# Patient Record
Sex: Female | Born: 1945 | Race: White | Hispanic: No | Marital: Married | Smoking: Current every day smoker
Health system: Southern US, Community
[De-identification: ages and names within clinical notes are randomized; demographics above are authoritative.]

## PROBLEM LIST (undated history)

## (undated) DIAGNOSIS — D1803 Hemangioma of intra-abdominal structures: Secondary | ICD-10-CM

## (undated) DIAGNOSIS — M810 Age-related osteoporosis without current pathological fracture: Secondary | ICD-10-CM

## (undated) DIAGNOSIS — K760 Fatty (change of) liver, not elsewhere classified: Secondary | ICD-10-CM

## (undated) DIAGNOSIS — G500 Trigeminal neuralgia: Secondary | ICD-10-CM

## (undated) DIAGNOSIS — M8580 Other specified disorders of bone density and structure, unspecified site: Secondary | ICD-10-CM

## (undated) DIAGNOSIS — E785 Hyperlipidemia, unspecified: Secondary | ICD-10-CM

## (undated) DIAGNOSIS — K219 Gastro-esophageal reflux disease without esophagitis: Secondary | ICD-10-CM

## (undated) DIAGNOSIS — F419 Anxiety disorder, unspecified: Secondary | ICD-10-CM

## (undated) DIAGNOSIS — E079 Disorder of thyroid, unspecified: Secondary | ICD-10-CM

## (undated) HISTORY — DX: Age-related osteoporosis without current pathological fracture: M81.0

## (undated) HISTORY — PX: TONSILLECTOMY: SUR1361

## (undated) HISTORY — PX: RETINAL DETACHMENT SURGERY: SHX105

## (undated) HISTORY — PX: CATARACT EXTRACTION: SUR2

## (undated) HISTORY — DX: Gastro-esophageal reflux disease without esophagitis: K21.9

## (undated) HISTORY — PX: CHOLECYSTECTOMY: SHX55

## (undated) HISTORY — PX: ABDOMINAL HYSTERECTOMY: SHX81

## (undated) HISTORY — DX: Hyperlipidemia, unspecified: E78.5

## (undated) HISTORY — DX: Hemangioma of intra-abdominal structures: D18.03

## (undated) HISTORY — DX: Fatty (change of) liver, not elsewhere classified: K76.0

## (undated) HISTORY — DX: Other specified disorders of bone density and structure, unspecified site: M85.80

## (undated) HISTORY — PX: EAR PINNA RECONSTRUCTION W/ RIB GRAFT: SHX1484

## (undated) HISTORY — PX: BREAST REDUCTION SURGERY: SHX8

## (undated) HISTORY — PX: OTHER SURGICAL HISTORY: SHX169

---

## 1995-03-08 DIAGNOSIS — G500 Trigeminal neuralgia: Secondary | ICD-10-CM

## 1995-03-08 HISTORY — DX: Trigeminal neuralgia: G50.0

## 2003-09-16 ENCOUNTER — Encounter (INDEPENDENT_AMBULATORY_CARE_PROVIDER_SITE_OTHER): Payer: Self-pay | Admitting: Family Medicine

## 2005-06-30 ENCOUNTER — Ambulatory Visit: Payer: Self-pay | Admitting: Family Medicine

## 2005-07-14 ENCOUNTER — Ambulatory Visit: Payer: Self-pay | Admitting: Family Medicine

## 2005-08-19 ENCOUNTER — Ambulatory Visit: Payer: Self-pay | Admitting: Family Medicine

## 2005-09-02 ENCOUNTER — Encounter (INDEPENDENT_AMBULATORY_CARE_PROVIDER_SITE_OTHER): Payer: Self-pay | Admitting: Family Medicine

## 2005-09-21 ENCOUNTER — Encounter (INDEPENDENT_AMBULATORY_CARE_PROVIDER_SITE_OTHER): Payer: Self-pay | Admitting: Family Medicine

## 2005-09-22 ENCOUNTER — Ambulatory Visit: Payer: Self-pay | Admitting: Family Medicine

## 2005-10-20 ENCOUNTER — Ambulatory Visit: Payer: Self-pay | Admitting: Family Medicine

## 2006-03-07 HISTORY — PX: COLONOSCOPY: SHX174

## 2006-03-07 HISTORY — PX: ESOPHAGOGASTRODUODENOSCOPY: SHX1529

## 2007-01-25 ENCOUNTER — Telehealth (INDEPENDENT_AMBULATORY_CARE_PROVIDER_SITE_OTHER): Payer: Self-pay | Admitting: *Deleted

## 2007-01-25 ENCOUNTER — Ambulatory Visit: Payer: Self-pay | Admitting: Family Medicine

## 2007-01-25 ENCOUNTER — Encounter (INDEPENDENT_AMBULATORY_CARE_PROVIDER_SITE_OTHER): Payer: Self-pay | Admitting: Internal Medicine

## 2007-01-25 DIAGNOSIS — R5381 Other malaise: Secondary | ICD-10-CM | POA: Insufficient documentation

## 2007-01-25 DIAGNOSIS — R5383 Other fatigue: Secondary | ICD-10-CM

## 2007-01-25 DIAGNOSIS — G500 Trigeminal neuralgia: Secondary | ICD-10-CM | POA: Insufficient documentation

## 2007-01-25 DIAGNOSIS — H269 Unspecified cataract: Secondary | ICD-10-CM

## 2007-01-25 DIAGNOSIS — K219 Gastro-esophageal reflux disease without esophagitis: Secondary | ICD-10-CM

## 2007-01-25 DIAGNOSIS — E785 Hyperlipidemia, unspecified: Secondary | ICD-10-CM | POA: Insufficient documentation

## 2007-01-25 DIAGNOSIS — J309 Allergic rhinitis, unspecified: Secondary | ICD-10-CM | POA: Insufficient documentation

## 2007-01-25 DIAGNOSIS — E039 Hypothyroidism, unspecified: Secondary | ICD-10-CM | POA: Insufficient documentation

## 2007-01-25 DIAGNOSIS — R32 Unspecified urinary incontinence: Secondary | ICD-10-CM | POA: Insufficient documentation

## 2007-01-25 DIAGNOSIS — G43909 Migraine, unspecified, not intractable, without status migrainosus: Secondary | ICD-10-CM | POA: Insufficient documentation

## 2007-01-25 HISTORY — DX: Gastro-esophageal reflux disease without esophagitis: K21.9

## 2007-01-25 LAB — CONVERTED CEMR LAB: LDL Goal: 130 mg/dL

## 2007-01-29 ENCOUNTER — Encounter (INDEPENDENT_AMBULATORY_CARE_PROVIDER_SITE_OTHER): Payer: Self-pay | Admitting: Family Medicine

## 2007-01-30 LAB — CONVERTED CEMR LAB
ALT: 20 units/L (ref 0–35)
Albumin: 4.1 g/dL (ref 3.5–5.2)
Alkaline Phosphatase: 94 units/L (ref 39–117)
BUN: 13 mg/dL (ref 6–23)
CO2: 21 meq/L (ref 19–32)
Calcium: 9 mg/dL (ref 8.4–10.5)
Chloride: 108 meq/L (ref 96–112)
Cholesterol: 245 mg/dL — ABNORMAL HIGH (ref 0–200)
Creatinine, Ser: 0.84 mg/dL (ref 0.40–1.20)
Glucose, Bld: 92 mg/dL (ref 70–99)
HDL: 49 mg/dL (ref >39)
TSH: 1.818 microintl units/mL (ref 0.350–5.50)
Total CHOL/HDL Ratio: 5
Total Protein: 6.5 g/dL (ref 6.0–8.3)
VLDL: 24 mg/dL (ref 0–40)

## 2007-01-31 ENCOUNTER — Telehealth (INDEPENDENT_AMBULATORY_CARE_PROVIDER_SITE_OTHER): Payer: Self-pay | Admitting: *Deleted

## 2007-01-31 ENCOUNTER — Encounter (INDEPENDENT_AMBULATORY_CARE_PROVIDER_SITE_OTHER): Payer: Self-pay | Admitting: Family Medicine

## 2007-02-06 ENCOUNTER — Ambulatory Visit: Payer: Self-pay | Admitting: Gastroenterology

## 2007-02-12 ENCOUNTER — Ambulatory Visit (HOSPITAL_COMMUNITY): Admission: RE | Admit: 2007-02-12 | Discharge: 2007-02-12 | Payer: Self-pay | Admitting: Family Medicine

## 2007-02-13 ENCOUNTER — Ambulatory Visit: Payer: Self-pay | Admitting: Gastroenterology

## 2007-02-13 ENCOUNTER — Ambulatory Visit (HOSPITAL_COMMUNITY): Admission: RE | Admit: 2007-02-13 | Discharge: 2007-02-13 | Payer: Self-pay | Admitting: Gastroenterology

## 2007-02-13 ENCOUNTER — Encounter: Payer: Self-pay | Admitting: Gastroenterology

## 2007-02-13 ENCOUNTER — Encounter (INDEPENDENT_AMBULATORY_CARE_PROVIDER_SITE_OTHER): Payer: Self-pay | Admitting: Family Medicine

## 2007-02-14 ENCOUNTER — Encounter (INDEPENDENT_AMBULATORY_CARE_PROVIDER_SITE_OTHER): Payer: Self-pay | Admitting: Family Medicine

## 2007-02-14 ENCOUNTER — Ambulatory Visit (HOSPITAL_COMMUNITY): Admission: RE | Admit: 2007-02-14 | Discharge: 2007-02-14 | Payer: Self-pay | Admitting: Family Medicine

## 2007-02-15 ENCOUNTER — Ambulatory Visit: Payer: Self-pay | Admitting: Family Medicine

## 2007-02-15 ENCOUNTER — Telehealth (INDEPENDENT_AMBULATORY_CARE_PROVIDER_SITE_OTHER): Payer: Self-pay | Admitting: *Deleted

## 2007-02-15 DIAGNOSIS — K2970 Gastritis, unspecified, without bleeding: Secondary | ICD-10-CM | POA: Insufficient documentation

## 2007-02-15 DIAGNOSIS — M899 Disorder of bone, unspecified: Secondary | ICD-10-CM | POA: Insufficient documentation

## 2007-02-15 DIAGNOSIS — K2990 Gastroduodenitis, unspecified, without bleeding: Secondary | ICD-10-CM

## 2007-02-15 DIAGNOSIS — M949 Disorder of cartilage, unspecified: Secondary | ICD-10-CM

## 2007-02-22 ENCOUNTER — Encounter (INDEPENDENT_AMBULATORY_CARE_PROVIDER_SITE_OTHER): Payer: Self-pay | Admitting: Family Medicine

## 2007-02-28 ENCOUNTER — Telehealth (INDEPENDENT_AMBULATORY_CARE_PROVIDER_SITE_OTHER): Payer: Self-pay | Admitting: *Deleted

## 2007-03-05 ENCOUNTER — Encounter (INDEPENDENT_AMBULATORY_CARE_PROVIDER_SITE_OTHER): Payer: Self-pay | Admitting: Family Medicine

## 2007-03-08 HISTORY — PX: COLONOSCOPY WITH ESOPHAGOGASTRODUODENOSCOPY (EGD): SHX5779

## 2007-06-20 ENCOUNTER — Encounter (INDEPENDENT_AMBULATORY_CARE_PROVIDER_SITE_OTHER): Payer: Self-pay | Admitting: Family Medicine

## 2007-08-29 ENCOUNTER — Telehealth (INDEPENDENT_AMBULATORY_CARE_PROVIDER_SITE_OTHER): Payer: Self-pay | Admitting: *Deleted

## 2007-08-30 ENCOUNTER — Ambulatory Visit: Payer: Self-pay | Admitting: Family Medicine

## 2007-08-30 LAB — CONVERTED CEMR LAB: OCCULT 1: POSITIVE

## 2007-09-05 LAB — CONVERTED CEMR LAB: Tissue Transglutaminase Ab, IgA: 0.1 units (ref <7)

## 2007-11-05 ENCOUNTER — Ambulatory Visit: Payer: Self-pay | Admitting: Family Medicine

## 2007-11-05 ENCOUNTER — Encounter: Payer: Self-pay | Admitting: Orthopedic Surgery

## 2007-11-05 ENCOUNTER — Ambulatory Visit (HOSPITAL_COMMUNITY): Admission: RE | Admit: 2007-11-05 | Discharge: 2007-11-05 | Payer: Self-pay | Admitting: Family Medicine

## 2007-11-15 ENCOUNTER — Ambulatory Visit: Payer: Self-pay | Admitting: Gastroenterology

## 2007-11-21 ENCOUNTER — Encounter (INDEPENDENT_AMBULATORY_CARE_PROVIDER_SITE_OTHER): Payer: Self-pay | Admitting: Family Medicine

## 2007-11-28 ENCOUNTER — Ambulatory Visit: Payer: Self-pay | Admitting: Orthopedic Surgery

## 2007-11-28 DIAGNOSIS — M1610 Unilateral primary osteoarthritis, unspecified hip: Secondary | ICD-10-CM | POA: Insufficient documentation

## 2007-11-28 DIAGNOSIS — M169 Osteoarthritis of hip, unspecified: Secondary | ICD-10-CM

## 2007-12-05 ENCOUNTER — Encounter (INDEPENDENT_AMBULATORY_CARE_PROVIDER_SITE_OTHER): Payer: Self-pay | Admitting: Family Medicine

## 2007-12-05 ENCOUNTER — Encounter: Payer: Self-pay | Admitting: Gastroenterology

## 2007-12-05 ENCOUNTER — Ambulatory Visit (HOSPITAL_COMMUNITY): Admission: RE | Admit: 2007-12-05 | Discharge: 2007-12-05 | Payer: Self-pay | Admitting: Gastroenterology

## 2007-12-05 ENCOUNTER — Ambulatory Visit: Payer: Self-pay | Admitting: Gastroenterology

## 2008-01-28 ENCOUNTER — Ambulatory Visit: Payer: Self-pay | Admitting: Family Medicine

## 2008-01-30 LAB — CONVERTED CEMR LAB
ALT: 23 units/L (ref 0–35)
AST: 16 units/L (ref 0–37)
Basophils Absolute: 0 10*3/uL (ref 0.0–0.1)
CO2: 22 meq/L (ref 19–32)
Calcium: 9.6 mg/dL (ref 8.4–10.5)
Cholesterol: 227 mg/dL — ABNORMAL HIGH (ref 0–200)
Glucose, Bld: 101 mg/dL — ABNORMAL HIGH (ref 70–99)
HDL: 45 mg/dL (ref >39)
LDL Cholesterol: 156 mg/dL — ABNORMAL HIGH (ref 0–99)
Lymphocytes Relative: 30 % (ref 12–46)
Lymphs Abs: 1.4 10*3/uL (ref 0.7–4.0)
MCV: 94.1 fL (ref 78.0–100.0)
Monocytes Absolute: 0.3 10*3/uL (ref 0.1–1.0)
Platelets: 186 10*3/uL (ref 150–400)
Potassium: 4.4 meq/L (ref 3.5–5.3)
Total Bilirubin: 0.5 mg/dL (ref 0.3–1.2)
Total CHOL/HDL Ratio: 5
Triglycerides: 131 mg/dL (ref <150)

## 2008-02-07 ENCOUNTER — Ambulatory Visit: Payer: Self-pay | Admitting: Family Medicine

## 2008-03-17 ENCOUNTER — Encounter (INDEPENDENT_AMBULATORY_CARE_PROVIDER_SITE_OTHER): Payer: Self-pay | Admitting: Family Medicine

## 2008-03-20 ENCOUNTER — Encounter (INDEPENDENT_AMBULATORY_CARE_PROVIDER_SITE_OTHER): Payer: Self-pay | Admitting: Family Medicine

## 2008-04-28 ENCOUNTER — Ambulatory Visit: Payer: Self-pay | Admitting: Family Medicine

## 2008-04-28 DIAGNOSIS — H9319 Tinnitus, unspecified ear: Secondary | ICD-10-CM | POA: Insufficient documentation

## 2008-04-28 DIAGNOSIS — Z720 Tobacco use: Secondary | ICD-10-CM | POA: Insufficient documentation

## 2008-04-28 DIAGNOSIS — F17200 Nicotine dependence, unspecified, uncomplicated: Secondary | ICD-10-CM

## 2008-07-24 ENCOUNTER — Ambulatory Visit (HOSPITAL_COMMUNITY): Admission: RE | Admit: 2008-07-24 | Discharge: 2008-07-24 | Payer: Self-pay | Admitting: Family Medicine

## 2008-07-24 ENCOUNTER — Ambulatory Visit: Payer: Self-pay | Admitting: Family Medicine

## 2008-08-07 ENCOUNTER — Ambulatory Visit: Payer: Self-pay | Admitting: Family Medicine

## 2008-08-08 LAB — CONVERTED CEMR LAB
AST: 25 units/L (ref 0–37)
BUN: 8 mg/dL (ref 6–23)
CO2: 24 meq/L (ref 19–32)
Chloride: 107 meq/L (ref 96–112)
Glucose, Bld: 121 mg/dL — ABNORMAL HIGH (ref 70–99)
HDL: 43 mg/dL (ref >39)
Total Bilirubin: 0.3 mg/dL (ref 0.3–1.2)
Total CHOL/HDL Ratio: 4.8

## 2008-08-21 ENCOUNTER — Telehealth (INDEPENDENT_AMBULATORY_CARE_PROVIDER_SITE_OTHER): Payer: Self-pay | Admitting: Family Medicine

## 2008-09-01 ENCOUNTER — Encounter (INDEPENDENT_AMBULATORY_CARE_PROVIDER_SITE_OTHER): Payer: Self-pay | Admitting: Family Medicine

## 2008-09-16 ENCOUNTER — Ambulatory Visit: Payer: Self-pay | Admitting: Family Medicine

## 2008-09-16 DIAGNOSIS — K7581 Nonalcoholic steatohepatitis (NASH): Secondary | ICD-10-CM

## 2008-09-16 DIAGNOSIS — K5289 Other specified noninfective gastroenteritis and colitis: Secondary | ICD-10-CM

## 2008-09-17 ENCOUNTER — Encounter (INDEPENDENT_AMBULATORY_CARE_PROVIDER_SITE_OTHER): Payer: Self-pay | Admitting: Family Medicine

## 2008-09-23 ENCOUNTER — Encounter (INDEPENDENT_AMBULATORY_CARE_PROVIDER_SITE_OTHER): Payer: Self-pay | Admitting: *Deleted

## 2008-09-23 LAB — CONVERTED CEMR LAB
Free T4: 1.18 ng/dL (ref 0.80–1.80)
T3, Free: 2.3 pg/mL (ref 2.3–4.2)

## 2008-10-01 ENCOUNTER — Ambulatory Visit: Payer: Self-pay | Admitting: Family Medicine

## 2008-10-02 ENCOUNTER — Encounter (INDEPENDENT_AMBULATORY_CARE_PROVIDER_SITE_OTHER): Payer: Self-pay | Admitting: Family Medicine

## 2008-10-03 ENCOUNTER — Encounter (INDEPENDENT_AMBULATORY_CARE_PROVIDER_SITE_OTHER): Payer: Self-pay | Admitting: *Deleted

## 2008-10-08 ENCOUNTER — Encounter (INDEPENDENT_AMBULATORY_CARE_PROVIDER_SITE_OTHER): Payer: Self-pay | Admitting: Family Medicine

## 2008-10-08 ENCOUNTER — Ambulatory Visit (HOSPITAL_COMMUNITY): Admission: RE | Admit: 2008-10-08 | Discharge: 2008-10-08 | Payer: Self-pay | Admitting: Family Medicine

## 2008-10-08 LAB — CONVERTED CEMR LAB
ALT: 49 units/L — ABNORMAL HIGH (ref 0–35)
AST: 36 units/L (ref 0–37)
Albumin: 4.2 g/dL (ref 3.5–5.2)
Bilirubin, Direct: 0.1 mg/dL (ref 0.0–0.3)
Indirect Bilirubin: 0.3 mg/dL (ref 0.0–0.9)
Total Bilirubin: 0.4 mg/dL (ref 0.3–1.2)
Total Protein: 6.8 g/dL (ref 6.0–8.3)

## 2008-10-13 ENCOUNTER — Ambulatory Visit (HOSPITAL_COMMUNITY): Admission: RE | Admit: 2008-10-13 | Discharge: 2008-10-13 | Payer: Self-pay | Admitting: Family Medicine

## 2008-10-14 ENCOUNTER — Encounter (INDEPENDENT_AMBULATORY_CARE_PROVIDER_SITE_OTHER): Payer: Self-pay | Admitting: Family Medicine

## 2008-10-21 ENCOUNTER — Encounter (INDEPENDENT_AMBULATORY_CARE_PROVIDER_SITE_OTHER): Payer: Self-pay | Admitting: Family Medicine

## 2008-10-22 ENCOUNTER — Encounter (INDEPENDENT_AMBULATORY_CARE_PROVIDER_SITE_OTHER): Payer: Self-pay | Admitting: Family Medicine

## 2008-11-07 ENCOUNTER — Ambulatory Visit: Payer: Self-pay | Admitting: Family Medicine

## 2008-11-08 ENCOUNTER — Encounter (INDEPENDENT_AMBULATORY_CARE_PROVIDER_SITE_OTHER): Payer: Self-pay | Admitting: Family Medicine

## 2008-11-11 LAB — CONVERTED CEMR LAB
Free T4: 1.17 ng/dL (ref 0.80–1.80)
T3, Free: 2.7 pg/mL (ref 2.3–4.2)
TSH: 0.171 microintl units/mL — ABNORMAL LOW (ref 0.350–4.500)

## 2010-06-12 LAB — CREATININE, SERUM
Creatinine, Ser: 0.77 mg/dL (ref 0.4–1.2)
GFR calc Af Amer: >60 mL/min (ref >60)

## 2010-07-20 NOTE — Consult Note (Signed)
NAME:  Shari Branch, Shari Branch              ACCOUNT NO.:  [id number]   MEDICAL RECORD NO.:  [id number]          PATIENT TYPE:  AMB   LOCATION:  DAY                           FACILITY:  [facility]   PHYSICIAN:  Kassie Mends, M.D.      DATE OF BIRTH:  [birth year]   DATE OF CONSULTATION:  02/06/2007  DATE OF DISCHARGE:                                 CONSULTATION   REASON FOR CONSULTATION:  Needs colonoscopy, history of colitis.   PHYSICIAN REQUESTING CONSULTATION:  Dr. Erby Pian.   HISTORY OF PRESENT ILLNESS:  The patient is a 65 year old Caucasian  female who presents today at the request of Dr. Erby Pian for further  evaluation of history of colitis and to schedule colonoscopy.  She  states she also has chronic GERD.  She says about a year ago she was  hospitalized in [place], [place] with colitis.  She states she was  having left-sided abdominal pain with crampy diarrhea but no blood in  the stool.  She did say at that time, however, she had coffee ground  emesis.  She says off and on chronically she had some vague left-sided  abdominal pain but really no change in her bowel movements with that.  She chronically is constipated.  She has a bowel movement every 3 days  or so.  Denies any blood in the stool.  She has chronic GERD for over 5  years.  She complains of mostly nocturnal symptoms.  She wakes up at  night and has just a few seconds before she vomits.  She sleeps with the  head of her bed elevated.  She complains of dysphagia and odynophagia.  She has never had an EGD or colonoscopy.  She denies any unintentional  weight loss.  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  She reports a significant weight loss initially with the  treatments.  However, she eventually gained a lot of weight with  steroids up 220 pounds from her baseline of 155 pounds.  She is now back  down to 178 but has been a slow  process.   She states she has maternal grandmother who died of colon cancer at age  63.  Her father died at age 35 of pancreatic cancer.  No other history  of chronic GI illnesses, colitis, IBD.   MEDICATIONS:  1. Synthroid 150 mcg daily.  2. Lyrica 100 mg every night.  3. Amitriptyline 200 mg every night.  4. Naproxen p.r.n.  5. Allopurinol p.r.n.  6. Pepcid p.r.n.  7. Tums p.r.n.  8. Ranitidine.   ALLERGIES:  SULFA.   PAST MEDICAL HISTORY:  1. Hyperlipidemia.  2. Chronic GERD.  3. Hypothyroidism.  4. Migraine headaches.  5. 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  6. She has a history of cataract surgery, both eyes, complicated with      retinal detachment requiring surgery as well.  7. History of  trigeminal neuralgia as outlined above.  8. Status post hysterectomy and cholecystectomy.   FAMILY HISTORY:  Mother has Alzheimer's disease.  Father died with  pancreatic cancer at age 71.  She had a sister who had a spontaneous  retinal detachment.  One brother had retinal detachment after cataract  surgery.  Her son had a retinal detachment at age 23, and she had a  granddaughter who had retinal detachment at age 36.  Paternal grandmother  died with colon cancer at age 64.   SOCIAL HISTORY:  She is married and has 3 children.  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  Currently, she works part-  time at [employer].  She smokes half pack of cigarettes daily.  No  alcohol use.   REVIEW OF SYSTEMS:  See HPI for GI, constitutional.  CARDIOPULMONARY:  No chest pain or shortness of breath.    GENITOURINARY:  The patient has urinary urgency and incontinence.   PHYSICAL EXAMINATION:  Weight 178, height 5 feet 8, temperature 97.8,  blood pressure 100/78, pulse 88.  GENERAL:  Pleasant well-nourished, well-developed, Caucasian female in  no acute distress.  SKIN:  Warm and  dry.  No jaundice.  HEENT:  Sclerae nonicteric.  Oropharyngeal mucosa moist and pink.  No  lesions, erythema or exudate.  No lymphadenopathy.  CHEST:  Lungs are clear to auscultation.  CARDIAC EXAM:  Reveals regular rate and rhythm.  Normal S1-S2.  No  murmurs, rubs or gallops.  ABDOMEN:  Positive bowel sounds.  Abdomen soft, nontender, nondistended.  No organomegaly or masses.  No rebound tenderness or guarding.  No  abdominal bruits or hernias.  EXTREMITIES:  No edema.   IMPRESSION:  [name] is a 65 year old lady who presents with a  history of colitis requiring hospitalization approximately one year ago.  She is fairly vague on details, and no records are available.  She  admits to intermittent left-sided abdominal pain associated with chronic  constipation which she has had several years.  She has a second-degree  relative with colon cancer.  She has never had a colonoscopy, therefore  recommend one at this time.  In addition, she has chronic GERD with  dysphagia and odynophagia and may have complications such as esophageal  ring or stricture.  I have discussed risk, alternatives and benefits  with regards to reactions, medications, bleeding, infection, perforation  in the setting of colonoscopy and EGD, and the patient is agreeable to  proceed.   PLAN:  1. EGD and colonoscopy in the near future.  2. Will make recommendations at that time regarding treatment of her      GERD.  Encouraged increased non-caffeinated beverage intake at      least 64 ounces daily and high-fiber diet for treatment of her      constipation.      Tana Coast, P.A.      Kassie Mends, M.D.  Electronically Signed    LL/MEDQ  D:  02/06/2007  T:  02/06/2007  Job:  [id number]   cc:   Franchot Heidelberg, M.D.

## 2010-07-20 NOTE — Consult Note (Signed)
NAME:  Shari Branch, Shari Branch NO.:  [id number]   MEDICAL RECORD NO.:  [id number]          PATIENT TYPE:  AMB   LOCATION:  DAY                           FACILITY:  [facility]   PHYSICIAN:  Kassie Mends, M.D.      DATE OF BIRTH:  1945-09-05   DATE OF CONSULTATION:  11/15/2007  DATE OF DISCHARGE:                                 CONSULTATION   REASON FOR CONSULTATION:  Hemoccult-positive stool, history of  gastritis, poor prep on colonoscopy last year.   PHYSICIAN REQUESTING CONSULTATION:  Franchot Heidelberg, MD   HISTORY OF PRESENT ILLNESS:  The patient is a pleasant 65 year old lady  who presents today for further evaluation of above-stated symptoms at  the request of Dr. Erby Pian.  She was last seen in December 2008 at the  time of EGD and colonoscopy, which was being done for colon cancer  screening and a lifelong history of GERD.  She was noted to have distal  esophageal erosion and ulceration with a patent fibrous ring in the  distal esophagus.  The scope passed easily.  She had diffuse erythema in  the antrum, negative for H. pylori.  She had a large hiatal hernia.  On  colonoscopy, she was noted to have a poor bowel prep with a large amount  of liquid stool still present throughout the colon.  It was recommended  she have a followup colonoscopy this year with a 2-day bowel prep.   She presents today stating that she has had some hematochezia, which she  felt was due to her hemorrhoid.  She continues to have anywhere from  hard stools like rocks.  She states this is due to her IBS.  She has  heartburn and dysphagia to solid foods.  She denies nausea, vomiting, or  melena.  She notes that her daughter was recently diagnosed with celiac  disease, biopsy confirmed 2 months ago.  She is 66 years old.  The  patient has had celiac antibody panel testing per her report, which was  negative.  She denies any abdominal pain.  No weight loss.   CURRENT MEDICATIONS:  1.  Levothyroxine 150 mcg daily.  2. Lyrica 100 mg nightly.  3. Amitriptyline 200 mg nightly.  4. Naproxen p.r.n.  5. Tums p.r.n.  6. Prilosec 20 mg daily.   ALLERGIES:  SULFA causes joint pain.   PAST MEDICAL HISTORY:  1. Hyperlipidemia.  2. Chronic GERD.  3. Hypothyroidism.  4. Migraine headaches.  5. 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  She does not hear out of the right ear.  6. History of cataract surgery both eyes, complicated with retinal      detachment requiring surgery as well.  7. Hysterectomy.  8. Cholecystectomy.  9. She has a history of trigeminal neuralgia secondary to tumor, which      required Cyberknife therapy at [place], [place].   FAMILY HISTORY:  Mother has Alzheimer disease.  Father died with  pancreatic cancer at age 66.  Family history is significant for  spontaenous retinal detachment  in a sister, granddaughter, and son, and  her brother had retinal detachment after cataract surgery.  Paternal  grandmother died of colon cancer at age 74.   SOCIAL HISTORY:  She is married with 3 children.  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  She works full time at [employer].  Smokes half pack of cigarettes daily.  No alcohol use.   REVIEW OF SYSTEMS:  See HPI for GI.  Denies any unintentional weight  loss.  CARDIOPULMONARY:  No chest pain, shortness of breath,  palpitations, or cough.  GENITOURINARY:  No dysuria or hematuria.   PHYSICAL EXAMINATION:  VITAL SIGNS:  Weight 175.5, height 5 feet 8  inches, temp 98.3, blood pressure 130/88, and pulse 88.  GENERAL:  Pleasant, well-nourished, well-developed, Caucasian female in  no acute distress.  SKIN:  Warm and dry.  No jaundice.  HEENT:  Sclera nonicteric.  Oropharyngeal mucosa moist and pink.  No  lesions, erythema, or exudate.  CHEST:  Lungs are clear to auscultation.  CARDIAC:  Regular rate and rhythm.  Normal S1 and S2.  No murmurs,  rubs,  or gallops.  ABDOMEN:  Positive bowel sounds.  Abdomen is soft, nontender, and  nondistended.  No organomegaly or masses.  No rebound or guarding.  No  abdominal bruits or hernias.  LOWER EXTREMITIES:  No edema.   IMPRESSION:  [name] is a 65 year old lady with recent episode of  hematochezia as well as Hemoccult-positive stool.  She had a poor bowel  prep last year and needs to have a 2-day prep and hopes to have a  successful colonoscopy.  She also returns with refractory  gastroesophageal reflux disease on Prilosec 20 mg daily with history of  reflux esophagitis on last esophagogastroduodenoscopy.  She also has  complained dysphagia now and does have a history of fibrous ring noted  at her last esophagogastroduodenoscopy as well.  She may need to have  esophageal dilation.  I have discussed risks, alternatives, and benefits  with regards to risk of reaction to medication, bleeding, infection, and  perforation with both esophagogastroduodenoscopy and colonoscopy, and  she is agreeable to proceed.   PLAN:  1. EGD and colonoscopy with Dr. Kassie Mends.  2. She will be on clear-liquid diet for 2 full days and receive a 2-      day bowel prep.  3. Continue Prilosec for now.  4. Further recommendations to follow.   I would like to thank Dr. Erby Pian for allowing [place] to take part in the  care of this patient.      Tana Coast, P.A.      Kassie Mends, M.D.  Electronically Signed    LL/MEDQ  D:  11/15/2007  T:  11/16/2007  Job:  [id number]   cc:   Kassie Mends, M.D.  [address]  [place] , [place] [address]   Franchot Heidelberg, M.D.

## 2010-07-20 NOTE — Op Note (Signed)
NAME:  Shari Branch, Shari Branch NO.:  [id number]   MEDICAL RECORD NO.:  [id number]          PATIENT TYPE:  AMB   LOCATION:  DAY                           FACILITY:  [facility]   PHYSICIAN:  Kassie Mends, M.D.      DATE OF BIRTH:  27-Sep-1945   DATE OF PROCEDURE:  DATE OF DISCHARGE:                               OPERATIVE REPORT   REFERRING Katheren Jimmerson:  Franchot Heidelberg, M.D.   PROCEDURE:  1. Ileocolonoscopy.  2. Esophagogastroduodenoscopy with cold forceps biopsy and Savary      dilation to 16 mm.   INDICATION FOR EXAM:  [name] is a 65 year old female who presents  with rectal bleeding.  She also has solid dysphasia and history of  gastroesophageal reflux disease.   FINDINGS:  1. Tortuous colon.  Otherwise no polyps, masses, inflammatory changes,      diverticular AVMs seen.  Good bowel prep.  2. Normal terminal ileum, approximately 5 cm visualized.  3. Many erosions seen in the distal esophagus associated with fibrous      ring.  The distal esophagus was dilated to 16 mm with Savary      dilator.  4. Small nodule seen in the cardia, just below the Z-line.  Biopsies      obtained via cold forceps.  Streaky erythema in the antrum without      erosion.  Biopsies obtained via cold forceps evaluated for H.      pylori gastritis.  5. Normal duodenal bulb and second portion of the duodenum.  6. Small internal hemorrhoids otherwise normal retroflexed view of the      rectum.   DIAGNOSES:  1. Distal fibrous ring.  2. Reflux esophagitis.  3. Nodule in the cardia.  4. Moderate gastritis.  5. Small internal hemorrhoids   RECOMMENDATIONS:  1. Follow up in 3 months with Dr. Cira Servant for dysphagia.  She is to      increase her Prilosec to 20 mg, 30 minutes prior to breakfast and      supper.  She is given the [facility] handout and lifestyle      modifications for reflux disease.  She should stop smoking.  2. She is to follow high fiber diet.  She is given handout on high-     fiber diet and gastric irritants.  3. No aspirin, or NSAIDs for 30 days. No anticoagulation for 7 days   MEDICATIONS:  1. Demerol 75 mg IV.  2. Versed 5 mg IV.   PROCEDURE TECHNIQUE:  Physical exam was performed.  Informed consent was  obtained from the patient, and I explained the benefits, risks, and  alternatives to the procedure.  The patient connected to monitor, placed  in left lateral position.  Continuous oxygen was provided by nasal  cannula IV medicine administered through an indwelling cannula.  After  administration of sedation and rectal exam, the patient's rectum was  intubated and the scope was advanced under direct visualization to the  distal terminal ileum.  The scope was removed slowly by carefully  examining the color, texture, anatomy, and integrity mucosa on the way  out.   After the colonoscopy, the patient's esophagus was intubated with the  diagnostic gastroscope.  The scope was advanced under direct  visualization to the second portion of the duodenum.  The scope was  removed slowly by carefully examining the color, texture, anatomy, and  integrity mucosa on the way out.  Prior to withdrawal of the scope,  biopsies were performed and Savary guidewire was introduced.  The Savary  dilators were introduced over the guidewire from 12.8 mm-16 mm.  The  dilators passed with mild to moderate resistance.  The patient was  recovered in endoscopy.  After the guidewire and dilator were removed.  She was discharged to home in satisfactory condition.   PATH:  Cronic gastritis.      Kassie Mends, M.D.  Electronically Signed     SM/MEDQ  D:  12/05/2007  T:  12/05/2007  Job:  [id number]   cc:   Franchot Heidelberg, M.D.

## 2010-07-20 NOTE — Op Note (Signed)
NAME:  Shari Branch, Shari Branch NO.:  [id number]   MEDICAL RECORD NO.:  [id number]          PATIENT TYPE:  AMB   LOCATION:  DAY                           FACILITY:  [facility]   PHYSICIAN:  Kassie Mends, M.D.      DATE OF BIRTH:  02/07/1946   DATE OF PROCEDURE:  DATE OF DISCHARGE:                               OPERATIVE REPORT   REFERRING PHYSICIAN:  Franchot Heidelberg, M.D.   PROCEDURE:  1. Colonoscopy.  2. Esophagoscopy with gold forceps biopsy.   INDICATIONS FOR EXAM:  Shari Branch is a 65 year old female who presents  for colon cancer screening.  She had a second-degree relative who died  of colon cancer at age 74.  She also complains of lifelong history of  gastroesophageal reflux disease but is on Pepcid and ranitidine.  She  complains of pain with swallowing and solid dysphagia.   FINDINGS:  1. Large amount of liquid stool seen throughout the colon with      particulate matter.  The particulate matter clogged the scope so it      could not be adequately aspirated.  Polyps less than 1 cm would      have been easily missed.  Otherwise, no masses, inflammatory      changes, AVMs or diverticula.  2. Normal retroflex view of the rectum.  3. Distal esophagus with erosion and ulceration.  Patent fibrous ring      in the distal esophagus. Scope passed easily.  4. Diffuse erythema in the antrum without erosion or ulceration.      Biopsies obtained via gold forceps to evaluate for H. pylori.  5. Normal duodenal bulb and second portion of the duodenum.  6. Large hiatal hernia.   DIAGNOSES:  1. Reflux esophagus, inadequately treated with Pepcid and ranitidine.  2. Gastritis.  3. Poor bowel prep.   RECOMMENDATIONS:  1. She should resume her previous diet but avoid gastric irritants.      She was given a handout on gastric irritants, gastroesophageal      reflux disease, gastritis, and a hiatal hernia.  2. She should begin Zegerid daily.  3. Will call her with the results  of her biopsies from the antrum.  4. She should avoid aspirin and NSAIDs for 30 days.  No      anticoagulation for 7 days.  5. She should have a repeat colonoscopy within the next year with a 2-      day bowel prep using polyethylene glycol.  6. The patient should have a follow-up appointment in 8 weeks with Dr.      Cira Servant to reassess her dysphagia.  She may need a repeat EGD for      dilation but would prefer to dilate after her distal esophagus has      healed.  7. She should eat meat that is chopped or ground.   MEDICATIONS:  1. Demerol 50 mg IV.  2. Versed 7 mg IV.   PROCEDURE TECHNIQUE:  Physical exam was performed after informed consent  was obtained from the patient after explaining the benefits, risks and  alternatives  to the procedure.  The patient was connected to the monitor  and placed in the left lateral position.  Continuous oxygen was provided  by nasal cannula and IV medicine administered through an indwelling  cannula.  After administration of sedation and rectal exam, the  patient's rectum was intubated and the scope was advanced under direct  visualization to the cecum.  Complete visualization of the mucosa was  impossible due to a large amount of particulate matter and liquid stool.  The scope was  removed by careful exam of the color, texture anatomy and  integrity of the mucosa on the way out.   After the colonoscopy, the patient's esophagus was intubated with the  diagnostic  gastroscope and the scope was advanced under direct  visualization to the second portion of the duodenum.  The lumen of the  esophagus appeared to be at least 15 mm.  The scope was removed slowly  by carefully examining the color, texture, anatomy and integrity of the  mucosa on the way out.  The patient was recovered in endoscopy and  discharged home in satisfactory condition.   ADDENDDUM:  Biopsies show gastritis. No H. pylori.      Kassie Mends, M.D.  Electronically  Signed     SM/MEDQ  D:  02/13/2007  T:  02/13/2007  Job:  [id number]   cc:   Franchot Heidelberg, M.D.

## 2011-01-29 ENCOUNTER — Emergency Department (HOSPITAL_COMMUNITY)
Admission: EM | Admit: 2011-01-29 | Discharge: 2011-01-29 | Disposition: A | Discharge status: Home / Self Care | Payer: Medicare Other | Attending: Emergency Medicine | Admitting: Emergency Medicine

## 2011-01-29 ENCOUNTER — Encounter: Payer: Self-pay | Admitting: *Deleted

## 2011-01-29 ENCOUNTER — Emergency Department (HOSPITAL_COMMUNITY): Payer: Medicare Other

## 2011-01-29 ENCOUNTER — Other Ambulatory Visit: Payer: Self-pay

## 2011-01-29 DIAGNOSIS — J40 Bronchitis, not specified as acute or chronic: Secondary | ICD-10-CM

## 2011-01-29 DIAGNOSIS — F17200 Nicotine dependence, unspecified, uncomplicated: Secondary | ICD-10-CM | POA: Insufficient documentation

## 2011-01-29 DIAGNOSIS — E079 Disorder of thyroid, unspecified: Secondary | ICD-10-CM | POA: Insufficient documentation

## 2011-01-29 DIAGNOSIS — R079 Chest pain, unspecified: Secondary | ICD-10-CM | POA: Insufficient documentation

## 2011-01-29 HISTORY — DX: Disorder of thyroid, unspecified: E07.9

## 2011-01-29 HISTORY — DX: Trigeminal neuralgia: G50.0

## 2011-01-29 LAB — CBC
HCT: 38.9 % (ref 36.0–46.0)
MCH: 32 pg (ref 26.0–34.0)
MCHC: 34.7 g/dL (ref 30.0–36.0)
MCV: 92.2 fL (ref 78.0–100.0)
RDW: 12.9 % (ref 11.5–15.5)

## 2011-01-29 LAB — DIFFERENTIAL
Basophils Absolute: 0 10*3/uL (ref 0.0–0.1)
Basophils Relative: 0 % (ref 0–1)
Lymphocytes Relative: 25 % (ref 12–46)
Lymphs Abs: 1.5 10*3/uL (ref 0.7–4.0)
Monocytes Absolute: 0.3 10*3/uL (ref 0.1–1.0)
Neutro Abs: 4.2 10*3/uL (ref 1.7–7.7)
Neutrophils Relative %: 69 % (ref 43–77)

## 2011-01-29 LAB — BASIC METABOLIC PANEL: GFR calc Af Amer: >90 mL/min (ref >90)

## 2011-01-29 LAB — POCT I-STAT TROPONIN I: Troponin i, poc: 0 ng/mL (ref 0.00–0.08)

## 2011-01-29 MED ORDER — ASPIRIN 81 MG PO CHEW
324.0000 mg | CHEWABLE_TABLET | Freq: Once | ORAL | Status: AC
  Administered 2011-01-29: 324 mg via ORAL
  Filled 2011-01-29: qty 4

## 2011-01-29 MED ORDER — AZITHROMYCIN 250 MG PO TABS: 250.0000 mg | ORAL_TABLET | Freq: Every day | ORAL | Status: AC

## 2011-01-29 MED ORDER — HYDROCODONE-ACETAMINOPHEN 5-325 MG PO TABS
1.0000 | ORAL_TABLET | Freq: Once | ORAL | Status: AC
  Administered 2011-01-29: 1 via ORAL
  Filled 2011-01-29: qty 1

## 2011-01-29 MED ORDER — AZITHROMYCIN 250 MG PO TABS
500.0000 mg | ORAL_TABLET | Freq: Once | ORAL | Status: AC
  Administered 2011-01-29: 500 mg via ORAL
  Filled 2011-01-29: qty 2

## 2011-01-29 MED ORDER — NITROGLYCERIN 0.4 MG SL SUBL
0.4000 mg | SUBLINGUAL_TABLET | SUBLINGUAL | Status: DC | PRN
  Administered 2011-01-29: 0.4 mg via SUBLINGUAL
  Filled 2011-01-29: qty 25

## 2011-01-29 NOTE — ED Notes (Signed)
Pt states she began having cp while at work today at [employer]. Pt states pain radiates to left arm and back.

## 2011-01-29 NOTE — ED Notes (Signed)
Pt presently resting quietly.  VS improved.  Pt denies any CP or SOB at present.  Reports only "occasional twinge". Denies headache from nitro administration.

## 2011-01-29 NOTE — ED Provider Notes (Signed)
History     CSN: [id number] Arrival date & time: 01/29/2011  5:33 PM   First MD Initiated Contact with Patient 01/29/11 1741      Chief Complaint  Patient presents with  . Chest Pain    (Consider location/radiation/quality/duration/timing/severity/associated sxs/prior treatment) HPI Comments: Shari Branch is a 65 y.o. female who presents to the Emergency Department complaining of chest pain that began while at work at [employer] today. Pain began in the left chest and then radiated to the left shoulder blade and up her jaw. Pain an onset was a 9/10. She continued working despite the pain, went home, and then came to the ER. She has taken no medicines to relieve the pain. Nothing she has done makes the pain worse. Currently pain is a 6/10. No longer having jaw pain. Pain remains in left chest and into left shoulder blade. Denies fever, chills, shortness of breath, nausea, vomiting. She reports a recent URI with cough and congestion that resolved a week ago.  Patient is a 65 y.o. female presenting with chest pain.  Chest Pain     Past Medical History  Diagnosis Date  . Thyroid disease   . Trigeminal neuralgia     Past Surgical History  Procedure Date  . Tonsillectomy   . Abdominal hysterectomy   . Cholecystectomy     No family history on file.  History  Substance Use Topics  . Smoking status: Current Everyday Smoker -- 1.5 packs/day  . Smokeless tobacco: Not on file  . Alcohol Use: No    OB History    Grav Para Term Preterm Abortions TAB SAB Ect Mult Living                  Review of Systems  Cardiovascular: Positive for chest pain.  A 10 review of systems reviewed and are negative for acute change except as noted in the HPI.  Allergies  Sulfonamide derivatives  Home Medications  No current outpatient prescriptions on file.  BP 103/70  Pulse 90  Temp(Src) 98 F (36.7 C) (Oral)  Resp 17  Ht 5\' 8"  (1.727 m)  Wt 160 lb (72.576 kg)  BMI 24.33 kg/m2  SpO2  95%  Physical Exam  Nursing note and vitals reviewed. Constitutional: She is oriented to person, place, and time. She appears well-developed and well-nourished. No distress.  HENT:  Head: Normocephalic and atraumatic.  Eyes: EOM are normal.  Neck: Normal range of motion.  Cardiovascular: Normal rate, normal heart sounds and intact distal pulses.   Pulmonary/Chest: Effort normal. No respiratory distress. She exhibits no tenderness.       Rales at left base otherwise clear  Abdominal: Soft.  Musculoskeletal: Normal range of motion.  Neurological: She is alert and oriented to person, place, and time. She has normal reflexes.  Skin: Skin is warm and dry.    ED Course  Procedures (including critical care time) Results for orders placed during the hospital encounter of 01/29/11  CBC      Component Value Range   WBC 6.1  4.0 - 10.5 (K/uL)   RBC 4.22  3.87 - 5.11 (MIL/uL)   Hemoglobin 13.5  12.0 - 15.0 (g/dL)   HCT 56.2  13.0 - 86.5 (%)   MCV 92.2  78.0 - 100.0 (fL)   MCH 32.0  26.0 - 34.0 (pg)   MCHC 34.7  30.0 - 36.0 (g/dL)   RDW 78.4  69.6 - 29.5 (%)   Platelets 164  150 - 400 (K/uL)  DIFFERENTIAL      Component Value Range   Neutrophils Relative 69  43 - 77 (%)   Neutro Abs 4.2  1.7 - 7.7 (K/uL)   Lymphocytes Relative 25  12 - 46 (%)   Lymphs Abs 1.5  0.7 - 4.0 (K/uL)   Monocytes Relative 5  3 - 12 (%)   Monocytes Absolute 0.3  0.1 - 1.0 (K/uL)   Eosinophils Relative 2  0 - 5 (%)   Eosinophils Absolute 0.1  0.0 - 0.7 (K/uL)   Basophils Relative 0  0 - 1 (%)   Basophils Absolute 0.0  0.0 - 0.1 (K/uL)  BASIC METABOLIC PANEL      Component Value Range   Sodium 135  135 - 145 (mEq/L)   Potassium 3.5  3.5 - 5.1 (mEq/L)   Chloride 102  96 - 112 (mEq/L)   CO2 25  19 - 32 (mEq/L)   Glucose, Bld 106 (*) 70 - 99 (mg/dL)   BUN 9  6 - 23 (mg/dL)   Creatinine, Ser 1.61  0.50 - 1.10 (mg/dL)   Calcium 9.5  8.4 - 09.6 (mg/dL)   GFR calc non Af Amer 89 (*) >90 (mL/min)   GFR calc Af  Amer >90  >90 (mL/min)  POCT I-STAT TROPONIN I      Component Value Range   Troponin i, poc 0.00  0.00 - 0.08 (ng/mL)   Comment 3           Dg Chest Port 1 View  01/29/2011  *RADIOLOGY REPORT*  Clinical Data: Chest pain dizziness  PORTABLE CHEST - 1 VIEW  Comparison: Portable exam 1929 hours without priors for comparison.  Findings: Normal heart size and pulmonary vascularity. Tortuous aorta. Peribronchial thickening with minimal bibasilar atelectasis. Remaining lungs clear. No pleural effusion or pneumothorax. Bones demineralized.  IMPRESSION: Bronchitic changes with minimal bibasilar atelectasis.  Original Report Authenticated By: Lollie Marrow, M.D.    Date: 01/29/2011  1745  Rate: 98  Rhythm: normal sinus rhythm  QRS Axis: normal  Intervals: normal  ST/T Wave abnormalities: normal  Conduction Disutrbances:none  Narrative Interpretation:   Old EKG Reviewed: none available     MDM  Patient with left sided chest pain with radiation to the left shoulder blade and into her jaw while at work today. Ekg is normal. Cardiac marker negative. Pain relieved with a single NTG. Given ASA. Xray with resolving bronchitis. Patient states that pain can be reproduced if she takes a real deep breath. Began antibiotic for bronchitis ang Rx for analgesics.  MDM Reviewed: nursing note and vitals Interpretation: labs, ECG and x-ray          Nicoletta Dress. Colon Branch, MD 01/29/11 2052

## 2011-02-21 ENCOUNTER — Other Ambulatory Visit: Payer: Self-pay | Admitting: Family Medicine

## 2011-02-22 ENCOUNTER — Ambulatory Visit (HOSPITAL_COMMUNITY)
Admission: RE | Admit: 2011-02-22 | Discharge: 2011-02-22 | Disposition: A | Discharge status: Home / Self Care | Payer: Medicare Other | Source: Ambulatory Visit | Attending: Family Medicine | Admitting: Family Medicine

## 2011-02-22 DIAGNOSIS — R079 Chest pain, unspecified: Secondary | ICD-10-CM | POA: Insufficient documentation

## 2011-02-22 MED ORDER — IOHEXOL 350 MG/ML SOLN
100.0000 mL | Freq: Once | INTRAVENOUS | Status: AC | PRN
  Administered 2011-02-22: 100 mL via INTRAVENOUS

## 2011-06-30 ENCOUNTER — Encounter (HOSPITAL_BASED_OUTPATIENT_CLINIC_OR_DEPARTMENT_OTHER): Admission: RE | Payer: Self-pay | Source: Ambulatory Visit

## 2011-06-30 ENCOUNTER — Ambulatory Visit (HOSPITAL_BASED_OUTPATIENT_CLINIC_OR_DEPARTMENT_OTHER): Admission: RE | Admit: 2011-06-30 | Payer: Medicare Other | Source: Ambulatory Visit | Admitting: Plastic Surgery

## 2011-06-30 SURGERY — BLEPHAROPLASTY
Anesthesia: General | Laterality: Bilateral

## 2011-07-05 DIAGNOSIS — E039 Hypothyroidism, unspecified: Secondary | ICD-10-CM | POA: Diagnosis not present

## 2011-07-25 DIAGNOSIS — Z1231 Encounter for screening mammogram for malignant neoplasm of breast: Secondary | ICD-10-CM | POA: Diagnosis not present

## 2011-07-29 DIAGNOSIS — N63 Unspecified lump in unspecified breast: Secondary | ICD-10-CM | POA: Diagnosis not present

## 2011-07-29 DIAGNOSIS — N6489 Other specified disorders of breast: Secondary | ICD-10-CM | POA: Diagnosis not present

## 2011-10-04 DIAGNOSIS — E039 Hypothyroidism, unspecified: Secondary | ICD-10-CM | POA: Diagnosis not present

## 2011-10-10 DIAGNOSIS — H26499 Other secondary cataract, unspecified eye: Secondary | ICD-10-CM | POA: Diagnosis not present

## 2011-12-19 DIAGNOSIS — E039 Hypothyroidism, unspecified: Secondary | ICD-10-CM | POA: Diagnosis not present

## 2011-12-19 DIAGNOSIS — Z23 Encounter for immunization: Secondary | ICD-10-CM | POA: Diagnosis not present

## 2011-12-19 DIAGNOSIS — G500 Trigeminal neuralgia: Secondary | ICD-10-CM | POA: Diagnosis not present

## 2012-01-04 DIAGNOSIS — G43009 Migraine without aura, not intractable, without status migrainosus: Secondary | ICD-10-CM | POA: Diagnosis not present

## 2012-01-04 DIAGNOSIS — G500 Trigeminal neuralgia: Secondary | ICD-10-CM | POA: Diagnosis not present

## 2012-01-04 DIAGNOSIS — D320 Benign neoplasm of cerebral meninges: Secondary | ICD-10-CM | POA: Diagnosis not present

## 2012-01-05 ENCOUNTER — Other Ambulatory Visit: Payer: Self-pay | Admitting: Neurology

## 2012-01-05 DIAGNOSIS — G43009 Migraine without aura, not intractable, without status migrainosus: Secondary | ICD-10-CM

## 2012-01-05 DIAGNOSIS — G500 Trigeminal neuralgia: Secondary | ICD-10-CM

## 2012-01-05 DIAGNOSIS — D320 Benign neoplasm of cerebral meninges: Secondary | ICD-10-CM

## 2012-03-05 DIAGNOSIS — B354 Tinea corporis: Secondary | ICD-10-CM | POA: Diagnosis not present

## 2012-05-07 DIAGNOSIS — L439 Lichen planus, unspecified: Secondary | ICD-10-CM | POA: Diagnosis not present

## 2012-05-07 DIAGNOSIS — L538 Other specified erythematous conditions: Secondary | ICD-10-CM | POA: Diagnosis not present

## 2012-05-09 DIAGNOSIS — E039 Hypothyroidism, unspecified: Secondary | ICD-10-CM | POA: Diagnosis not present

## 2012-06-14 ENCOUNTER — Encounter: Payer: Self-pay | Admitting: Family Medicine

## 2012-06-14 ENCOUNTER — Ambulatory Visit (INDEPENDENT_AMBULATORY_CARE_PROVIDER_SITE_OTHER): Payer: Medicare Other | Admitting: Family Medicine

## 2012-06-14 VITALS — BP 110/68 | HR 70 | Temp 97.7°F | Resp 16 | Wt 172.0 lb

## 2012-06-14 DIAGNOSIS — B029 Zoster without complications: Secondary | ICD-10-CM

## 2012-06-14 DIAGNOSIS — L439 Lichen planus, unspecified: Secondary | ICD-10-CM | POA: Insufficient documentation

## 2012-06-14 DIAGNOSIS — L920 Granuloma annulare: Secondary | ICD-10-CM | POA: Insufficient documentation

## 2012-06-14 MED ORDER — HYDROCODONE-ACETAMINOPHEN 5-325 MG PO TABS: 1.0000 | ORAL_TABLET | Freq: Four times a day (QID) | ORAL | Status: DC | PRN

## 2012-06-14 MED ORDER — VALACYCLOVIR HCL 1 G PO TABS: 1000.0000 mg | ORAL_TABLET | Freq: Three times a day (TID) | ORAL | Status: DC

## 2012-06-14 MED ORDER — PREGABALIN 75 MG PO CAPS: 75.0000 mg | ORAL_CAPSULE | Freq: Three times a day (TID) | ORAL | Status: DC

## 2012-06-14 NOTE — Progress Notes (Signed)
  Subjective:    Patient ID: Shari Branch, female    DOB: January 07, 1946, 67 y.o.   MRN: [id number]  HPI  2 day history of a painful rash on the right inner arm. The rash runs from the posterior right shoulder down to the tips of her fingers. The rash consists of erythematous papules that are clustered into plaques in a linear distribution following a dermatomal pattern.  The rash is intensely painful and feels like pins and needles.  She had chickenpox as a child.  Past Medical History  Diagnosis Date  . Thyroid disease   . Trigeminal neuralgia    Current Outpatient Prescriptions on File Prior to Visit  Medication Sig Dispense Refill  . amitriptyline (ELAVIL) 50 MG tablet Take 200 mg by mouth at bedtime.        Marland Kitchen levothyroxine (SYNTHROID, LEVOTHROID) 137 MCG tablet Take 137 mcg by mouth daily.        . naproxen (NAPROSYN) 500 MG tablet Take 500 mg by mouth 2 (two) times daily with a meal.        . pantoprazole (PROTONIX) 40 MG tablet Take 40 mg by mouth 2 (two) times daily.         No current facility-administered medications on file prior to visit.   History   Social History  . Marital Status: Married    Spouse Name: N/A    Number of Children: N/A  . Years of Education: N/A   Occupational History  . Not on file.   Social History Main Topics  . Smoking status: Current Every Day Smoker -- 1.50 packs/day  . Smokeless tobacco: Not on file  . Alcohol Use: No  . Drug Use: No  . Sexually Active:    Other Topics Concern  . Not on file   Social History Narrative  . No narrative on file     Review of Systems    remainder of review of systems is negative Objective:   Physical Exam  Constitutional: She appears well-developed and well-nourished.  Cardiovascular: Normal rate, regular rhythm, normal heart sounds and intact distal pulses.   No murmur heard. Pulmonary/Chest: Effort normal and breath sounds normal. No respiratory distress. She has no wheezes. She has no rales.   Abdominal: Soft. Bowel sounds are normal.  Skin: Rash noted.      A red erythematous papular rash which is sometimes coalescent into plaques on the medial right arm from the shoulder to the hand.  It is following a dermatomal pattern.    Assessment & Plan:  1. Herpes zoster Valtrex 1 g by mouth 3 times a day for 7 days Lyrica 75 mg by mouth 3 times a day when necessary nerve pain Norco 5/325 as one by mouth every 6 hours when necessary breakthrough pain.

## 2012-06-20 ENCOUNTER — Other Ambulatory Visit: Payer: Self-pay | Admitting: Family Medicine

## 2012-06-25 ENCOUNTER — Telehealth: Payer: Self-pay | Admitting: Family Medicine

## 2012-06-25 MED ORDER — IBUPROFEN 800 MG PO TABS: 800.0000 mg | ORAL_TABLET | Freq: Three times a day (TID) | ORAL | Status: DC | PRN

## 2012-06-25 NOTE — Telephone Encounter (Signed)
Rx Refilled  

## 2012-07-01 ENCOUNTER — Other Ambulatory Visit: Payer: Self-pay | Admitting: Family Medicine

## 2012-07-02 NOTE — Telephone Encounter (Signed)
Ok to refill 

## 2012-07-02 NOTE — Telephone Encounter (Signed)
?   OK to Refill  

## 2012-07-20 ENCOUNTER — Other Ambulatory Visit: Payer: Self-pay | Admitting: Family Medicine

## 2012-07-31 ENCOUNTER — Encounter: Payer: Self-pay | Admitting: Family Medicine

## 2012-07-31 ENCOUNTER — Ambulatory Visit (INDEPENDENT_AMBULATORY_CARE_PROVIDER_SITE_OTHER): Payer: Medicare Other | Admitting: Family Medicine

## 2012-07-31 VITALS — BP 100/62 | HR 102 | Temp 98.0°F | Resp 18 | Wt 166.0 lb

## 2012-07-31 DIAGNOSIS — G5600 Carpal tunnel syndrome, unspecified upper limb: Secondary | ICD-10-CM

## 2012-07-31 DIAGNOSIS — G5601 Carpal tunnel syndrome, right upper limb: Secondary | ICD-10-CM

## 2012-07-31 MED ORDER — NAPROXEN 500 MG PO TABS: 500.0000 mg | ORAL_TABLET | Freq: Two times a day (BID) | ORAL | Status: DC

## 2012-07-31 NOTE — Progress Notes (Signed)
  Subjective:    Patient ID: Shari Branch, female    DOB: 04-Nov-1945, 67 y.o.   MRN: [id number]  HPI Patient reports severe pain in her right wrist on the volar aspect x1 month. She also complains of electrical shock like pain shooting from her wrist into her thumb and index finger with certain motions. She also complains of numbness and tingling in her fingers along with weakness in her hand. She is also reporting some swelling in the wrist. Just as a painful bunion on the right medial first MTP joint. Past Medical History  Diagnosis Date  . Thyroid disease   . Trigeminal neuralgia    Current Outpatient Prescriptions on File Prior to Visit  Medication Sig Dispense Refill  . amitriptyline (ELAVIL) 50 MG tablet TAKE FIVE TABLETS BY MOUTH ONCE DAILY AT BEDTIME  150 tablet  3  . clobetasol cream (TEMOVATE) 0.05 % Apply topically 2 (two) times daily.      Marland Kitchen HYDROcodone-acetaminophen (NORCO/VICODIN) 5-325 MG per tablet TAKE ONE TABLET BY MOUTH EVERY 6 HOURS AS NEEDED FOR PAIN  30 tablet  0  . ibuprofen (ADVIL,MOTRIN) 800 MG tablet Take 1 tablet (800 mg total) by mouth every 8 (eight) hours as needed for pain.  60 tablet  0  . levothyroxine (SYNTHROID, LEVOTHROID) 100 MCG tablet TAKE ONE TABLET BY MOUTH EVERY DAY  30 tablet  5  . levothyroxine (SYNTHROID, LEVOTHROID) 137 MCG tablet Take 137 mcg by mouth daily.        . Naftifine HCl (NAFTIN) 2 % CREA Apply topically.       No current facility-administered medications on file prior to visit.   Allergies  Allergen Reactions  . Sulfonamide Derivatives     REACTION: Joint Pain and locking - walks like a 67 year old   History   Social History  . Marital Status: Married    Spouse Name: N/A    Number of Children: N/A  . Years of Education: N/A   Occupational History  . Not on file.   Social History Main Topics  . Smoking status: Current Every Day Smoker -- 1.50 packs/day  . Smokeless tobacco: Not on file  . Alcohol Use: No  . Drug Use:  No  . Sexually Active:    Other Topics Concern  . Not on file   Social History Narrative  . No narrative on file      Review of Systems  All other systems reviewed and are negative.       Objective:   Physical Exam  Vitals reviewed. Cardiovascular: Normal rate and regular rhythm.   Pulmonary/Chest: Effort normal and breath sounds normal. No respiratory distress.   she has clubbing in her fingertips. She has a positive Tinel sign in the right wrist. She has a positive Phalen sign. She has normal grip strength. She has no wasting of the thenar eminence. She has a tender painful bunion on her right medial first MTP joint.        Assessment & Plan:  1. Carpal tunnel syndrome, right Patient was given a cockup wrist splint with instructions on how to use it. She is to use naproxen 500 mg by mouth twice a day. Symptoms are not better in 3 weeks she is to call me for possibly a cortisone injection. Regards to painful bunion on the right foot. I recommended a bunion pad and the naproxen. If it is no better she'll likely need a referral to podiatry.

## 2012-08-02 ENCOUNTER — Other Ambulatory Visit: Payer: Self-pay | Admitting: Family Medicine

## 2012-08-27 ENCOUNTER — Encounter: Payer: Self-pay | Admitting: Family Medicine

## 2012-08-27 ENCOUNTER — Ambulatory Visit (INDEPENDENT_AMBULATORY_CARE_PROVIDER_SITE_OTHER): Payer: Medicare Other | Admitting: Family Medicine

## 2012-08-27 VITALS — BP 110/80 | HR 86 | Temp 98.2°F | Resp 16 | Wt 166.0 lb

## 2012-08-27 DIAGNOSIS — F411 Generalized anxiety disorder: Secondary | ICD-10-CM | POA: Diagnosis not present

## 2012-08-27 DIAGNOSIS — R079 Chest pain, unspecified: Secondary | ICD-10-CM | POA: Diagnosis not present

## 2012-08-27 LAB — CBC WITH DIFFERENTIAL/PLATELET
Eosinophils Absolute: 0.1 10*3/uL (ref 0.0–0.7)
Eosinophils Relative: 2 % (ref 0–5)
HCT: 39.8 % (ref 36.0–46.0)
Hemoglobin: 13.6 g/dL (ref 12.0–15.0)
Lymphocytes Relative: 35 % (ref 12–46)
Lymphs Abs: 2.1 10*3/uL (ref 0.7–4.0)
MCH: 30.6 pg (ref 26.0–34.0)
MCV: 89.4 fL (ref 78.0–100.0)
Monocytes Relative: 5 % (ref 3–12)
RBC: 4.45 MIL/uL (ref 3.87–5.11)

## 2012-08-27 MED ORDER — ESCITALOPRAM OXALATE 10 MG PO TABS: 10.0000 mg | ORAL_TABLET | Freq: Every day | ORAL | Status: DC

## 2012-08-27 NOTE — Progress Notes (Signed)
Subjective:    Patient ID: Shari Branch, female    DOB: Nov 09, 1945, 67 y.o.   MRN: [id number]  HPI Over the last 2 weeks, the patient has had 2 episodes of left-sided chest pain. The pain begins in her left jaw radiates down her left shoulder and into her left chest. The pain is intense. It lasts possibly 7 minutes. It resolve spontaneously.  There is no shortness of breath. There is no nausea.  The pain does not radiate into her left arm.  The pain is not exertional.  It occurs at rest.  She has a past medical history of panic attacks which cause similar pain in the right side of her chest. The fact the pain is moved to the left side has her concerned.  She denies any recent immobilization or plane flights. She is now taking hormones. She has no family history of DVTs or PEs. She denies any pleurisy. She denies any GERD. She denies any melena. She denies any relation of the pain to food.  Past Medical History  Diagnosis Date  . Thyroid disease   . Trigeminal neuralgia    Current Outpatient Prescriptions on File Prior to Visit  Medication Sig Dispense Refill  . amitriptyline (ELAVIL) 50 MG tablet TAKE FIVE TABLETS BY MOUTH ONCE DAILY AT BEDTIME  150 tablet  3  . clobetasol cream (TEMOVATE) 0.05 % Apply topically 2 (two) times daily.      Marland Kitchen HYDROcodone-acetaminophen (NORCO/VICODIN) 5-325 MG per tablet TAKE ONE TABLET BY MOUTH EVERY 6 HOURS AS NEEDED FOR PAIN  30 tablet  0  . ibuprofen (ADVIL,MOTRIN) 800 MG tablet Take 1 tablet (800 mg total) by mouth every 8 (eight) hours as needed for pain.  60 tablet  0  . levothyroxine (SYNTHROID, LEVOTHROID) 100 MCG tablet TAKE ONE TABLET BY MOUTH EVERY DAY  30 tablet  5  . levothyroxine (SYNTHROID, LEVOTHROID) 137 MCG tablet Take 137 mcg by mouth daily.        . Naftifine HCl (NAFTIN) 2 % CREA Apply topically.      . naproxen (NAPROSYN) 500 MG tablet Take 1 tablet (500 mg total) by mouth 2 (two) times daily with a meal.  60 tablet  0  . pantoprazole  (PROTONIX) 40 MG tablet TAKE ONE TABLET BY MOUTH TWICE DAILY  180 tablet  4   No current facility-administered medications on file prior to visit.   Allergies  Allergen Reactions  . Sulfonamide Derivatives     REACTION: Joint Pain and locking - walks like a 67 year old   History   Social History  . Marital Status: Married    Spouse Name: N/A    Number of Children: N/A  . Years of Education: N/A   Occupational History  . Not on file.   Social History Main Topics  . Smoking status: Current Every Day Smoker -- 1.50 packs/day  . Smokeless tobacco: Not on file  . Alcohol Use: No  . Drug Use: No  . Sexually Active:    Other Topics Concern  . Not on file   Social History Narrative  . No narrative on file      Review of Systems  All other systems reviewed and are negative.       Objective:   Physical Exam  Vitals reviewed. Constitutional: She appears well-developed and well-nourished.  Neck: Neck supple. No JVD present. No thyromegaly present.  Cardiovascular: Normal rate, regular rhythm and normal heart sounds.  Exam reveals no gallop and  no friction rub.   No murmur heard. Pulmonary/Chest: Effort normal and breath sounds normal. No respiratory distress. She has no wheezes. She has no rales. She exhibits no tenderness.  Abdominal: Soft. Bowel sounds are normal. She exhibits no distension and no mass. There is no tenderness. There is no rebound and no guarding.   she has no peripheral edema.  EKG shows normal sinus rhythm at 84 beats per minute with a first degree AV block. There is no ischemia or infarction seen on EKG. She has normal intervals and normal axis.      Assessment & Plan:  1. Chest pain I feel the patient's chest pain is most likely anxiety related particularly given the fact she has similar symptoms on the right side. She now has a gallbladder. It does not seem to be related to food. There is no shortness of breath. I will start the patient on Lexapro  10 mg by mouth daily. In 10 weeks is the medicine kicks in I hope that she notices a decrease in the frequency or absolute cessation of these anxiety attacks.  I will also order chest x-ray to rule out pulmonary pathology given her smoking history. Also consult cardiology for an outpatient stress test to be thorough. - COMPLETE METABOLIC PANEL WITH GFR - CBC with Differential - DG Chest 2 View; Future - EKG 12-Lead

## 2012-08-28 ENCOUNTER — Ambulatory Visit (HOSPITAL_COMMUNITY)
Admission: RE | Admit: 2012-08-28 | Discharge: 2012-08-28 | Disposition: A | Discharge status: Home / Self Care | Payer: Medicare Other | Source: Ambulatory Visit | Attending: Family Medicine | Admitting: Family Medicine

## 2012-08-28 DIAGNOSIS — R079 Chest pain, unspecified: Secondary | ICD-10-CM | POA: Diagnosis not present

## 2012-08-28 LAB — COMPLETE METABOLIC PANEL WITH GFR
ALT: 25 U/L (ref 0–35)
AST: 18 U/L (ref 0–37)
Alkaline Phosphatase: 109 U/L (ref 39–117)
Calcium: 9.7 mg/dL (ref 8.4–10.5)
Chloride: 103 mEq/L (ref 96–112)
Creat: 0.83 mg/dL (ref 0.50–1.10)
Total Bilirubin: 0.4 mg/dL (ref 0.3–1.2)

## 2012-08-28 MED ORDER — ESCITALOPRAM OXALATE 10 MG PO TABS: 10.0000 mg | ORAL_TABLET | Freq: Every day | ORAL | Status: DC

## 2012-08-28 NOTE — Addendum Note (Signed)
Addended by: Lynnea Ferrier on: 08/28/2012 12:16 PM   Modules accepted: Orders

## 2012-09-17 ENCOUNTER — Ambulatory Visit (INDEPENDENT_AMBULATORY_CARE_PROVIDER_SITE_OTHER): Payer: Medicare Other | Admitting: Family Medicine

## 2012-09-17 ENCOUNTER — Encounter: Payer: Self-pay | Admitting: Family Medicine

## 2012-09-17 VITALS — BP 110/80 | HR 78 | Temp 97.0°F | Resp 18 | Wt 166.0 lb

## 2012-09-17 DIAGNOSIS — B9689 Other specified bacterial agents as the cause of diseases classified elsewhere: Secondary | ICD-10-CM | POA: Diagnosis not present

## 2012-09-17 DIAGNOSIS — A499 Bacterial infection, unspecified: Secondary | ICD-10-CM | POA: Diagnosis not present

## 2012-09-17 DIAGNOSIS — H1089 Other conjunctivitis: Secondary | ICD-10-CM

## 2012-09-17 DIAGNOSIS — E039 Hypothyroidism, unspecified: Secondary | ICD-10-CM | POA: Diagnosis not present

## 2012-09-17 DIAGNOSIS — H109 Unspecified conjunctivitis: Secondary | ICD-10-CM | POA: Insufficient documentation

## 2012-09-17 MED ORDER — POLYMYXIN B-TRIMETHOPRIM 10000-0.1 UNIT/ML-% OP SOLN: 1.0000 [drp] | Freq: Four times a day (QID) | OPHTHALMIC | Status: DC

## 2012-09-17 NOTE — Patient Instructions (Signed)
Use the eye drop as prescribed Bacterial Conjunctivitis Bacterial conjunctivitis (commonly called pink eye) is redness, soreness, or puffiness (inflammation) of the white part of your eye. It is caused by a germ called bacteria. These germs can easily spread from person to person (contagious). Your eye often will become red or pink. Your eye may also become irritated, watery, or have a thick discharge.  HOME CARE   Apply a cool, clean washcloth over closed eyelids. Do this for 10 20 minutes, 3 4 times a day while you have pain.  Gently wipe away any fluid coming from the eye with a warm, wet washcloth or cotton ball.  Wash your hands often with soap and water. Use paper towels to dry your hands.  Do not share towels or washcloths.  Change or wash your pillowcase every day.  Do not use eye makeup until the infection is gone.  Do not use machines or drive if your vision is blurry.  Stop using contact lenses. Do not use them again until your doctor says it is okay.  Do not touch the tip of the eye drop bottle or medicine tube with your fingers when you put medicine on the eye. GET HELP RIGHT AWAY IF:   Your eye is not better after 3 days of starting your medicine.  You have a yellowish fluid coming out of the eye.  You have more pain in the eye.  Your eye redness is spreading.  Your vision becomes blurry.  You have a fever or lasting symptoms for more than 2-3 days.  You have a fever and your symptoms suddenly get worse.  You have pain in the face.  Your face gets red or puffy (swollen). MAKE SURE YOU:   Understand these instructions.  Will watch this condition.  Will get help right away if you are not doing well or get worse. Document Released: 12/01/2007 Document Revised: 02/08/2012 Document Reviewed: 12/01/2007 [facility] Patient Information 2014 [place], [place].

## 2012-09-17 NOTE — Progress Notes (Signed)
  Subjective:    Patient ID: Shari Branch, female    DOB: 1946/02/20, 67 y.o.   MRN: [id number]  HPI  Patient here with left eye redness and drainage for the past week and a half. She denies any pain in her eye or change in her vision. 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 Her vision is unchanged from her baseline. She denies any sick contacts or fever. States her eye feels irritated She's also had some fatigue and will like to have her thyroid rechecked  Review of Systems - per above  GEN- + fatigue, fever, weight loss,weakness, recent illness HEENT- +eye drainage, denies change in vision, nasal discharge, Neuro- denies headache, dizziness, syncope, seizure activity       Objective:   Physical Exam GEN- NAD, alert and oriented x3 HEENT- PERRL, EOMI,+ injected left conjunctiva/sclera, yellow discharge left eye, matting on lashes, no swelling of eyelids, vision grossly in tact, MMM, oropharynx clear Neck- Supple, no LAD Neuro- CNII-XII grossly in tact         Assessment & Plan:

## 2012-09-17 NOTE — Assessment & Plan Note (Signed)
Will treat with topical antibiotics- polytrim See handout Vision at baseline

## 2012-09-17 NOTE — Assessment & Plan Note (Signed)
Recheck TSH at pt request, last change to synthroid about 8 months ago

## 2012-09-18 ENCOUNTER — Other Ambulatory Visit: Payer: Self-pay | Admitting: Family Medicine

## 2012-09-19 NOTE — Telephone Encounter (Signed)
Ok to refill 

## 2012-09-20 NOTE — Telephone Encounter (Signed)
Med refilled.

## 2012-09-20 NOTE — Telephone Encounter (Signed)
Ok to refill 

## 2012-09-28 ENCOUNTER — Ambulatory Visit: Payer: Federal, State, Local not specified - PPO | Admitting: Cardiovascular Disease

## 2012-10-03 ENCOUNTER — Telehealth: Payer: Self-pay | Admitting: Family Medicine

## 2012-10-03 MED ORDER — ERYTHROMYCIN 5 MG/GM OP OINT: TOPICAL_OINTMENT | Freq: Two times a day (BID) | OPHTHALMIC | Status: DC

## 2012-10-03 NOTE — Telephone Encounter (Addendum)
I will switch her to a different eye medication, start today, if not improved by Monday come back for a recheck

## 2012-10-03 NOTE — Telephone Encounter (Signed)
Patient aware.

## 2012-10-09 ENCOUNTER — Encounter: Payer: Self-pay | Admitting: Family Medicine

## 2012-10-09 ENCOUNTER — Ambulatory Visit (INDEPENDENT_AMBULATORY_CARE_PROVIDER_SITE_OTHER): Payer: Medicare Other | Admitting: Family Medicine

## 2012-10-09 VITALS — BP 118/80 | HR 84 | Temp 97.8°F | Resp 16 | Wt 169.0 lb

## 2012-10-09 DIAGNOSIS — IMO0002 Reserved for concepts with insufficient information to code with codable children: Secondary | ICD-10-CM

## 2012-10-09 DIAGNOSIS — S0502XS Injury of conjunctiva and corneal abrasion without foreign body, left eye, sequela: Secondary | ICD-10-CM

## 2012-10-09 NOTE — Progress Notes (Signed)
Subjective:    Patient ID: Shari Branch, female    DOB: 1945-09-17, 67 y.o.   MRN: [id number]  HPI Patient has had a two-week history of "infection" in her left eye.  She was initially tried on Polytrim for one week. When this did not improve her symptoms, she was switched to erythromycin ophthalmic ointment.  She has been on this for one week.  She continues to have redness pain and irritation in her left eye.  She denies photosensitivity.  She states her eye feels dry like there something in it.  She denies blurry vision. She denies any visual changes. Past Medical History  Diagnosis Date  . Thyroid disease   . Hyperlipidemia   . Trigeminal neuralgia     Trigemninal Tumor- Left, resected [employer]    Current Outpatient Prescriptions on File Prior to Visit  Medication Sig Dispense Refill  . amitriptyline (ELAVIL) 50 MG tablet TAKE FIVE TABLETS BY MOUTH ONCE DAILY AT BEDTIME  150 tablet  3  . butalbital-aspirin-caffeine (FIORINAL) 50-325-40 MG per capsule TAKE ONE CAPSULE BY MOUTH AS NEEDED FOR  MIGRAINES  30 capsule  0  . Cholecalciferol (VITAMIN D-3) 5000 UNITS TABS Take 5,000 Units by mouth.      . clobetasol cream (TEMOVATE) 0.05 % Apply topically 2 (two) times daily.      . Cyanocobalamin (B-12) 1000 MCG CAPS Take 1,000 mg by mouth daily.      Marland Kitchen erythromycin ophthalmic ointment Place into the left eye 2 (two) times daily. FOR 1 WEEK  3.5 g  0  . escitalopram (LEXAPRO) 10 MG tablet Take 1 tablet (10 mg total) by mouth daily.  30 tablet  5  . ibuprofen (ADVIL,MOTRIN) 800 MG tablet Take 1 tablet (800 mg total) by mouth every 8 (eight) hours as needed for pain.  60 tablet  0  . levothyroxine (SYNTHROID, LEVOTHROID) 100 MCG tablet TAKE ONE TABLET BY MOUTH EVERY DAY  30 tablet  5  . Magnesium 500 MG TABS Take 500 mg by mouth daily.      . Naftifine HCl (NAFTIN) 2 % CREA Apply topically.      . naproxen (NAPROSYN) 500 MG tablet Take 1 tablet (500 mg total) by mouth 2 (two) times  daily with a meal.  60 tablet  0  . pantoprazole (PROTONIX) 40 MG tablet TAKE ONE TABLET BY MOUTH TWICE DAILY  180 tablet  4   No current facility-administered medications on file prior to visit.   Allergies  Allergen Reactions  . Sulfonamide Derivatives     REACTION: Joint Pain and locking - walks like a 67 year old   History   Social History  . Marital Status: Married    Spouse Name: N/A    Number of Children: N/A  . Years of Education: N/A   Occupational History  . Not on file.   Social History Main Topics  . Smoking status: Current Every Day Smoker -- 1.50 packs/day  . Smokeless tobacco: Not on file  . Alcohol Use: No  . Drug Use: No  . Sexually Active:    Other Topics Concern  . Not on file   Social History Narrative  . No narrative on file      Review of Systems  All other systems reviewed and are negative.       Objective:   Physical Exam  Eyes: EOM are normal. Pupils are equal, round, and reactive to light.   left eye conjunctiva is injected and  red. There is no erythema or edema in the lids.  On fluorescein exam there are3 corneal abrasions around and over the pupil.  I do not observe any dendrites.        Assessment & Plan:  1. Corneal abrasion, left, sequela Continue erythromycin ointment. Given the location of the abrasions, I recommended ophthalmology consultation. I do not see any evidence of herpetiform dendrites.  I am concerned about possible scleritis as well. - Ambulatory referral to Ophthalmology

## 2012-10-11 DIAGNOSIS — H43399 Other vitreous opacities, unspecified eye: Secondary | ICD-10-CM | POA: Diagnosis not present

## 2012-10-11 DIAGNOSIS — H26499 Other secondary cataract, unspecified eye: Secondary | ICD-10-CM | POA: Diagnosis not present

## 2012-10-11 DIAGNOSIS — H40019 Open angle with borderline findings, low risk, unspecified eye: Secondary | ICD-10-CM | POA: Diagnosis not present

## 2012-10-11 DIAGNOSIS — H35379 Puckering of macula, unspecified eye: Secondary | ICD-10-CM | POA: Diagnosis not present

## 2012-10-11 DIAGNOSIS — H02209 Unspecified lagophthalmos unspecified eye, unspecified eyelid: Secondary | ICD-10-CM | POA: Diagnosis not present

## 2012-10-11 DIAGNOSIS — H04129 Dry eye syndrome of unspecified lacrimal gland: Secondary | ICD-10-CM | POA: Diagnosis not present

## 2012-10-11 DIAGNOSIS — H35039 Hypertensive retinopathy, unspecified eye: Secondary | ICD-10-CM | POA: Diagnosis not present

## 2012-10-11 DIAGNOSIS — H31019 Macula scars of posterior pole (postinflammatory) (post-traumatic), unspecified eye: Secondary | ICD-10-CM | POA: Diagnosis not present

## 2012-10-16 DIAGNOSIS — M6740 Ganglion, unspecified site: Secondary | ICD-10-CM | POA: Diagnosis not present

## 2012-10-16 DIAGNOSIS — M79609 Pain in unspecified limb: Secondary | ICD-10-CM | POA: Diagnosis not present

## 2012-10-17 ENCOUNTER — Encounter (HOSPITAL_COMMUNITY): Payer: Self-pay

## 2012-10-29 ENCOUNTER — Encounter: Payer: Self-pay | Admitting: *Deleted

## 2012-10-29 ENCOUNTER — Encounter: Payer: Self-pay | Admitting: Cardiovascular Disease

## 2012-10-29 ENCOUNTER — Ambulatory Visit (INDEPENDENT_AMBULATORY_CARE_PROVIDER_SITE_OTHER): Payer: Medicare Other | Admitting: Cardiovascular Disease

## 2012-10-29 VITALS — BP 100/80 | HR 93 | Ht 68.0 in | Wt 168.0 lb

## 2012-10-29 DIAGNOSIS — R079 Chest pain, unspecified: Secondary | ICD-10-CM | POA: Diagnosis not present

## 2012-10-29 DIAGNOSIS — R5381 Other malaise: Secondary | ICD-10-CM

## 2012-10-29 DIAGNOSIS — G500 Trigeminal neuralgia: Secondary | ICD-10-CM | POA: Diagnosis not present

## 2012-10-29 NOTE — Patient Instructions (Addendum)
Your physician recommends that you schedule a follow-up appointment in: 6 WEEKS   Your physician discussed the importance of regular exercise and recommended that you start or continue a regular exercise program for good health.WE WILL CALL YOU WITH THE RESULTS/INSTURCTIONS ONCE COMPLETED

## 2012-10-29 NOTE — Progress Notes (Signed)
Patient ID: LAMISHA ROUSSELL, female   DOB: June 19, 1945, 67 y.o.   MRN: [id number]    CARDIOLOGY CONSULT NOTE  Patient ID: DARRIEN LAAKSO MRN: [id number] DOB/AGE: 26-May-1945 67 y.o.    HPI:  Mrs. Tanya Nones has a h/o hypothyroidism, GERD, and tobacco abuse. She had been experiencing both right and left-sided chest pain, and was thus referred today by Dr. Tanya Nones. It was initially felt to be most likely due to anxiety, and she was started on Lexapro. The pain radiates to her jaw and down her left arm. An ECG was performed which revealed sinus rhythm with a 1st degree AV block, and no diagnostic ST-T abnormalities.  She used to live in [place] and worked on a Engineer, mining, and that's when her anxiety began (approximately 7 years ago, when she had a disagreement at work). She developed trigeminal neuralgia shortly thereafter, and was evaluated at [facility], where she received radiation (for a tumor) and therapy to sever the facial nerve. The treatment was reportedly successful.  She was receiving biofeedback therapy for anxiety and chest pain related to this, starting 7 years ago in [place]. Her pain always starts in the right side of her jaw and then radiates across her chest from right to left.  Now her pain starts in her left jaw and radiates across her chest from left to right. She denies associated shortness of breath and palpitations. She has a chronic h/o dizziness, which has gotten worse. 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  She gets "charlie horses" in her left calf.    Review of systems complete and found to be negative unless listed above in HPI  Past Medical History: See HPI  SocHx: She is originally from [facility]. She moved to [place] for her grandson. Smokes 1 ppd x 30 years.  FamHx: her father and most of his brothers and sisters had CAD.   No family history on file.  History   Social History  . Marital Status: Married    Spouse Name: N/A   Number of Children: N/A  . Years of Education: N/A   Occupational History  . Not on file.   Social History Main Topics  . Smoking status: Current Every Day Smoker -- 1.50 packs/day  . Smokeless tobacco: Not on file  . Alcohol Use: No  . Drug Use: No  . Sexual Activity:    Other Topics Concern  . Not on file   Social History Narrative  . No narrative on file        BP: 100/80 Pulse: 93      Physical exam  General: NAD Neck: No JVD, no thyromegaly or thyroid nodule.  Lungs: Clear to auscultation bilaterally with normal respiratory effort. CV: Nondisplaced PMI.  Heart regular S1/S2, no S3/S4, no murmur.  No peripheral edema.  No carotid bruit.  Normal dorsalis pedis pulses, with diminished posterior tibial pulses.  Abdomen: Soft, nontender, no hepatosplenomegaly, no distention.  Skin: Intact without lesions or rashes.  Neurologic: Alert and oriented x 3.  Psych: Normal affect. Extremities: No clubbing or cyanosis.  HEENT: Normal.   Labs:   Lab Results  Component Value Date   WBC 6.0 08/27/2012   HGB 13.6 08/27/2012   HCT 39.8 08/27/2012   MCV 89.4 08/27/2012   PLT 223 08/27/2012   No results found for this basename: NA, K, CL, CO2, BUN, CREATININE, CALCIUM, LABALBU, PROT, BILITOT, ALKPHOS, ALT, AST, GLUCOSE,  in the last  168 hours No results found for this basename: CKTOTAL, CKMB, CKMBINDEX, TROPONINI    Lab Results  Component Value Date   CHOL 205* 08/07/2008   CHOL 227* 01/28/2008   CHOL 245* 01/29/2007   Lab Results  Component Value Date   HDL 43 08/07/2008   HDL 45 01/28/2008   HDL 49 01/29/2007   Lab Results  Component Value Date   LDLCALC 119* 08/07/2008   LDLCALC 156* 01/28/2008   LDLCALC 172* 01/29/2007   Lab Results  Component Value Date   TRIG 215* 08/07/2008   TRIG 131 01/28/2008   TRIG 120 01/29/2007   Lab Results  Component Value Date   CHOLHDL 4.8 Ratio 08/07/2008   CHOLHDL 5.0 Ratio 01/28/2008   CHOLHDL 5.0 Ratio 01/29/2007   No results  found for this basename: LDLDIRECT       EKG: Sinus rhythm, rate 93 bpm, axis within normal limits, intervals within normal limits, no acute ST-T wave changes.  Radiology: Chest xray in June 2014 Findings: Cardiac silhouette is normal size and shape. Mediastinal and hilar contours appear normal and stable. There is a low-lying diaphragm with slight flattening on lateral view consistent with mild hyperinflation configuration. Basilar linear opacities may reflect subsegmental atelectasis or fibrosis. No consolidation or pleural effusion is evident. There is an osteopenic appearance of the bones with minimal degenerative spondylosis.  IMPRESSION: Low-lying diaphragm with slight flattening on lateral view consistent with mild hyperinflation configuration. Basilar linear opacities may reflect subsegmental atelectasis or fibrosis. No consolidation or pleural effusion is evident.     ASSESSMENT AND PLAN:  1. Chest pain: while this may be related to anxiety, she has a strong family h/o CAD and a personal h/o tobacco abuse. Will obtain an exercise Myoview stress test.  Signed: Prentice Docker, M.D., F.A.C.C. 10/29/2012, 8:54 AM

## 2012-11-07 ENCOUNTER — Encounter (HOSPITAL_COMMUNITY)
Admission: RE | Admit: 2012-11-07 | Discharge: 2012-11-07 | Disposition: A | Discharge status: Home / Self Care | Payer: Medicare Other | Source: Ambulatory Visit | Attending: Cardiovascular Disease | Admitting: Cardiovascular Disease

## 2012-11-07 ENCOUNTER — Encounter (HOSPITAL_COMMUNITY): Payer: Self-pay

## 2012-11-07 DIAGNOSIS — R079 Chest pain, unspecified: Secondary | ICD-10-CM

## 2012-11-07 MED ORDER — SODIUM CHLORIDE 0.9 % IJ SOLN
INTRAMUSCULAR | Status: AC
  Administered 2012-11-07: 10 mL via INTRAVENOUS
  Filled 2012-11-07: qty 10

## 2012-11-07 MED ORDER — TECHNETIUM TC 99M SESTAMIBI - CARDIOLITE
30.0000 | Freq: Once | INTRAVENOUS | Status: AC | PRN
  Administered 2012-11-07: 30 via INTRAVENOUS

## 2012-11-07 MED ORDER — REGADENOSON 0.4 MG/5ML IV SOLN
INTRAVENOUS | Status: AC
  Filled 2012-11-07: qty 5

## 2012-11-07 MED ORDER — TECHNETIUM TC 99M SESTAMIBI - CARDIOLITE
10.0000 | Freq: Once | INTRAVENOUS | Status: AC | PRN
  Administered 2012-11-07: 09:00:00 10 via INTRAVENOUS

## 2012-11-07 NOTE — Progress Notes (Signed)
Stress Lab Nurses Notes - Shari Branch  Shari Branch 11/07/2012 Reason for doing test: Chest Pain Type of test: Stress Cardiolite Nurse performing test: Parke Poisson, RN Nuclear Medicine Tech: Lyndel Pleasure Echo Tech: Not Applicable MD performing test: Dr. Purvis Sheffield / Joni Reining NP Family MD: Dr. Tanya Nones Test explained and consent signed: yes IV started: 22g jelco, Saline lock flushed, No redness or edema and Saline lock started in radiology Symptoms: none Treatment/Intervention: None Reason test stopped: fatigue After recovery IV was: Discontinued via X-ray tech and No redness or edema Patient to return to Nuc. Med at : 12:30 Patient discharged: Home Patient's Condition upon discharge was: stable Comments: During test peak BP 140/82 & HR 142.  Recovery BP 136/84 & HR 105.  Symptoms  resolved in recovery. [name] T

## 2012-11-19 ENCOUNTER — Other Ambulatory Visit: Payer: Self-pay | Admitting: Family Medicine

## 2012-11-20 ENCOUNTER — Ambulatory Visit: Payer: Federal, State, Local not specified - PPO | Admitting: Cardiovascular Disease

## 2012-11-27 ENCOUNTER — Telehealth: Payer: Self-pay | Admitting: Family Medicine

## 2012-11-27 ENCOUNTER — Encounter: Payer: Self-pay | Admitting: *Deleted

## 2012-11-27 NOTE — Telephone Encounter (Signed)
Pt called stating that Lexapro 10 mg is not strong enough. She wants to know if you can up the dose or does she need to come back in for another appt.

## 2012-11-27 NOTE — Telephone Encounter (Signed)
We can increase it to 20 mg a day.

## 2012-11-28 NOTE — Telephone Encounter (Signed)
LMTRC

## 2012-11-30 ENCOUNTER — Ambulatory Visit: Payer: Federal, State, Local not specified - PPO | Admitting: Cardiovascular Disease

## 2012-11-30 MED ORDER — ESCITALOPRAM OXALATE 20 MG PO TABS: 20.0000 mg | ORAL_TABLET | Freq: Every day | ORAL | Status: DC

## 2012-11-30 NOTE — Addendum Note (Signed)
Addended by: Legrand Rams B on: 11/30/2012 05:02 PM   Modules accepted: Orders

## 2012-11-30 NOTE — Telephone Encounter (Signed)
Sent new rx to pharm for increased dose

## 2012-12-18 DIAGNOSIS — H04129 Dry eye syndrome of unspecified lacrimal gland: Secondary | ICD-10-CM | POA: Diagnosis not present

## 2012-12-18 DIAGNOSIS — H18429 Band keratopathy, unspecified eye: Secondary | ICD-10-CM | POA: Diagnosis not present

## 2012-12-18 DIAGNOSIS — H18519 Endothelial corneal dystrophy, unspecified eye: Secondary | ICD-10-CM | POA: Diagnosis not present

## 2012-12-18 DIAGNOSIS — H40019 Open angle with borderline findings, low risk, unspecified eye: Secondary | ICD-10-CM | POA: Diagnosis not present

## 2013-02-05 ENCOUNTER — Ambulatory Visit (INDEPENDENT_AMBULATORY_CARE_PROVIDER_SITE_OTHER): Payer: Medicare Other | Admitting: Family Medicine

## 2013-02-05 ENCOUNTER — Encounter: Payer: Self-pay | Admitting: Family Medicine

## 2013-02-05 ENCOUNTER — Other Ambulatory Visit: Payer: Self-pay | Admitting: Family Medicine

## 2013-02-05 VITALS — BP 110/74 | HR 86 | Temp 97.8°F | Resp 18 | Ht 68.0 in | Wt 183.0 lb

## 2013-02-05 DIAGNOSIS — J209 Acute bronchitis, unspecified: Secondary | ICD-10-CM

## 2013-02-05 MED ORDER — AZITHROMYCIN 250 MG PO TABS: ORAL_TABLET | ORAL | Status: DC

## 2013-02-05 NOTE — Progress Notes (Signed)
Subjective:    Patient ID: Shari Branch, female    DOB: 03-18-1945, 67 y.o.   MRN: [id number]  HPI  Patient has had a cough productive of yellow and green sputum for approximately one week. She is now getting worsening shortness of breath and subjective fevers. She is also having bilateral frontal sinus pain and postnasal drip. She feels like she is getting worse not better after one week. She denies any antalgia or sore throat. Past Medical History  Diagnosis Date  . Thyroid disease   . Hyperlipidemia   . Trigeminal neuralgia     Trigemninal Tumor- Left, resected [employer]    Current Outpatient Prescriptions on File Prior to Visit  Medication Sig Dispense Refill  . amitriptyline (ELAVIL) 50 MG tablet TAKE FIVE TABLETS BY MOUTH ONCE DAILY AT BEDTIME  150 tablet  3  . butalbital-aspirin-caffeine (FIORINAL) 50-325-40 MG per capsule TAKE ONE CAPSULE BY MOUTH AS NEEDED FOR  MIGRAINES  30 capsule  0  . Cholecalciferol (VITAMIN D-3) 5000 UNITS TABS Take 5,000 Units by mouth.      . clobetasol cream (TEMOVATE) 0.05 % Apply topically 2 (two) times daily.      . Cyanocobalamin (B-12) 1000 MCG CAPS Take 1,000 mg by mouth daily.      Marland Kitchen escitalopram (LEXAPRO) 20 MG tablet Take 1 tablet (20 mg total) by mouth daily.  30 tablet  3  . Ginkgo Biloba 200 MG CAPS Take by mouth daily.      Marland Kitchen ibuprofen (ADVIL,MOTRIN) 800 MG tablet Take 1 tablet (800 mg total) by mouth every 8 (eight) hours as needed for pain.  60 tablet  0  . Magnesium 500 MG TABS Take 500 mg by mouth daily.      . naproxen (NAPROSYN) 500 MG tablet Take 1 tablet (500 mg total) by mouth 2 (two) times daily with a meal.  60 tablet  0  . pantoprazole (PROTONIX) 40 MG tablet TAKE ONE TABLET BY MOUTH TWICE DAILY  180 tablet  4   No current facility-administered medications on file prior to visit.   Allergies  Allergen Reactions  . Sulfonamide Derivatives     REACTION: Joint Pain and locking - walks like a 67 year old    History   Social History  . Marital Status: Married    Spouse Name: N/A    Number of Children: N/A  . Years of Education: N/A   Occupational History  . Not on file.   Social History Main Topics  . Smoking status: Current Every Day Smoker -- 1.50 packs/day  . Smokeless tobacco: Not on file  . Alcohol Use: No  . Drug Use: No  . Sexual Activity:    Other Topics Concern  . Not on file   Social History Narrative  . No narrative on file     Review of Systems  All other systems reviewed and are negative.       Objective:   Physical Exam  Vitals reviewed. HENT:  Nose: Nose normal.  Mouth/Throat: Oropharynx is clear and moist. No oropharyngeal exudate.  Eyes: No scleral icterus.  Neck: Neck supple. No thyromegaly present.  Cardiovascular: Normal rate, regular rhythm and normal heart sounds.   Pulmonary/Chest: Effort normal. She has no wheezes. She has rales.  Lymphadenopathy:    She has no cervical adenopathy.   patient has faint bibasilar crackles as well as bilateral conjunctivitis.        Assessment & Plan:  1. Acute bronchitis Patient  suffering from bronchitis as well as sinusitis. Begin Zithromax. If symptoms do not improve I would have a low threshold for starting the patient on a prednisone Dosepak. Given her history of smoking I believe there may also be an element of mild emphysema/chronic bronchitis. Possibly there may be some allergies as well causing the conjunctivitis and postnasal drip. - azithromycin (ZITHROMAX) 250 MG tablet; 2 tabs poqday 1, 1 tab poqday 2-5  Dispense: 6 tablet; Refill: 0

## 2013-02-05 NOTE — Telephone Encounter (Signed)
Medication refilled per protocol. 

## 2013-03-18 ENCOUNTER — Other Ambulatory Visit: Payer: Self-pay | Admitting: Family Medicine

## 2013-04-01 ENCOUNTER — Other Ambulatory Visit: Payer: Self-pay | Admitting: Family Medicine

## 2013-04-15 ENCOUNTER — Other Ambulatory Visit: Payer: Self-pay | Admitting: Family Medicine

## 2013-04-15 ENCOUNTER — Ambulatory Visit: Payer: Medicare Other | Admitting: Family Medicine

## 2013-04-15 MED ORDER — LEVOTHYROXINE SODIUM 100 MCG PO TABS: ORAL_TABLET | ORAL | Status: DC

## 2013-04-15 NOTE — Telephone Encounter (Signed)
Rx Refilled  

## 2013-04-17 ENCOUNTER — Ambulatory Visit (INDEPENDENT_AMBULATORY_CARE_PROVIDER_SITE_OTHER): Payer: Medicare Other | Admitting: Family Medicine

## 2013-04-17 ENCOUNTER — Encounter: Payer: Self-pay | Admitting: Family Medicine

## 2013-04-17 VITALS — BP 108/78 | HR 64 | Temp 97.6°F | Resp 18 | Ht 68.0 in | Wt 183.0 lb

## 2013-04-17 DIAGNOSIS — R5381 Other malaise: Secondary | ICD-10-CM

## 2013-04-17 DIAGNOSIS — Z79899 Other long term (current) drug therapy: Secondary | ICD-10-CM | POA: Diagnosis not present

## 2013-04-17 DIAGNOSIS — E039 Hypothyroidism, unspecified: Secondary | ICD-10-CM | POA: Diagnosis not present

## 2013-04-17 DIAGNOSIS — Z23 Encounter for immunization: Secondary | ICD-10-CM

## 2013-04-17 DIAGNOSIS — R5383 Other fatigue: Secondary | ICD-10-CM | POA: Diagnosis not present

## 2013-04-17 LAB — CBC WITH DIFFERENTIAL/PLATELET
Basophils Absolute: 0 10*3/uL (ref 0.0–0.1)
Basophils Relative: 0 % (ref 0–1)
EOS ABS: 0.1 10*3/uL (ref 0.0–0.7)
Eosinophils Relative: 2 % (ref 0–5)
HCT: 40.4 % (ref 36.0–46.0)
Hemoglobin: 13.8 g/dL (ref 12.0–15.0)
LYMPHS ABS: 1.5 10*3/uL (ref 0.7–4.0)
LYMPHS PCT: 26 % (ref 12–46)
MCH: 30.5 pg (ref 26.0–34.0)
MCHC: 34.2 g/dL (ref 30.0–36.0)
MCV: 89.4 fL (ref 78.0–100.0)
Monocytes Absolute: 0.4 10*3/uL (ref 0.1–1.0)
Monocytes Relative: 6 % (ref 3–12)
NEUTROS ABS: 4 10*3/uL (ref 1.7–7.7)
NEUTROS PCT: 66 % (ref 43–77)
PLATELETS: 186 10*3/uL (ref 150–400)
RBC: 4.52 MIL/uL (ref 3.87–5.11)
RDW: 14.2 % (ref 11.5–15.5)
WBC: 6 10*3/uL (ref 4.0–10.5)

## 2013-04-17 LAB — COMPLETE METABOLIC PANEL WITH GFR
ALT: 29 U/L (ref 0–35)
AST: 20 U/L (ref 0–37)
Albumin: 3.9 g/dL (ref 3.5–5.2)
Alkaline Phosphatase: 83 U/L (ref 39–117)
BUN: 8 mg/dL (ref 6–23)
CALCIUM: 9.1 mg/dL (ref 8.4–10.5)
CHLORIDE: 104 meq/L (ref 96–112)
CO2: 27 meq/L (ref 19–32)
CREATININE: 0.86 mg/dL (ref 0.50–1.10)
GFR, EST AFRICAN AMERICAN: 80 mL/min
GFR, EST NON AFRICAN AMERICAN: 70 mL/min
Glucose, Bld: 93 mg/dL (ref 70–99)
Potassium: 4.3 mEq/L (ref 3.5–5.3)
Sodium: 137 mEq/L (ref 135–145)
Total Bilirubin: 0.3 mg/dL (ref 0.2–1.2)
Total Protein: 6.6 g/dL (ref 6.0–8.3)

## 2013-04-17 LAB — TSH: TSH: 9.123 u[IU]/mL — ABNORMAL HIGH (ref 0.350–4.500)

## 2013-04-17 LAB — VITAMIN B12: Vitamin B-12: 756 pg/mL (ref 211–911)

## 2013-04-17 MED ORDER — LEVOTHYROXINE SODIUM 112 MCG PO TABS: 112.0000 ug | ORAL_TABLET | Freq: Every day | ORAL | Status: DC

## 2013-04-17 NOTE — Addendum Note (Signed)
Addended by: Shary Decamp B on: 04/17/2013 09:23 AM   Modules accepted: Orders

## 2013-04-17 NOTE — Progress Notes (Signed)
Subjective:    Patient ID: Shari Branch, female    DOB: Aug 04, 1945, 68 y.o.   MRN: [id number]  HPI Patient has a history of hypothyroidism. Her last TSH was checked in July 2014 and was found be in therapeutic range at approximately 1.5.  However over the last few months the patient has become increasingly fatigued. She reports memory problems. She reports poor concentration. She independently increased her levothyroxine to 112 mcg by mouth daily. She's been taking this for last 3 weeks. She is starting to feel much better at higher dose. She would like to recheck her TSH.  She is asking if it would be okay to take a slightly higher dose of levothyroxine. She now does increase risk including heart problems as well as osteoporosis and she is willing to accept this given how better she feels at the higher dose.  She is also due for Prevnar 13. Past Medical History  Diagnosis Date  . Thyroid disease   . Hyperlipidemia   . Trigeminal neuralgia     Trigemninal Tumor- Left, resected [employer]    Current Outpatient Prescriptions on File Prior to Visit  Medication Sig Dispense Refill  . amitriptyline (ELAVIL) 50 MG tablet TAKE FIVE TABLETS BY MOUTH ONCE DAILY AT BEDTIME  150 tablet  5  . butalbital-aspirin-caffeine (FIORINAL) 50-325-40 MG per capsule TAKE ONE CAPSULE BY MOUTH AS NEEDED FOR  MIGRAINES  30 capsule  0  . Cholecalciferol (VITAMIN D-3) 5000 UNITS TABS Take 5,000 Units by mouth.      . clobetasol cream (TEMOVATE) 0.05 % Apply topically 2 (two) times daily.      . Cyanocobalamin (B-12) 1000 MCG CAPS Take 1,000 mg by mouth daily.      Marland Kitchen escitalopram (LEXAPRO) 20 MG tablet TAKE ONE TABLET BY MOUTH ONCE DAILY  30 tablet  5  . Ginkgo Biloba 200 MG CAPS Take by mouth daily.      Marland Kitchen ibuprofen (ADVIL,MOTRIN) 800 MG tablet Take 1 tablet (800 mg total) by mouth every 8 (eight) hours as needed for pain.  60 tablet  0  . levothyroxine (SYNTHROID, LEVOTHROID) 100 MCG tablet TAKE ONE  TABLET BY MOUTH ONCE DAILY  30 tablet  5  . Magnesium 500 MG TABS Take 500 mg by mouth daily.      . naproxen (NAPROSYN) 500 MG tablet Take 1 tablet (500 mg total) by mouth 2 (two) times daily with a meal.  60 tablet  0  . pantoprazole (PROTONIX) 40 MG tablet TAKE ONE TABLET BY MOUTH TWICE DAILY  180 tablet  4   No current facility-administered medications on file prior to visit.   Allergies  Allergen Reactions  . Sulfonamide Derivatives     REACTION: Joint Pain and locking - walks like a 68 year old   History   Social History  . Marital Status: Married    Spouse Name: N/A    Number of Children: N/A  . Years of Education: N/A   Occupational History  . Not on file.   Social History Main Topics  . Smoking status: Current Every Day Smoker -- 1.50 packs/day  . Smokeless tobacco: Not on file  . Alcohol Use: No  . Drug Use: No  . Sexual Activity:    Other Topics Concern  . Not on file   Social History Narrative  . No narrative on file      Review of Systems  All other systems reviewed and are negative.  Objective:   Physical Exam  Vitals reviewed. Constitutional: She is oriented to person, place, and time. She appears well-developed and well-nourished.  Neck: Neck supple. No JVD present. No thyromegaly present.  Cardiovascular: Normal rate, regular rhythm and normal heart sounds.   Pulmonary/Chest: Effort normal and breath sounds normal.  Lymphadenopathy:    She has no cervical adenopathy.  Neurological: She is alert and oriented to person, place, and time. No cranial nerve deficit. She exhibits normal muscle tone. Coordination normal.  Psychiatric: She has a normal mood and affect. Her behavior is normal. Judgment and thought content normal.          Assessment & Plan:  1. Unspecified hypothyroidism  - levothyroxine (SYNTHROID, LEVOTHROID) 112 MCG tablet; Take 1 tablet (112 mcg total) by mouth daily.  Dispense: 30 tablet; Refill: 1  2. Other malaise  and fatigue - levothyroxine (SYNTHROID, LEVOTHROID) 112 MCG tablet; Take 1 tablet (112 mcg total) by mouth daily.  Dispense: 30 tablet; Refill: 1 - COMPLETE METABOLIC PANEL WITH GFR - CBC with Differential - TSH - Vitamin B12  I will recheck a TSH today. If her TSH is suppressed I would recommend trying to alternate 112 mcg 100 mcg to see if we can compromise and find an acceptable dose. If TSH is within normal limits I recommend that she continue to 112 mcg every day for an additional 3-4 weeks and then recheck a TSH after she has been on 112 mcg 2 months. Again the TSH is suppressed, I would recommend trying to alternate 112 and 100 mcg to try to find an acceptable alternative/compromise. I again discussed the risk of taking supratherapeutic doses of levothyroxine including atrial fibrillation, cardiac arrhythmias, weight loss, alopecia, eyelashes, and osteoporosis.  Also check other causes of fatigue including a CBC CMP and vitamin B12 level.

## 2013-05-06 ENCOUNTER — Encounter: Payer: Self-pay | Admitting: Family Medicine

## 2013-05-06 ENCOUNTER — Ambulatory Visit (INDEPENDENT_AMBULATORY_CARE_PROVIDER_SITE_OTHER): Payer: Medicare Other | Admitting: Family Medicine

## 2013-05-06 VITALS — BP 108/70 | HR 90 | Temp 97.1°F | Resp 18 | Ht 67.0 in | Wt 184.0 lb

## 2013-05-06 DIAGNOSIS — R413 Other amnesia: Secondary | ICD-10-CM

## 2013-05-06 DIAGNOSIS — R279 Unspecified lack of coordination: Secondary | ICD-10-CM | POA: Diagnosis not present

## 2013-05-06 DIAGNOSIS — R270 Ataxia, unspecified: Secondary | ICD-10-CM

## 2013-05-06 NOTE — Progress Notes (Signed)
Subjective:    Patient ID: Shari Branch, female    DOB: [birth year], 68 y.o.   MRN: [id number]  HPI I recently saw this patient on February 11. She is complaining of the same problems.  He does not remember rechecking her thyroid level and asking her to return after in 6 weeks. She is also complaining of ataxia. This is been going on for the last 3 months. She is staggering to the left and right occasionally when she walks. It is occurring on a daily basis. On examination today she is unable to perform heel-to-toe rapid heart rate without falling to the side and bracing herself against the wall. She is able to she has a difficult time turning. She also has an abnormal Romberg exam in that had to brace the patient when she closed her eyes. She is able to perform finger to nose testing without difficulty. The remainder of her neurologic exam is normal with no evidence of any cranial nerve deficiency. She has chronic hearing loss. She denies any new hearing loss, tinnitus, or vertigo. The dizziness and ataxia is not related to position changes. Denies any orthostatic dizziness. Past Medical History  Diagnosis Date  . Thyroid disease   . Hyperlipidemia   . Trigeminal neuralgia     Trigemninal Tumor- Left, resected [employer]    Current Outpatient Prescriptions on File Prior to Visit  Medication Sig Dispense Refill  . amitriptyline (ELAVIL) 50 MG tablet TAKE FIVE TABLETS BY MOUTH ONCE DAILY AT BEDTIME  150 tablet  5  . butalbital-aspirin-caffeine (FIORINAL) 50-325-40 MG per capsule TAKE ONE CAPSULE BY MOUTH AS NEEDED FOR  MIGRAINES  30 capsule  0  . Cholecalciferol (VITAMIN D-3) 5000 UNITS TABS Take 5,000 Units by mouth.      . clobetasol cream (TEMOVATE) 0.05 % Apply topically 2 (two) times daily.      . Cyanocobalamin (B-12) 1000 MCG CAPS Take 1,000 mg by mouth daily.      Marland Kitchen escitalopram (LEXAPRO) 20 MG tablet TAKE ONE TABLET BY MOUTH ONCE DAILY  30 tablet  5  . Ginkgo Biloba 200 MG  CAPS Take by mouth daily.      Marland Kitchen ibuprofen (ADVIL,MOTRIN) 800 MG tablet Take 1 tablet (800 mg total) by mouth every 8 (eight) hours as needed for pain.  60 tablet  0  . levothyroxine (SYNTHROID, LEVOTHROID) 112 MCG tablet Take 1 tablet (112 mcg total) by mouth daily.  30 tablet  1  . Magnesium 500 MG TABS Take 500 mg by mouth daily.      . naproxen (NAPROSYN) 500 MG tablet Take 1 tablet (500 mg total) by mouth 2 (two) times daily with a meal.  60 tablet  0  . pantoprazole (PROTONIX) 40 MG tablet TAKE ONE TABLET BY MOUTH TWICE DAILY  180 tablet  4   No current facility-administered medications on file prior to visit.   Allergies  Allergen Reactions  . Sulfonamide Derivatives     REACTION: Joint Pain and locking - walks like a 68 year old   History   Social History  . Marital Status: Married    Spouse Name: N/A    Number of Children: N/A  . Years of Education: N/A   Occupational History  . Not on file.   Social History Main Topics  . Smoking status: Current Every Day Smoker -- 1.50 packs/day  . Smokeless tobacco: Not on file  . Alcohol Use: No  . Drug Use: No  . Sexual  Activity:    Other Topics Concern  . Not on file   Social History Narrative  . No narrative on file   Past Medical History  Diagnosis Date  . Thyroid disease   . Hyperlipidemia   . Trigeminal neuralgia     Trigemninal Tumor- Left, resected [employer]       Review of Systems  All other systems reviewed and are negative.       Objective:   Physical Exam  Vitals reviewed. Constitutional: She is oriented to person, place, and time.  Cardiovascular: Normal rate, regular rhythm and normal heart sounds.   Pulmonary/Chest: Effort normal and breath sounds normal. No respiratory distress. She has no wheezes. She has no rales.  Abdominal: Soft. Bowel sounds are normal. She exhibits no distension. There is no tenderness. There is no rebound.  Neurological: She is alert and oriented to person,  place, and time. She has normal reflexes. She displays normal reflexes. No cranial nerve deficit. She exhibits normal muscle tone. Coordination abnormal.  Skin: Skin is warm. No rash noted. No erythema. No pallor.  Psychiatric: She has a normal mood and affect. Her behavior is normal. Judgment normal.  Patient seems a little confused about her thyroid test I just performed.        Assessment & Plan:  Ataxia - Plan: MR Brain W Wo Contrast  Memory loss of unknown cause - Plan: MR Brain W Wo Contrast  Regarding her thyroid, I would recheck a TSH the first week of April. It is too soon to recheck a TSH at this point. She is only been on 112 mcg of levothyroxine for 2 weeks. I am concerned about her episodic ataxia and abnormal cerebellar exam today. I will schedule the patient for an MRI to evaluate for stroke in the cerebellum or pathologic process in the posterior fossa.

## 2013-05-10 ENCOUNTER — Ambulatory Visit (HOSPITAL_COMMUNITY)
Admission: RE | Admit: 2013-05-10 | Discharge: 2013-05-10 | Disposition: A | Discharge status: Home / Self Care | Payer: Medicare Other | Source: Ambulatory Visit | Attending: Family Medicine | Admitting: Family Medicine

## 2013-05-10 ENCOUNTER — Encounter (HOSPITAL_COMMUNITY): Payer: Self-pay

## 2013-05-10 DIAGNOSIS — R413 Other amnesia: Secondary | ICD-10-CM

## 2013-05-10 DIAGNOSIS — R279 Unspecified lack of coordination: Secondary | ICD-10-CM | POA: Insufficient documentation

## 2013-05-10 DIAGNOSIS — J320 Chronic maxillary sinusitis: Secondary | ICD-10-CM | POA: Insufficient documentation

## 2013-05-10 DIAGNOSIS — D496 Neoplasm of unspecified behavior of brain: Secondary | ICD-10-CM | POA: Diagnosis not present

## 2013-05-10 DIAGNOSIS — R270 Ataxia, unspecified: Secondary | ICD-10-CM

## 2013-05-10 MED ORDER — GADOBENATE DIMEGLUMINE 529 MG/ML IV SOLN
15.0000 mL | Freq: Once | INTRAVENOUS | Status: AC | PRN
  Administered 2013-05-10: 15 mL via INTRAVENOUS

## 2013-05-13 ENCOUNTER — Telehealth: Payer: Self-pay | Admitting: Family Medicine

## 2013-05-13 ENCOUNTER — Other Ambulatory Visit: Payer: Self-pay | Admitting: Family Medicine

## 2013-05-13 DIAGNOSIS — D332 Benign neoplasm of brain, unspecified: Secondary | ICD-10-CM

## 2013-05-13 NOTE — Telephone Encounter (Signed)
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

## 2013-05-14 ENCOUNTER — Other Ambulatory Visit: Payer: Self-pay | Admitting: Family Medicine

## 2013-05-14 NOTE — Telephone Encounter (Signed)
ok 

## 2013-05-14 NOTE — Telephone Encounter (Signed)
?   OK to Refill  

## 2013-05-21 DIAGNOSIS — D496 Neoplasm of unspecified behavior of brain: Secondary | ICD-10-CM | POA: Diagnosis not present

## 2013-05-21 DIAGNOSIS — Z6829 Body mass index (BMI) 29.0-29.9, adult: Secondary | ICD-10-CM | POA: Diagnosis not present

## 2013-05-31 ENCOUNTER — Other Ambulatory Visit (HOSPITAL_COMMUNITY): Payer: Self-pay | Admitting: Neurosurgery

## 2013-05-31 DIAGNOSIS — D496 Neoplasm of unspecified behavior of brain: Secondary | ICD-10-CM

## 2013-06-10 ENCOUNTER — Ambulatory Visit (INDEPENDENT_AMBULATORY_CARE_PROVIDER_SITE_OTHER): Payer: Medicare Other | Admitting: Family Medicine

## 2013-06-10 ENCOUNTER — Encounter: Payer: Self-pay | Admitting: Family Medicine

## 2013-06-10 VITALS — BP 100/70 | HR 84 | Temp 97.5°F | Resp 16 | Ht 68.0 in | Wt 182.0 lb

## 2013-06-10 DIAGNOSIS — E039 Hypothyroidism, unspecified: Secondary | ICD-10-CM

## 2013-06-10 DIAGNOSIS — R42 Dizziness and giddiness: Secondary | ICD-10-CM

## 2013-06-10 LAB — TSH: TSH: 2.956 u[IU]/mL (ref 0.350–4.500)

## 2013-06-10 NOTE — Progress Notes (Signed)
Subjective:    Patient ID: Shari Branch, female    DOB: Sep 06, 1945, 68 y.o.   MRN: [id number]  HPI 05/06/13 I recently saw this patient on February 11. She is complaining of the same problems.  He does not remember rechecking her thyroid level and asking her to return after in 6 weeks. She is also complaining of ataxia. This is been going on for the last 3 months. She is staggering to the left and right occasionally when she walks. It is occurring on a daily basis. On examination today she is unable to perform heel-to-toe rapid heart rate without falling to the side and bracing herself against the wall. She is able to she has a difficult time turning. She also has an abnormal Romberg exam in that had to brace the patient when she closed her eyes. She is able to perform finger to nose testing without difficulty. The remainder of her neurologic exam is normal with no evidence of any cranial nerve deficiency. She has chronic hearing loss. She denies any new hearing loss, tinnitus, or vertigo. The dizziness and ataxia is not related to position changes. Denies any orthostatic dizziness.  At that time, my plan was: Unspecified hypothyroidism - Plan: TSH  Dizziness and giddiness  Regarding her thyroid, I would recheck a TSH the first week of April. It is too soon to recheck a TSH at this point. She is only been on 112 mcg of levothyroxine for 2 weeks. I am concerned about her episodic ataxia and abnormal cerebellar exam today. I will schedule the patient for an MRI to evaluate for stroke in the cerebellum or pathologic process in the posterior fossa.  06/10/13 Mri revealed: IMPRESSION:  1. 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 Other intraventricular lesions typically enhance.  2. Scattered periventricular and subcortical T2 hyperintensities are  greater than expected for age. The finding is  nonspecific but can be  seen in the setting of chronic microvascular ischemia, a  demyelinating process such as multiple sclerosis, vasculitis,  complicated migraine headaches, or as the sequelae of a prior  infectious or inflammatory process.  3. Minimal left maxillary sinus disease.  Patient has seen a neurosurgeon.  They do not feel but the intraventricular mass is causing her dizziness. They are going to monitor the mass everything month with an MRI. She is here today to discuss the dizziness and recheck her TSH. Of note she is taking several medications which can cause dizziness. She takes 250 mg of amitriptyline every day. This can certainly cause dizziness, fatigue, and memory loss particularly as a person ages. She is also taking Lexapro 20 mg by mouth daily which can cause dizziness in 7% of patients.   Past Medical History  Diagnosis Date  . Thyroid disease   . Hyperlipidemia   . Trigeminal neuralgia     Trigemninal Tumor- Left, resected [employer]    Current Outpatient Prescriptions on File Prior to Visit  Medication Sig Dispense Refill  . amitriptyline (ELAVIL) 50 MG tablet TAKE FIVE TABLETS BY MOUTH ONCE DAILY AT BEDTIME  150 tablet  5  . butalbital-acetaminophen-caffeine (FIORICET, ESGIC) 50-325-40 MG per tablet TAKE ONE TABLET BY MOUTH AS NEEDED FOR  MIGRAINE  30 tablet  0  . butalbital-aspirin-caffeine (FIORINAL) 50-325-40 MG per capsule TAKE ONE CAPSULE BY MOUTH AS NEEDED FOR  MIGRAINES  30 capsule  0  . Cholecalciferol (  VITAMIN D-3) 5000 UNITS TABS Take 5,000 Units by mouth.      . clobetasol cream (TEMOVATE) 0.05 % Apply topically 2 (two) times daily.      . Cyanocobalamin (B-12) 1000 MCG CAPS Take 1,000 mg by mouth daily.      Marland Kitchen escitalopram (LEXAPRO) 20 MG tablet TAKE ONE TABLET BY MOUTH ONCE DAILY  30 tablet  5  . Ginkgo Biloba 200 MG CAPS Take by mouth daily.      Marland Kitchen ibuprofen (ADVIL,MOTRIN) 800 MG tablet Take 1 tablet (800 mg total) by mouth every 8 (eight)  hours as needed for pain.  60 tablet  0  . levothyroxine (SYNTHROID, LEVOTHROID) 112 MCG tablet Take 1 tablet (112 mcg total) by mouth daily.  30 tablet  1  . Magnesium 500 MG TABS Take 500 mg by mouth daily.      . naproxen (NAPROSYN) 500 MG tablet Take 1 tablet (500 mg total) by mouth 2 (two) times daily with a meal.  60 tablet  0  . pantoprazole (PROTONIX) 40 MG tablet TAKE ONE TABLET BY MOUTH TWICE DAILY  180 tablet  4   No current facility-administered medications on file prior to visit.   Allergies  Allergen Reactions  . Sulfonamide Derivatives     REACTION: Joint Pain and locking - walks like a 68 year old   History   Social History  . Marital Status: Married    Spouse Name: N/A    Number of Children: N/A  . Years of Education: N/A   Occupational History  . Not on file.   Social History Main Topics  . Smoking status: Current Every Day Smoker -- 1.50 packs/day  . Smokeless tobacco: Not on file  . Alcohol Use: No  . Drug Use: No  . Sexual Activity:    Other Topics Concern  . Not on file   Social History Narrative  . No narrative on file   Past Medical History  Diagnosis Date  . Thyroid disease   . Hyperlipidemia   . Trigeminal neuralgia     Trigemninal Tumor- Left, resected [employer]       Review of Systems  All other systems reviewed and are negative.       Objective:   Physical Exam  Vitals reviewed. Constitutional: She is oriented to person, place, and time.  Cardiovascular: Normal rate, regular rhythm and normal heart sounds.   Pulmonary/Chest: Effort normal and breath sounds normal. No respiratory distress. She has no wheezes. She has no rales.  Abdominal: Soft. Bowel sounds are normal. She exhibits no distension. There is no tenderness. There is no rebound.  Neurological: She is alert and oriented to person, place, and time. She has normal reflexes. No cranial nerve deficit. She exhibits normal muscle tone. Coordination abnormal.    Skin: Skin is warm. No rash noted. No erythema. No pallor.  Psychiatric: She has a normal mood and affect. Her behavior is normal. Judgment normal.         Assessment & Plan:  1. Unspecified hypothyroidism Repeat TSH. Continue to titrate levothyroxin and fell medication is within therapeutic range. - TSH  2. Dizziness and giddiness I want to start weaning the patient off amitriptyline to see if this may be contributing to her memory loss, fatigue, and dizziness. Decrease by one tablet every week until she is taking 25 mg by mouth each bedtime.  She is currently taking 5 tablets every day.  Recheck in one month.

## 2013-06-23 ENCOUNTER — Other Ambulatory Visit: Payer: Self-pay | Admitting: Family Medicine

## 2013-08-09 DIAGNOSIS — M79609 Pain in unspecified limb: Secondary | ICD-10-CM | POA: Diagnosis not present

## 2013-08-09 DIAGNOSIS — M6740 Ganglion, unspecified site: Secondary | ICD-10-CM | POA: Diagnosis not present

## 2013-08-14 ENCOUNTER — Ambulatory Visit (HOSPITAL_COMMUNITY)
Admission: RE | Admit: 2013-08-14 | Discharge: 2013-08-14 | Disposition: A | Discharge status: Home / Self Care | Payer: Medicare Other | Source: Ambulatory Visit | Attending: Neurosurgery | Admitting: Neurosurgery

## 2013-08-14 ENCOUNTER — Encounter (HOSPITAL_COMMUNITY): Payer: Self-pay

## 2013-08-14 DIAGNOSIS — D496 Neoplasm of unspecified behavior of brain: Secondary | ICD-10-CM | POA: Diagnosis not present

## 2013-08-14 LAB — POCT I-STAT CREATININE: CREATININE: 0.9 mg/dL (ref 0.50–1.10)

## 2013-08-14 MED ORDER — GADOBENATE DIMEGLUMINE 529 MG/ML IV SOLN
15.0000 mL | Freq: Once | INTRAVENOUS | Status: AC | PRN
  Administered 2013-08-14: 15 mL via INTRAVENOUS

## 2013-08-19 DIAGNOSIS — H02039 Senile entropion of unspecified eye, unspecified eyelid: Secondary | ICD-10-CM | POA: Diagnosis not present

## 2013-08-20 DIAGNOSIS — D496 Neoplasm of unspecified behavior of brain: Secondary | ICD-10-CM | POA: Diagnosis not present

## 2013-08-20 DIAGNOSIS — Z6828 Body mass index (BMI) 28.0-28.9, adult: Secondary | ICD-10-CM | POA: Diagnosis not present

## 2013-08-27 DIAGNOSIS — M79609 Pain in unspecified limb: Secondary | ICD-10-CM | POA: Diagnosis not present

## 2013-08-27 DIAGNOSIS — M6740 Ganglion, unspecified site: Secondary | ICD-10-CM | POA: Diagnosis not present

## 2013-08-29 ENCOUNTER — Other Ambulatory Visit: Payer: Self-pay | Admitting: Podiatry

## 2013-08-30 NOTE — Addendum Note (Signed)
Addended by: Caprice Beaver on: 08/30/2013 07:08 AM   Modules accepted: Orders

## 2013-09-05 ENCOUNTER — Encounter (HOSPITAL_COMMUNITY): Payer: Self-pay | Admitting: Pharmacy Technician

## 2013-09-10 DIAGNOSIS — M6740 Ganglion, unspecified site: Secondary | ICD-10-CM | POA: Diagnosis not present

## 2013-09-10 DIAGNOSIS — M79609 Pain in unspecified limb: Secondary | ICD-10-CM | POA: Diagnosis not present

## 2013-09-10 NOTE — Patient Instructions (Signed)
Shari Branch  09/10/2013   Your procedure is scheduled on:  Thursday, 09/19/13  Report to [name] at [facility].  Call this number if you have problems the morning of surgery: [phone]   Remember:   Do not eat food or drink liquids after midnight.   Take these medicines the morning of surgery with A SIP OF WATER: lexapro, levothyroxine   Do not wear jewelry, make-up or nail polish.  Do not wear lotions, powders, or perfumes. You may wear deodorant.  Do not shave 48 hours prior to surgery. Men may shave face and neck.  Do not bring valuables to the hospital.  [name] is not responsible                  for any belongings or valuables.               Contacts, dentures or bridgework may not be worn into surgery.  Leave suitcase in the car. After surgery it may be brought to your room.  For patients admitted to the hospital, discharge time is determined by your                treatment team.               Patients discharged the day of surgery will not be allowed to drive  home.  Name and phone number of your driver: family  Special Instructions: Shower using CHG 2 nights before surgery and the night before surgery.  If you shower the day of surgery use CHG.  Use special wash - you have one bottle of CHG for all showers.  You should use approximately 1/3 of the bottle for each shower.   Please read over the following fact sheets that you were given: Anesthesia Post-op Instructions and Care and Recovery After Surgery  [name] A ganglion cyst is a noncancerous, fluid-filled lump that occurs near joints or tendons. The ganglion cyst grows out of a joint or the lining of a tendon. It most often develops in the hand or wrist but can also develop in the shoulder, elbow, hip, knee, ankle, or foot. The round or oval ganglion can be pea sized or larger than a grape. Increased activity may enlarge the size of the cyst because more fluid starts to build up.  CAUSES  It is not completely  known what causes a ganglion cyst to grow. However, it may be related to:  Inflammation or irritation around the joint.  An injury.  Repetitive movements or overuse.  Arthritis. SYMPTOMS  A lump most often appears in the hand or wrist, but can occur in other areas of the body. Generally, the lump is painless without other symptoms. However, sometimes pain can be felt during activity or when pressure is applied to the lump. The lump may even be tender to the touch. Tingling, pain, numbness, or muscle weakness can occur if the ganglion cyst presses on a nerve. Your grip may be weak and you may have less movement in your joints.  DIAGNOSIS  Ganglion cysts are most often diagnosed based on a physical exam, noting where the cyst is and how it looks. Your caregiver will feel the lump and may shine a light alongside it. If it is a ganglion, a light often shines through it. Your caregiver may order an X-ray, ultrasound, or MRI to rule out other conditions. TREATMENT  Ganglions usually go away on their own without treatment. If pain or other  symptoms are involved, treatment may be needed. Treatment is also needed if the ganglion limits your movement or if it gets infected. Treatment options include:  Wearing a wrist or finger brace or splint.  Taking anti-inflammatory medicine.  Draining fluid from the lump with a needle (aspiration).  Injecting a steroid into the joint.  Surgery to remove the ganglion cyst and its stalk that is attached to the joint or tendon. However, ganglion cysts can grow back. HOME CARE INSTRUCTIONS   Do not press on the ganglion, poke it with a needle, or hit it with a heavy object. You may rub the lump gently and often. Sometimes fluid moves out of the cyst.  Only take medicines as directed by your caregiver.  Wear your brace or splint as directed by your caregiver. SEEK MEDICAL CARE IF:   Your ganglion becomes larger or more painful.  You have increased redness,  red streaks, or swelling.  You have pus coming from the lump.  You have weakness or numbness in the affected area. MAKE SURE YOU:   Understand these instructions.  Will watch your condition.  Will get help right away if you are not doing well or get worse. Document Released: 02/19/2000 Document Revised: 11/16/2011 Document Reviewed: 04/17/2007 [name] Patient Information 2015 [name], [place]. This information is not intended to replace advice given to you by your health care provider. Make sure you discuss any questions you have with your health care provider.  PATIENT INSTRUCTIONS POST-ANESTHESIA  IMMEDIATELY FOLLOWING SURGERY:  Do not drive or operate machinery for the first twenty four hours after surgery.  Do not make any important decisions for twenty four hours after surgery or while taking narcotic pain medications or sedatives.  If you develop intractable nausea and vomiting or a severe headache please notify your doctor immediately.  FOLLOW-UP:  Please make an appointment with your surgeon as instructed. You do not need to follow up with anesthesia unless specifically instructed to do so.  WOUND CARE INSTRUCTIONS (if applicable):  Keep a dry clean dressing on the anesthesia/puncture wound site if there is drainage.  Once the wound has quit draining you may leave it open to air.  Generally you should leave the bandage intact for twenty four hours unless there is drainage.  If the epidural site drains for more than 36-48 hours please call the anesthesia department.  QUESTIONS?:  Please feel free to call your physician or the hospital operator if you have any questions, and they will be happy to assist you.

## 2013-09-11 ENCOUNTER — Encounter (HOSPITAL_COMMUNITY)
Admission: RE | Admit: 2013-09-11 | Discharge: 2013-09-11 | Disposition: A | Discharge status: Home / Self Care | Payer: Medicare Other | Source: Ambulatory Visit | Attending: Podiatry | Admitting: Podiatry

## 2013-09-11 ENCOUNTER — Encounter (HOSPITAL_COMMUNITY): Payer: Self-pay

## 2013-09-11 DIAGNOSIS — Z01812 Encounter for preprocedural laboratory examination: Secondary | ICD-10-CM | POA: Diagnosis not present

## 2013-09-11 LAB — BASIC METABOLIC PANEL
ANION GAP: 12 (ref 5–15)
BUN: 7 mg/dL (ref 6–23)
CALCIUM: 9.3 mg/dL (ref 8.4–10.5)
CO2: 24 mEq/L (ref 19–32)
CREATININE: 0.73 mg/dL (ref 0.50–1.10)
Chloride: 102 mEq/L (ref 96–112)
GFR calc non Af Amer: 86 mL/min — ABNORMAL LOW (ref >90)
Glucose, Bld: 104 mg/dL — ABNORMAL HIGH (ref 70–99)
Potassium: 4.6 mEq/L (ref 3.7–5.3)
Sodium: 138 mEq/L (ref 137–147)

## 2013-09-11 LAB — HEMOGLOBIN AND HEMATOCRIT, BLOOD
HEMATOCRIT: 38.6 % (ref 36.0–46.0)
HEMOGLOBIN: 13.4 g/dL (ref 12.0–15.0)

## 2013-09-17 ENCOUNTER — Other Ambulatory Visit: Payer: Self-pay | Admitting: Family Medicine

## 2013-09-17 NOTE — Telephone Encounter (Signed)
Refill appropriate and filled per protocol. 

## 2013-09-19 ENCOUNTER — Other Ambulatory Visit: Payer: Self-pay | Admitting: Family Medicine

## 2013-09-19 ENCOUNTER — Ambulatory Visit (HOSPITAL_COMMUNITY)
Admission: RE | Admit: 2013-09-19 | Discharge: 2013-09-19 | Disposition: A | Discharge status: Home / Self Care | Payer: Medicare Other | Source: Ambulatory Visit | Attending: Podiatry | Admitting: Podiatry

## 2013-09-19 ENCOUNTER — Encounter (HOSPITAL_COMMUNITY): Payer: Self-pay | Admitting: *Deleted

## 2013-09-19 ENCOUNTER — Encounter (HOSPITAL_COMMUNITY)
Admission: RE | Disposition: A | Discharge status: Home / Self Care | Payer: Self-pay | Source: Ambulatory Visit | Attending: Podiatry

## 2013-09-19 ENCOUNTER — Encounter (HOSPITAL_COMMUNITY): Payer: Medicare Other | Admitting: Anesthesiology

## 2013-09-19 ENCOUNTER — Ambulatory Visit (HOSPITAL_COMMUNITY): Payer: Medicare Other | Admitting: Anesthesiology

## 2013-09-19 DIAGNOSIS — E039 Hypothyroidism, unspecified: Secondary | ICD-10-CM | POA: Insufficient documentation

## 2013-09-19 DIAGNOSIS — Z79899 Other long term (current) drug therapy: Secondary | ICD-10-CM | POA: Insufficient documentation

## 2013-09-19 DIAGNOSIS — K219 Gastro-esophageal reflux disease without esophagitis: Secondary | ICD-10-CM | POA: Diagnosis not present

## 2013-09-19 DIAGNOSIS — M898X9 Other specified disorders of bone, unspecified site: Secondary | ICD-10-CM | POA: Insufficient documentation

## 2013-09-19 DIAGNOSIS — M79609 Pain in unspecified limb: Secondary | ICD-10-CM | POA: Diagnosis not present

## 2013-09-19 DIAGNOSIS — L723 Sebaceous cyst: Secondary | ICD-10-CM | POA: Diagnosis not present

## 2013-09-19 DIAGNOSIS — M6740 Ganglion, unspecified site: Secondary | ICD-10-CM | POA: Insufficient documentation

## 2013-09-19 DIAGNOSIS — F17200 Nicotine dependence, unspecified, uncomplicated: Secondary | ICD-10-CM | POA: Insufficient documentation

## 2013-09-19 DIAGNOSIS — M67479 Ganglion, unspecified ankle and foot: Secondary | ICD-10-CM

## 2013-09-19 HISTORY — PX: OSTECTOMY: SHX6439

## 2013-09-19 HISTORY — PX: GANGLION CYST EXCISION: SHX1691

## 2013-09-19 SURGERY — EXCISION, GANGLION CYST, FOOT
Anesthesia: Monitor Anesthesia Care | Site: Foot | Laterality: Right

## 2013-09-19 MED ORDER — FENTANYL CITRATE 0.05 MG/ML IJ SOLN: 25.0000 ug | INTRAMUSCULAR | Status: DC | PRN

## 2013-09-19 MED ORDER — SUCCINYLCHOLINE CHLORIDE 20 MG/ML IJ SOLN
INTRAMUSCULAR | Status: AC
  Filled 2013-09-19: qty 1

## 2013-09-19 MED ORDER — FENTANYL CITRATE 0.05 MG/ML IJ SOLN
INTRAMUSCULAR | Status: AC
  Filled 2013-09-19: qty 2

## 2013-09-19 MED ORDER — ONDANSETRON HCL 4 MG/2ML IJ SOLN
INTRAMUSCULAR | Status: AC
  Filled 2013-09-19: qty 2

## 2013-09-19 MED ORDER — MIDAZOLAM HCL 2 MG/2ML IJ SOLN
1.0000 mg | INTRAMUSCULAR | Status: DC | PRN
  Administered 2013-09-19: 2 mg via INTRAVENOUS

## 2013-09-19 MED ORDER — PROPOFOL INFUSION 10 MG/ML OPTIME
INTRAVENOUS | Status: DC | PRN
  Administered 2013-09-19: 65 ug/kg/min via INTRAVENOUS
  Administered 2013-09-19: 25 ug/kg/min via INTRAVENOUS

## 2013-09-19 MED ORDER — EPHEDRINE SULFATE 50 MG/ML IJ SOLN
INTRAMUSCULAR | Status: AC
  Filled 2013-09-19: qty 1

## 2013-09-19 MED ORDER — LACTATED RINGERS IV SOLN
INTRAVENOUS | Status: DC
  Administered 2013-09-19: 07:00:00 via INTRAVENOUS

## 2013-09-19 MED ORDER — LIDOCAINE HCL (PF) 1 % IJ SOLN
INTRAMUSCULAR | Status: AC
  Filled 2013-09-19: qty 5

## 2013-09-19 MED ORDER — GLYCOPYRROLATE 0.2 MG/ML IJ SOLN
INTRAMUSCULAR | Status: AC
  Filled 2013-09-19: qty 2

## 2013-09-19 MED ORDER — ONDANSETRON HCL 4 MG/2ML IJ SOLN
4.0000 mg | Freq: Once | INTRAMUSCULAR | Status: AC
  Administered 2013-09-19: 4 mg via INTRAVENOUS

## 2013-09-19 MED ORDER — CEFAZOLIN SODIUM-DEXTROSE 2-3 GM-% IV SOLR
INTRAVENOUS | Status: AC
  Filled 2013-09-19: qty 50

## 2013-09-19 MED ORDER — BUPIVACAINE HCL (PF) 0.5 % IJ SOLN
INTRAMUSCULAR | Status: AC
  Filled 2013-09-19: qty 30

## 2013-09-19 MED ORDER — PANTOPRAZOLE SODIUM 40 MG PO TBEC: DELAYED_RELEASE_TABLET | ORAL | Status: DC

## 2013-09-19 MED ORDER — ONDANSETRON HCL 4 MG/2ML IJ SOLN: 4.0000 mg | Freq: Once | INTRAMUSCULAR | Status: DC | PRN

## 2013-09-19 MED ORDER — 0.9 % SODIUM CHLORIDE (POUR BTL) OPTIME
TOPICAL | Status: DC | PRN
  Administered 2013-09-19: 1000 mL

## 2013-09-19 MED ORDER — CEFAZOLIN SODIUM-DEXTROSE 2-3 GM-% IV SOLR
2.0000 g | INTRAVENOUS | Status: AC
  Administered 2013-09-19: 2 g via INTRAVENOUS

## 2013-09-19 MED ORDER — BUPIVACAINE HCL (PF) 0.5 % IJ SOLN
INTRAMUSCULAR | Status: DC | PRN
Start: 2013-09-19 — End: 2013-09-19
  Administered 2013-09-19: 20 mL

## 2013-09-19 MED ORDER — MIDAZOLAM HCL 2 MG/2ML IJ SOLN
INTRAMUSCULAR | Status: AC
  Filled 2013-09-19: qty 2

## 2013-09-19 MED ORDER — FENTANYL CITRATE 0.05 MG/ML IJ SOLN
INTRAMUSCULAR | Status: DC | PRN
  Administered 2013-09-19 (×2): 25 ug via INTRAVENOUS

## 2013-09-19 MED ORDER — PROPOFOL 10 MG/ML IV BOLUS
INTRAVENOUS | Status: AC
  Filled 2013-09-19: qty 20

## 2013-09-19 MED ORDER — FENTANYL CITRATE 0.05 MG/ML IJ SOLN
25.0000 ug | INTRAMUSCULAR | Status: AC
  Administered 2013-09-19: 25 ug via INTRAVENOUS

## 2013-09-19 MED ORDER — LACTATED RINGERS IV SOLN
INTRAVENOUS | Status: DC | PRN
  Administered 2013-09-19: 07:00:00 via INTRAVENOUS

## 2013-09-19 SURGICAL SUPPLY — 47 items
APL SKNCLS STERI-STRIP NONHPOA (GAUZE/BANDAGES/DRESSINGS) ×1
BAG HAMPER (MISCELLANEOUS) ×3 IMPLANT
BANDAGE ELASTIC 4 VELCRO NS (GAUZE/BANDAGES/DRESSINGS) ×3 IMPLANT
BANDAGE ESMARK 4X12 BL STRL LF (DISPOSABLE) ×1 IMPLANT
BANDAGE GAUZE ELAST BULKY 4 IN (GAUZE/BANDAGES/DRESSINGS) ×2 IMPLANT
BENZOIN TINCTURE PRP APPL 2/3 (GAUZE/BANDAGES/DRESSINGS) ×3 IMPLANT
BLADE 15 SAFETY STRL DISP (BLADE) ×3 IMPLANT
BNDG CMPR 12X4 ELC STRL LF (DISPOSABLE) ×1
BNDG ESMARK 4X12 BLUE STRL LF (DISPOSABLE) ×3
BNDG GAUZE ELAST 4 BULKY (GAUZE/BANDAGES/DRESSINGS) ×3 IMPLANT
CHLORAPREP W/TINT 26ML (MISCELLANEOUS) ×3 IMPLANT
CLOSURE WOUND 1/4 X3 (GAUZE/BANDAGES/DRESSINGS) ×1
CLOTH BEACON ORANGE TIMEOUT ST (SAFETY) ×3 IMPLANT
COVER LIGHT HANDLE STERIS (MISCELLANEOUS) ×6 IMPLANT
CUFF TOURN SGL LL 12 (TOURNIQUET CUFF) ×3 IMPLANT
DECANTER SPIKE VIAL GLASS SM (MISCELLANEOUS) ×3 IMPLANT
DRSG ADAPTIC 3X8 NADH LF (GAUZE/BANDAGES/DRESSINGS) ×3 IMPLANT
ELECT REM PT RETURN 9FT ADLT (ELECTROSURGICAL) ×3
ELECTRODE REM PT RTRN 9FT ADLT (ELECTROSURGICAL) ×1 IMPLANT
FORMALIN 10 PREFIL 120ML (MISCELLANEOUS) ×2 IMPLANT
GAUZE SPONGE 4X4 12PLY STRL (GAUZE/BANDAGES/DRESSINGS) ×2 IMPLANT
GLOVE BIO SURGEON STRL SZ 6.5 (GLOVE) ×1 IMPLANT
GLOVE BIO SURGEON STRL SZ7.5 (GLOVE) ×3 IMPLANT
GLOVE BIO SURGEONS STRL SZ 6.5 (GLOVE) ×1
GLOVE BIOGEL PI IND STRL 6.5 (GLOVE) IMPLANT
GLOVE BIOGEL PI IND STRL 7.5 (GLOVE) IMPLANT
GLOVE BIOGEL PI INDICATOR 6.5 (GLOVE) ×2
GLOVE BIOGEL PI INDICATOR 7.5 (GLOVE) ×2
GLOVE EXAM NITRILE MD LF STRL (GLOVE) ×2 IMPLANT
GLOVE SURG SS PI 7.5 STRL IVOR (GLOVE) ×2 IMPLANT
GOWN STRL REUS W/TWL LRG LVL3 (GOWN DISPOSABLE) ×8 IMPLANT
KIT ROOM TURNOVER APOR (KITS) ×3 IMPLANT
MANIFOLD NEPTUNE II (INSTRUMENTS) ×3 IMPLANT
MARKER SKIN DUAL TIP RULER LAB (MISCELLANEOUS) ×3 IMPLANT
NDL HYPO 27GX1-1/4 (NEEDLE) ×2 IMPLANT
NEEDLE HYPO 27GX1-1/4 (NEEDLE) ×6 IMPLANT
NS IRRIG 1000ML POUR BTL (IV SOLUTION) ×3 IMPLANT
PACK BASIC LIMB (CUSTOM PROCEDURE TRAY) ×3 IMPLANT
PAD ARMBOARD 7.5X6 YLW CONV (MISCELLANEOUS) ×3 IMPLANT
RASP SM TEAR CROSS CUT (RASP) ×2 IMPLANT
SET BASIN LINEN APH (SET/KITS/TRAYS/PACK) ×3 IMPLANT
SPONGE GAUZE 4X4 12PLY (GAUZE/BANDAGES/DRESSINGS) ×2 IMPLANT
STRIP CLOSURE SKIN 1/4X3 (GAUZE/BANDAGES/DRESSINGS) ×2 IMPLANT
SUT PROLENE 4 0 PS 2 18 (SUTURE) ×2 IMPLANT
SUT VIC AB 4-0 PS2 27 (SUTURE) ×3 IMPLANT
SYRINGE CONTROL L 12CC (SYRINGE) ×6 IMPLANT
SYRINGE CONTROL LL 12CC (SYRINGE) ×2 IMPLANT

## 2013-09-19 NOTE — Telephone Encounter (Signed)
Rx Refilled  

## 2013-09-19 NOTE — Transfer of Care (Signed)
Immediate Anesthesia Transfer of Care Note  Patient: Shari Branch  Procedure(s) Performed: Procedure(s): EXCISION [name] FOOT (Right) OSTECTOMY (Right)  Patient Location: PACU  Anesthesia Type:MAC  Level of Consciousness: awake, alert , oriented and patient cooperative  Airway & Oxygen Therapy: Patient Spontanous Breathing and Patient connected to nasal cannula oxygen  Post-op Assessment: Report given to PACU RN and Post -op Vital signs reviewed and stable  Post vital signs: stable  Complications: No apparent anesthesia complications

## 2013-09-19 NOTE — Anesthesia Preprocedure Evaluation (Signed)
Anesthesia Evaluation  Patient identified by MRN, date of birth, ID band Patient awake    Reviewed: Allergy & Precautions, H&P , NPO status , Patient's Chart, lab work & pertinent test results  Airway Mallampati: III TM Distance: <3 FB Neck ROM: Full    Dental  (+) Teeth Intact   Pulmonary Current Smoker,  breath sounds clear to auscultation        Cardiovascular negative cardio ROS  Rhythm:Regular Rate:Normal     Neuro/Psych  Headaches,    GI/Hepatic GERD-  Medicated and Controlled,  Endo/Other  Hypothyroidism   Renal/GU      Musculoskeletal   Abdominal   Peds  Hematology   Anesthesia Other Findings L eye irritation & erythema from eyelashes.  Reproductive/Obstetrics                           Anesthesia Physical Anesthesia Plan  ASA: II  Anesthesia Plan: MAC   Post-op Pain Management:    Induction: Intravenous  Airway Management Planned: Nasal Cannula  Additional Equipment:   Intra-op Plan:   Post-operative Plan:   Informed Consent: I have reviewed the patients History and Physical, chart, labs and discussed the procedure including the risks, benefits and alternatives for the proposed anesthesia with the patient or authorized representative who has indicated his/her understanding and acceptance.     Plan Discussed with:   Anesthesia Plan Comments:         Anesthesia Quick Evaluation

## 2013-09-19 NOTE — Anesthesia Postprocedure Evaluation (Signed)
  Anesthesia Post-op Note  Patient: Shari Branch  Procedure(s) Performed: Procedure(s): EXCISION [name] FOOT (Right) OSTECTOMY (Right)  Patient Location: PACU  Anesthesia Type:MAC  Level of Consciousness: awake, alert , oriented and patient cooperative  Airway and Oxygen Therapy: Patient Spontanous Breathing and Patient connected to nasal cannula oxygen  Post-op Pain: none  Post-op Assessment: Post-op Vital signs reviewed  Post-op Vital Signs: stable  Last Vitals:  Filed Vitals:   09/19/13 0639  BP: 105/74  Pulse: 81  Temp: 36.7 C  Resp: 18    Complications: No apparent anesthesia complications

## 2013-09-19 NOTE — Op Note (Signed)
OPERATIVE NOTE  DATE OF PROCEDURE:  09/19/2013  SURGEON:   Marcheta Grammes, DPM  OR STAFF:   Circulator: Kristopher Oppenheim Protzek, RN Scrub Person: Romero Liner, CST; Shelda Jakes, English as a second language teacher: Effie Berkshire, RN   PREOPERATIVE DIAGNOSIS:   1.  [name], right foot  POSTOPERATIVE DIAGNOSIS: 1.  [name], right foot 2.  Exostosis, right foot  PROCEDURE: 1.  Excision of cyst, right foot 2.  Ostectomy, right foot  ANESTHESIA:  Monitor Anesthesia Care   HEMOSTASIS:   Pneumatic ankle tourniquet set at 250 mmHg  ESTIMATED BLOOD LOSS:   Minimal (<5 cc)  MATERIALS USED:  None  INJECTABLES: Marcaine 0.5% plain; 20mL  PATHOLOGY:   Soft tissue mass, right foot  COMPLICATIONS:   None  INDICATIONS:  Painful soft tissue mass of the right foot  DESCRIPTION OF THE PROCEDURE:   The patient was brought to the operating room and placed on the operative table in the supine position.  A pneumatic ankle tourniquet was applied to the patient's ankle.  Following sedation, the surgical site was anesthetized with 0.5% Marcaine plain.  The foot was then prepped, scrubbed, and draped in the usual sterile technique.  The foot was elevated, exsanguinated and the pneumatic ankle tourniquet inflated to 250 mmHg.    Attention was directed to the dorsomedial aspect of the right foot.  A linear longitudinal incision was made medial and parallel to the extensor hallucis longus tendon.  Dissection was continued deep down to the level of the soft tissue mass overlying the first metatarsal head.  The soft tissue mass was found to be well encapsulated and adherent to the medial terminal branch of the medial dorsal cutaneous nerve.  The soft tissue mass was freed of all attachments, removed and passed from the operative field.  Firm dorsal contraction of the dorsomedial aspect of the first metatarsal head was identified.  A linear longitudinal capsular incision was made.   The capsule was reflected medially and laterally thus exposing the osseous prominence.  The area was smoothed with a power rasp.  The wound was irrigated with amounts of sterile irrigant.  The capsule was reapproximated using 4-0 Vicryl in a simple suture technique.  The subcutaneous structures were reapproximated using 4-0 Vicryl.  The skin was reapproximated using 4-0 Prolene.  The incision closure was reinforced with Steri-Strips.  A sterile compressive dressing was applied to the right foot.  The pneumatic ankle tourniquet was deflated and a prompt hyperemic response was noted to all digits of the right foot.  The patient tolerated the procedure well.  The patient was then transferred to PACU with vital signs stable and vascular status intact to all toes of the operative foot.  Following a period of postoperative monitoring, the patient will be discharged home.

## 2013-09-19 NOTE — H&P (Signed)
HISTORY AND PHYSICAL INTERVAL NOTE:  09/19/2013  7:27 AM  Shari Branch  has presented today for surgery, with the diagnosis of ganglion cyst right foot.  The various methods of treatment have been discussed with the patient.  No guarantees were given.  After consideration of risks, benefits and other options for treatment, the patient has consented to surgery.  I have reviewed the patients' chart and labs.    Patient Vitals for the past 24 hrs:  BP Temp Temp src Pulse Resp SpO2  09/19/13 0639 105/74 mmHg 98.1 F (36.7 C) Oral 81 18 94 %    A history and physical examination was performed in my office.  The patient was reexamined.  There have been no changes to this history and physical examination.  Shari Branch, DPM

## 2013-09-20 ENCOUNTER — Encounter (HOSPITAL_COMMUNITY): Payer: Self-pay | Admitting: Podiatry

## 2013-09-26 ENCOUNTER — Other Ambulatory Visit: Payer: Self-pay | Admitting: Family Medicine

## 2013-09-27 NOTE — Telephone Encounter (Signed)
Refill appropriate and filled per protocol. 

## 2013-10-17 DIAGNOSIS — H02039 Senile entropion of unspecified eye, unspecified eyelid: Secondary | ICD-10-CM | POA: Diagnosis not present

## 2013-11-19 ENCOUNTER — Ambulatory Visit: Payer: Self-pay

## 2013-11-19 DIAGNOSIS — Z79899 Other long term (current) drug therapy: Secondary | ICD-10-CM | POA: Diagnosis not present

## 2013-11-19 DIAGNOSIS — G500 Trigeminal neuralgia: Secondary | ICD-10-CM | POA: Diagnosis not present

## 2013-11-19 DIAGNOSIS — M19049 Primary osteoarthritis, unspecified hand: Secondary | ICD-10-CM | POA: Diagnosis not present

## 2013-11-19 DIAGNOSIS — Z882 Allergy status to sulfonamides status: Secondary | ICD-10-CM | POA: Diagnosis not present

## 2013-11-19 DIAGNOSIS — H02049 Spastic entropion of unspecified eye, unspecified eyelid: Secondary | ICD-10-CM | POA: Diagnosis not present

## 2013-11-19 DIAGNOSIS — F17200 Nicotine dependence, unspecified, uncomplicated: Secondary | ICD-10-CM | POA: Diagnosis not present

## 2013-11-19 DIAGNOSIS — K219 Gastro-esophageal reflux disease without esophagitis: Secondary | ICD-10-CM | POA: Diagnosis not present

## 2013-11-19 DIAGNOSIS — E079 Disorder of thyroid, unspecified: Secondary | ICD-10-CM | POA: Diagnosis not present

## 2013-11-19 DIAGNOSIS — R209 Unspecified disturbances of skin sensation: Secondary | ICD-10-CM | POA: Diagnosis not present

## 2013-11-19 DIAGNOSIS — H02039 Senile entropion of unspecified eye, unspecified eyelid: Secondary | ICD-10-CM | POA: Diagnosis not present

## 2013-11-19 DIAGNOSIS — H02009 Unspecified entropion of unspecified eye, unspecified eyelid: Secondary | ICD-10-CM | POA: Diagnosis not present

## 2013-11-23 ENCOUNTER — Other Ambulatory Visit: Payer: Self-pay | Admitting: Family Medicine

## 2013-11-23 NOTE — Telephone Encounter (Signed)
Per MD notes on 06/30/2013,  " Dizziness and giddiness  I want to start weaning the patient off amitriptyline to see if this may be contributing to her memory loss, fatigue, and dizziness. Decrease by one tablet every week until she is taking 25 mg by mouth each bedtime. She is currently taking 5 tablets every day. Recheck in one month."  Patient has not F/U with provider.   Refill denied until patient contacts office.   Letter sent.

## 2013-11-27 ENCOUNTER — Other Ambulatory Visit: Payer: Self-pay | Admitting: Family Medicine

## 2013-11-27 NOTE — Telephone Encounter (Signed)
LOV 06/2013  Told to follow up in one month.  Takes "5" Elavil at bedtime.  See no refills in system??  OK refill?

## 2013-12-16 ENCOUNTER — Encounter: Payer: Self-pay | Admitting: Family Medicine

## 2013-12-16 ENCOUNTER — Ambulatory Visit (INDEPENDENT_AMBULATORY_CARE_PROVIDER_SITE_OTHER): Payer: Medicare Other | Admitting: Family Medicine

## 2013-12-16 VITALS — BP 118/68 | HR 84 | Temp 98.3°F | Resp 18 | Ht 68.0 in | Wt 174.0 lb

## 2013-12-16 DIAGNOSIS — R1012 Left upper quadrant pain: Secondary | ICD-10-CM

## 2013-12-16 DIAGNOSIS — K760 Fatty (change of) liver, not elsewhere classified: Secondary | ICD-10-CM | POA: Insufficient documentation

## 2013-12-16 DIAGNOSIS — E038 Other specified hypothyroidism: Secondary | ICD-10-CM | POA: Diagnosis not present

## 2013-12-16 DIAGNOSIS — D1803 Hemangioma of intra-abdominal structures: Secondary | ICD-10-CM | POA: Diagnosis not present

## 2013-12-16 NOTE — Progress Notes (Signed)
Subjective:    Patient ID: Shari Branch, female    DOB: 1945/12/07, 68 y.o.   MRN: [id number]  HPI In 2010, the patient had an MRI of her liver which revealed 2 lesions concerning for malignancy. 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 She was also found to have hepatic steatosis. She was followed annually in [place] until 2012 when she quit due to the travel time.  She now complains of left upper quadrant abdominal discomfort. It is a constant pressure-like pain. There is no exacerbating or alleviating factors. It is unrelated to food or exertion. She also reports abdominal bloating. There is no abdominal distention today on exam. She denies melena or hematochezia. She is overdue for mammogram but refuses a mammogram. She is due for a colonoscopy. She also has hypothyroidism and is due for repeat TSH. She is also due for a flu shot. She denies any recent weight loss. There is no evidence of jaundice on examination. She has no kernicterus. Past Medical History  Diagnosis Date  . Thyroid disease   . Hyperlipidemia   . Trigeminal neuralgia     Trigemninal Tumor- Left, resected [employer]   . Liver hemangioma   . Hepatic steatosis    Past Surgical History  Procedure Laterality Date  . Tonsillectomy    . Abdominal hysterectomy    . Cholecystectomy    .  trigeminal nerve tumor      numbness on left side of face  . Ear pinna reconstruction w/ rib graft      7 surgeries starting at age 65  . Breast reduction surgery    . [name] excision Right 09/19/2013    Procedure: EXCISION [name] FOOT;  Surgeon: Marcheta Grammes, DPM;  Location: [facility];  Service: Podiatry;  Laterality: Right;  . Ostectomy Right 09/19/2013    Procedure: OSTECTOMY;  Surgeon: Marcheta Grammes, DPM;  Location: [facility];  Service: Podiatry;  Laterality: Right;   Current Outpatient Prescriptions on File Prior to Visit  Medication Sig Dispense  Refill  . amitriptyline (ELAVIL) 50 MG tablet TAKE FIVE TABLETS BY MOUTH ONCE DAILY AT BEDTIME  150 tablet  0  . butalbital-acetaminophen-caffeine (FIORICET, ESGIC) 50-325-40 MG per tablet Take 1 tablet by mouth as needed for headache.      . Cholecalciferol (VITAMIN D-3) 5000 UNITS TABS Take 5,000 Units by mouth.      . clobetasol cream (TEMOVATE) 0.05 % Apply topically 2 (two) times daily.      . Cyanocobalamin (B-12) 1000 MCG CAPS Take 1,000 mg by mouth daily.      Marland Kitchen escitalopram (LEXAPRO) 20 MG tablet TAKE ONE TABLET BY MOUTH ONCE DAILY  30 tablet  6  . levothyroxine (SYNTHROID, LEVOTHROID) 112 MCG tablet Take 112 mcg by mouth daily before breakfast.      . Magnesium 500 MG TABS Take 500 mg by mouth daily.      . naproxen (NAPROSYN) 500 MG tablet Take 1 tablet (500 mg total) by mouth 2 (two) times daily with a meal.  60 tablet  0  . pantoprazole (PROTONIX) 40 MG tablet TAKE ONE TABLET BY MOUTH TWICE DAILY  180 tablet  3   No current facility-administered medications on file prior to visit.  Marland Kitchenall History   Social History  . Marital Status: Married    Spouse Name: N/A    Number of Children: N/A  . Years of Education: N/A   Occupational History  .  Not on file.   Social History Main Topics  . Smoking status: Current Every Day Smoker -- 1.50 packs/day  . Smokeless tobacco: Not on file  . Alcohol Use: No  . Drug Use: No  . Sexual Activity: Not on file   Other Topics Concern  . Not on file   Social History Narrative  . No narrative on file      Review of Systems  All other systems reviewed and are negative.      Objective:   Physical Exam  Vitals reviewed. Constitutional: She appears well-developed and well-nourished. No distress.  Neck: No JVD present. No thyromegaly present.  Cardiovascular: Normal rate, regular rhythm and normal heart sounds.   No murmur heard. Pulmonary/Chest: Effort normal and breath sounds normal. No respiratory distress. She has no wheezes.  She has no rales. She exhibits no tenderness.  Abdominal: Soft. Bowel sounds are normal. She exhibits no distension. There is tenderness. There is no rebound and no guarding.  Musculoskeletal: She exhibits no edema.  Skin: No rash noted. She is not diaphoretic.          Assessment & Plan:  Other specified hypothyroidism - Plan: TSH  Left upper quadrant pain - Plan: CBC with Differential, COMPLETE METABOLIC PANEL WITH GFR, MR Abdomen W Wo Contrast  Hemangioma of liver - Plan: MR Abdomen W Wo Contrast  I will check a TSH. The patient is due for her six-month TSH to monitor her hypothyroidism. The patient also received her flu shot today. The remainder of her immunizations are up to date. Her mammogram is due but she declines this today. She is also due for a colonoscopy as well as a GI consult. Given her history of liver mass and no followup in the last 3 years I would like to obtain an MRI of the abdomen and pelvis to evaluate for any growth of the lesions on her liver and see if this may be contributing to any of her abdominal discomfort.

## 2013-12-17 LAB — CBC WITH DIFFERENTIAL/PLATELET
BASOS ABS: 0.1 10*3/uL (ref 0.0–0.1)
Basophils Relative: 1 % (ref 0–1)
EOS PCT: 2 % (ref 0–5)
Eosinophils Absolute: 0.1 10*3/uL (ref 0.0–0.7)
HCT: 40 % (ref 36.0–46.0)
Hemoglobin: 13.7 g/dL (ref 12.0–15.0)
LYMPHS ABS: 1.9 10*3/uL (ref 0.7–4.0)
LYMPHS PCT: 33 % (ref 12–46)
MCH: 31 pg (ref 26.0–34.0)
MCHC: 34.3 g/dL (ref 30.0–36.0)
MCV: 90.5 fL (ref 78.0–100.0)
Monocytes Absolute: 0.3 10*3/uL (ref 0.1–1.0)
Monocytes Relative: 5 % (ref 3–12)
NEUTROS PCT: 59 % (ref 43–77)
Neutro Abs: 3.4 10*3/uL (ref 1.7–7.7)
PLATELETS: 206 10*3/uL (ref 150–400)
RBC: 4.42 MIL/uL (ref 3.87–5.11)
RDW: 13.5 % (ref 11.5–15.5)
WBC: 5.7 10*3/uL (ref 4.0–10.5)

## 2013-12-17 LAB — COMPLETE METABOLIC PANEL WITH GFR
ALT: 58 U/L — AB (ref 0–35)
AST: 38 U/L — AB (ref 0–37)
Albumin: 4.3 g/dL (ref 3.5–5.2)
Alkaline Phosphatase: 113 U/L (ref 39–117)
BUN: 8 mg/dL (ref 6–23)
CALCIUM: 9.2 mg/dL (ref 8.4–10.5)
CO2: 25 meq/L (ref 19–32)
CREATININE: 0.76 mg/dL (ref 0.50–1.10)
Chloride: 104 mEq/L (ref 96–112)
GFR, Est Non African American: 80 mL/min
Glucose, Bld: 96 mg/dL (ref 70–99)
Potassium: 4.3 mEq/L (ref 3.5–5.3)
SODIUM: 137 meq/L (ref 135–145)
TOTAL PROTEIN: 6.5 g/dL (ref 6.0–8.3)
Total Bilirubin: 0.4 mg/dL (ref 0.2–1.2)

## 2013-12-17 LAB — TSH: TSH: 0.426 u[IU]/mL (ref 0.350–4.500)

## 2013-12-19 ENCOUNTER — Other Ambulatory Visit (HOSPITAL_COMMUNITY): Payer: Medicare Other

## 2014-01-02 ENCOUNTER — Ambulatory Visit (HOSPITAL_COMMUNITY)
Admission: RE | Admit: 2014-01-02 | Discharge: 2014-01-02 | Disposition: A | Discharge status: Home / Self Care | Payer: Medicare Other | Source: Ambulatory Visit | Attending: Family Medicine | Admitting: Family Medicine

## 2014-01-02 DIAGNOSIS — K760 Fatty (change of) liver, not elsewhere classified: Secondary | ICD-10-CM | POA: Diagnosis not present

## 2014-01-02 DIAGNOSIS — D1803 Hemangioma of intra-abdominal structures: Secondary | ICD-10-CM

## 2014-01-02 DIAGNOSIS — K769 Liver disease, unspecified: Secondary | ICD-10-CM | POA: Diagnosis not present

## 2014-01-02 DIAGNOSIS — R1012 Left upper quadrant pain: Secondary | ICD-10-CM

## 2014-01-02 DIAGNOSIS — K7689 Other specified diseases of liver: Secondary | ICD-10-CM | POA: Diagnosis not present

## 2014-01-02 MED ORDER — GADOBENATE DIMEGLUMINE 529 MG/ML IV SOLN
15.0000 mL | Freq: Once | INTRAVENOUS | Status: AC | PRN
  Administered 2014-01-02: 15 mL via INTRAVENOUS

## 2014-01-08 ENCOUNTER — Other Ambulatory Visit: Payer: Self-pay | Admitting: Family Medicine

## 2014-01-08 ENCOUNTER — Other Ambulatory Visit (HOSPITAL_COMMUNITY): Payer: Self-pay | Admitting: Neurosurgery

## 2014-01-08 DIAGNOSIS — D496 Neoplasm of unspecified behavior of brain: Secondary | ICD-10-CM

## 2014-01-08 DIAGNOSIS — K760 Fatty (change of) liver, not elsewhere classified: Secondary | ICD-10-CM

## 2014-01-09 ENCOUNTER — Encounter: Payer: Self-pay | Admitting: Gastroenterology

## 2014-01-16 ENCOUNTER — Other Ambulatory Visit: Payer: Self-pay | Admitting: Family Medicine

## 2014-01-16 DIAGNOSIS — H35352 Cystoid macular degeneration, left eye: Secondary | ICD-10-CM | POA: Diagnosis not present

## 2014-02-10 ENCOUNTER — Ambulatory Visit (HOSPITAL_COMMUNITY)
Admission: RE | Admit: 2014-02-10 | Discharge: 2014-02-10 | Disposition: A | Discharge status: Home / Self Care | Payer: Medicare Other | Source: Ambulatory Visit | Attending: Neurosurgery | Admitting: Neurosurgery

## 2014-02-10 DIAGNOSIS — R220 Localized swelling, mass and lump, head: Secondary | ICD-10-CM | POA: Diagnosis not present

## 2014-02-10 DIAGNOSIS — D496 Neoplasm of unspecified behavior of brain: Secondary | ICD-10-CM | POA: Diagnosis not present

## 2014-02-10 LAB — POCT I-STAT CREATININE: CREATININE: 0.8 mg/dL (ref 0.50–1.10)

## 2014-02-10 MED ORDER — GADOBENATE DIMEGLUMINE 529 MG/ML IV SOLN
15.0000 mL | Freq: Once | INTRAVENOUS | Status: AC | PRN
  Administered 2014-02-10: 15 mL via INTRAVENOUS

## 2014-02-14 ENCOUNTER — Encounter: Payer: Self-pay | Admitting: Gastroenterology

## 2014-02-14 ENCOUNTER — Ambulatory Visit (INDEPENDENT_AMBULATORY_CARE_PROVIDER_SITE_OTHER): Payer: Medicare Other | Admitting: Gastroenterology

## 2014-02-14 VITALS — BP 109/66 | HR 101 | Temp 98.6°F | Ht 66.0 in | Wt 176.4 lb

## 2014-02-14 DIAGNOSIS — K760 Fatty (change of) liver, not elsewhere classified: Secondary | ICD-10-CM

## 2014-02-14 DIAGNOSIS — R740 Nonspecific elevation of levels of transaminase and lactic acid dehydrogenase [LDH]: Secondary | ICD-10-CM | POA: Diagnosis not present

## 2014-02-14 DIAGNOSIS — D1803 Hemangioma of intra-abdominal structures: Secondary | ICD-10-CM

## 2014-02-14 DIAGNOSIS — K2100 Gastro-esophageal reflux disease with esophagitis, without bleeding: Secondary | ICD-10-CM

## 2014-02-14 DIAGNOSIS — K5900 Constipation, unspecified: Secondary | ICD-10-CM | POA: Diagnosis not present

## 2014-02-14 DIAGNOSIS — R7402 Elevation of levels of lactic acid dehydrogenase (LDH): Secondary | ICD-10-CM

## 2014-02-14 MED ORDER — LINACLOTIDE 290 MCG PO CAPS: 290.0000 ug | ORAL_CAPSULE | Freq: Every day | ORAL | Status: DC

## 2014-02-14 NOTE — Progress Notes (Signed)
Primary Care Physician:  Odette Fraction, MD  Primary Gastroenterologist:  Barney Drain, MD   Chief Complaint  Patient presents with  . Advice Only    HPI:  Shari Branch is a 68 y.o. female here for further evaluation of known liver lesions, hepatic steatosis. Seen previously by Dr. Oneida Alar, last time in 2009 at time of EGD/TCS. She has h/o large hiatal hernia, gastritis, erosive reflux esophagitis. Since that time, she was found to have liver lesions and was seen at [facility]. MRI 2010, three hypervascular liver lesions in right hepatic lobe, largest measures 3.5cm. Suspicious for malignancy at that time. She also diffuse fatty liver. She underwent CT-guided liver biopsy of the normal liver which was consistent with minimal steatohepatitis, biopsy from the liver lesion did not reveal an etiology of the patient's tumor (August 2010). She another MRI in October 2010 at [facility] which showed hepatic steatosis, multiple arterial enhancing masses, the largest measuring 1.6 cm in segment 7 with differential diagnosis including hepatic adenomatosis, multifocal HCC, atypical flash filling hemangiomas, hypervascular metastases with neuroendocrine tumors. She also had a 4 mm simple appearing cystic lesion within the tail of the pancreas, consider 6-12 month follow-up.   Patient was advised to have repeat guided liver biopsy, which was carried out in November 2010. Liver biopsy results from this attempt showed findings consistent with hemangioma of that particular lesion.  Her last follow-up at [facility] was in 2011 via MRI abdomen. She had stable 5 mm focus in the tail the pancreas, likely representing a simple cyst. Diffuse hepatic steatosis with multiple areas of focal fatty sparing. 2 stable right hepatic lobe lesions, consistent with hepatic hemangiomas. No new hepatic lesions identified.  Most recent imaging as detailed below.  Bad heartburn on pantoprazole BID. Worse at night. No dysphagia. Prilosec did not  help. nexium was not on formulary. Never tried dexilant. Some pain in rib area on LUQ. Positional. Unrelated to meals and or BM. BM chronic constipation. May go over one week without BM. Then takes laxative. No melena, brbpr.   Patient reports that she completed the hepatitis A and B vaccine series in [place].  Current Outpatient Prescriptions  Medication Sig Dispense Refill  . amitriptyline (ELAVIL) 50 MG tablet TAKE FIVE TABLETS BY MOUTH ONCE DAILY AT BEDTIME 150 tablet 5  . butalbital-acetaminophen-caffeine (FIORICET, ESGIC) 50-325-40 MG per tablet Take 1 tablet by mouth as needed for headache.    . Cholecalciferol (VITAMIN D-3) 5000 UNITS TABS Take 5,000 Units by mouth.    . clobetasol cream (TEMOVATE) 0.05 % Apply topically 2 (two) times daily.    . Cyanocobalamin (B-12) 1000 MCG CAPS Take 1,000 mg by mouth daily.    Marland Kitchen escitalopram (LEXAPRO) 20 MG tablet TAKE ONE TABLET BY MOUTH ONCE DAILY 30 tablet 6  . levothyroxine (SYNTHROID, LEVOTHROID) 112 MCG tablet Take 112 mcg by mouth daily before breakfast.    . naproxen (NAPROSYN) 500 MG tablet Take 1 tablet (500 mg total) by mouth 2 (two) times daily with a meal. 60 tablet 0  . pantoprazole (PROTONIX) 40 MG tablet TAKE ONE TABLET BY MOUTH TWICE DAILY 180 tablet 3  . Magnesium 500 MG TABS Take 500 mg by mouth daily.     No current facility-administered medications for this visit.    Allergies as of 02/14/2014 - Review Complete 02/14/2014  Allergen Reaction Noted  . Sulfonamide derivatives  01/25/2007    Past Medical History  Diagnosis Date  . Thyroid disease   . Hyperlipidemia   . 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 Liver hemangioma   . Hepatic steatosis     Past Surgical History  Procedure Laterality Date  . Tonsillectomy    . Abdominal hysterectomy    . Cholecystectomy    .  trigeminal nerve tumor      numbness on left side of face  . Ear pinna reconstruction w/ rib  graft      7 surgeries starting at age 16  . Breast reduction surgery    . [name] excision Right 09/19/2013    Procedure: EXCISION [name] FOOT;  Surgeon: Marcheta Grammes, DPM;  Location: [facility];  Service: Podiatry;  Laterality: Right;  . Ostectomy Right 09/19/2013    Procedure: OSTECTOMY;  Surgeon: Marcheta Grammes, DPM;  Location: [facility];  Service: Podiatry;  Laterality: Right;  . Esophagogastroduodenoscopy  2008    Dr. Oneida Alar: Reflux esophagitis, large hh, gastritis.   . Colonoscopy  2008    Dr. Oneida Alar: poor prep, inadequate exam  . Colonoscopy with esophagogastroduodenoscopy (egd)  2009    Dr. Oneida Alar: good prep, normal TI, normal colon, hemorrhoids, gastritis, nodule in cardia  . Cataract extraction      bilateral  . Retinal detachment surgery      bilateral    Family History  Problem Relation Age of Onset  . Colon cancer Paternal Grandmother 38  . Pancreatic cancer Father     deceased age 10  . Other      retinal detachment in multiple family member at early age    History   Social History  . Marital Status: Married    Spouse Name: N/A    Number of Children: 3  . Years of Education: N/A   Occupational History  . PT - [employer]    Social History Main Topics  . Smoking status: Current Every Day Smoker -- 1.50 packs/day  . Smokeless tobacco: Not on file  . Alcohol Use: No  . Drug Use: No  . Sexual Activity: Not on file   Other Topics Concern  . Not on file   Social History Narrative      ROS:  General: Negative for anorexia, weight loss, fever, chills, fatigue, weakness. Eyes: Negative for vision changes.  ENT: Negative for hoarseness, difficulty swallowing , nasal congestion. CV: Negative for chest pain, angina, palpitations, dyspnea on exertion, peripheral edema.  Respiratory: Negative for dyspnea at rest, dyspnea on exertion, cough, sputum, wheezing.  GI: See history of present illness. GU:  Negative for dysuria, hematuria, urinary  incontinence, urinary frequency, nocturnal urination.  MS: Negative for joint pain, low back pain.  Derm: Negative for rash or itching.  Neuro: Negative for weakness, abnormal sensation, seizure, frequent headaches, memory loss, confusion.  Psych: Negative for anxiety, depression, suicidal ideation, hallucinations.  Endo: Negative for unusual weight change.  Heme: Negative for bruising or bleeding. Allergy: Negative for rash or hives.    Physical Examination:  BP 109/66 mmHg  Pulse 101  Temp(Src) 98.6 F (37 C) (Oral)  Ht 5\' 6"  (1.676 m)  Wt 176 lb 6.4 oz (80.015 kg)  BMI 28.49 kg/m2   General: Well-nourished, well-developed in no acute distress.  Head: Normocephalic, atraumatic.   Eyes: Conjunctiva pink, no icterus. Mouth: Oropharyngeal mucosa moist and pink , no lesions erythema or exudate. Neck: Supple without thyromegaly, masses, or lymphadenopathy.  Lungs: Clear to auscultation bilaterally.  Heart: Regular rate and rhythm, no murmurs rubs or gallops.  Abdomen: Bowel sounds  are normal, nontender, nondistended, no hepatosplenomegaly or masses, no abdominal bruits or    hernia , no rebound or guarding.   Rectal: not performed Extremities: No lower extremity edema. No clubbing or deformities.  Neuro: Alert and oriented x 4 , grossly normal neurologically.  Skin: Warm and dry, no rash or jaundice.   Psych: Alert and cooperative, normal mood and affect.  Labs: Lab Results  Component Value Date   WBC 5.7 12/16/2013   HGB 13.7 12/16/2013   HCT 40.0 12/16/2013   MCV 90.5 12/16/2013   PLT 206 12/16/2013   Lab Results  Component Value Date   CREATININE 0.80 02/10/2014   BUN 8 12/16/2013   NA 137 12/16/2013   K 4.3 12/16/2013   CL 104 12/16/2013   CO2 25 12/16/2013   Lab Results  Component Value Date   ALT 58* 12/16/2013   AST 38* 12/16/2013   ALKPHOS 113 12/16/2013   BILITOT 0.4 12/16/2013   Viral markers negative in 2010.  Imaging Studies: Mr Jeri Cos IZ  Contrast  02/10/2014   CLINICAL DATA:  Follow up brain tumor.  EXAM: MRI HEAD WITHOUT AND WITH CONTRAST  TECHNIQUE: Multiplanar, multiecho pulse sequences of the brain and surrounding structures were obtained without and with intravenous contrast.  CONTRAST:  73mL MULTIHANCE GADOBENATE DIMEGLUMINE 529 MG/ML IV SOLN  COMPARISON:  08/14/2013  FINDINGS: There is no evidence of acute infarct, hemorrhage, midline shift, or extra-axial fluid collection. Cerebral volume is within normal limits for age. Multiple small foci of T2 hyperintensity throughout the subcortical and deep cerebral white matter bilaterally do not appear significantly changed and are nonspecific but compatible with mild chronic small vessel ischemic disease. There is no hydrocephalus.  Nonenhancing intraventricular mass in the body of the left lateral ventricle extending towards the foramen of [place] does not appear significantly changed in size, measuring 19 x 9 x 12 mm. Punctate focus of enhancement with associated susceptibility in the left caudate head/ anterior limb of left internal capsule is unchanged from 05/10/2013 and favored to be vascular.  Prior bilateral cataract extraction and scleral banding are noted. Paranasal sinuses are clear. Minimal left mastoid fluid is again seen. Major intracranial vascular flow voids are preserved.  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 No hydrocephalus.   Electronically Signed   By: Logan Bores   On: 02/10/2014 14:03    CLINICAL DATA: Followup indeterminate hepatic lesions.  EXAM: MRI ABDOMEN WITHOUT AND WITH CONTRAST  TECHNIQUE: Multiplanar multisequence MR imaging of the abdomen was performed both before and after the administration of intravenous contrast.  CONTRAST: 5mL MULTIHANCE GADOBENATE DIMEGLUMINE 529 MG/ML IV SOLN  COMPARISON: MRI 10/13/2008  FINDINGS: Hypervascular lesions again  demonstrated in the right hepatic lobe. The dominant lesion measures 17 x 17 mm on image 22, series 5005 not changed from 17 x 18 mm on prior remeasured. A less well-defined hypervascular lesion in the anterior margin of the right hepatic lobe measures 18 mm on image 22 also unchanged from prior. These lesions remain hyperintense on T2 weighted imaging. There is small amount of with T1 shortening surrounding the lesions. The third a subtle lesion in the central left hepatic lobe on image 22 is also noted.  Diffuse hepatic steatosis noted on the post phase imaging. The portal veins are patent. The pancreas normal. The spleen, adrenal glands, kidneys are normal.  Stomach and limited view is small bowel colon are unremarkable. No retroperitoneal periportal lymphadenopathy.  Lung bases are clear.  IMPRESSION: 1. Three hypervascular lesions in the right hepatic lobe are not changed in size from 10/13/2008. This stability over time isreassuring ; however these lesions have suspicious enhancement characteristics. Differential diagnosis would include atypical hemangioma, atypical focal nodular hyperplasia and multifocal hepatocellular carcinoma. Stability over time would not be consistent with metastasis. Recommend followup MRI with hepatocytes specific imaging agent (Eovist) in 12 months time. 2. Hepatic steatosis .   Electronically Signed  By: Suzy Bouchard M.D.  On: 01/02/2014 19:02

## 2014-02-14 NOTE — Patient Instructions (Addendum)
1. Start Linzess 257mcg daily on an empty stomach for constipation. Hold for diarrhea. First couple of days you may have frequent loose stools. That should improve.  2. Try taking your evening pantoprazole 30 minutes before your last meal of the day. If you continue to have breakthrough heartburn at night time you may add Zantac 150 mg at bedtime. 3. You will need another MRI in October 2016 to follow-up liver lesions. 4. Return to the office in 6 months to see Dr. Oneida Alar.

## 2014-02-17 ENCOUNTER — Encounter: Payer: Self-pay | Admitting: Gastroenterology

## 2014-02-17 DIAGNOSIS — K760 Fatty (change of) liver, not elsewhere classified: Secondary | ICD-10-CM | POA: Diagnosis not present

## 2014-02-17 DIAGNOSIS — D1803 Hemangioma of intra-abdominal structures: Secondary | ICD-10-CM | POA: Diagnosis not present

## 2014-02-17 DIAGNOSIS — K210 Gastro-esophageal reflux disease with esophagitis: Secondary | ICD-10-CM | POA: Diagnosis not present

## 2014-02-17 DIAGNOSIS — K5900 Constipation, unspecified: Secondary | ICD-10-CM | POA: Insufficient documentation

## 2014-02-17 DIAGNOSIS — Z6828 Body mass index (BMI) 28.0-28.9, adult: Secondary | ICD-10-CM | POA: Diagnosis not present

## 2014-02-17 DIAGNOSIS — R740 Nonspecific elevation of levels of transaminase and lactic acid dehydrogenase [LDH]: Secondary | ICD-10-CM | POA: Diagnosis not present

## 2014-02-17 DIAGNOSIS — D496 Neoplasm of unspecified behavior of brain: Secondary | ICD-10-CM | POA: Diagnosis not present

## 2014-02-17 NOTE — Assessment & Plan Note (Signed)
Add Linzess 251mcg daily. Return to office in six months. She is up to date on her colonoscopy at this time.

## 2014-02-17 NOTE — Assessment & Plan Note (Signed)
Some breakthrough symptoms at nighttime. Encouraged her to take her second dose of PPI 30 mins before her evening meal. If this does not resolve her nighttime symptoms, she may try Zantac 150 mg at bedtime. We also the option of switching her to [place] which she has never tried. She will keep [place] posted on her symptoms.

## 2014-02-17 NOTE — Assessment & Plan Note (Signed)
History multiple liver lesions dating back to 2010 as detailed above. Most recent imaging showing stability of these lesions. Previous biopsy of one of them was consistent with hemangioma at [facility]. We will arrange for MRI in October 2016 with EOVIST for follow up.   Patient also has hepatic steatosis with mildly elevated transaminases. She reports previous vaccination for hepatitis A and B in prior documentation. Discussed need to add daily exercise, weight management. Routine labs to be obtained. She will return to the office in 6 months for follow-up with Dr. Oneida Alar.

## 2014-02-17 NOTE — Progress Notes (Signed)
cc'ed to pcp °

## 2014-02-18 LAB — HEPATIC FUNCTION PANEL
ALBUMIN: 4.3 g/dL (ref 3.5–5.2)
ALT: 44 U/L — AB (ref 0–35)
AST: 32 U/L (ref 0–37)
Alkaline Phosphatase: 107 U/L (ref 39–117)
BILIRUBIN DIRECT: 0.1 mg/dL (ref 0.0–0.3)
Indirect Bilirubin: 0.2 mg/dL (ref 0.2–1.2)
TOTAL PROTEIN: 6.7 g/dL (ref 6.0–8.3)
Total Bilirubin: 0.3 mg/dL (ref 0.2–1.2)

## 2014-02-18 LAB — IRON AND TIBC
%SAT: 17 % — ABNORMAL LOW (ref 20–55)
IRON: 58 ug/dL (ref 42–145)
TIBC: 348 ug/dL (ref 250–470)
UIBC: 290 ug/dL (ref 125–400)

## 2014-02-18 LAB — HEPATITIS C ANTIBODY: HCV Ab: NEGATIVE

## 2014-02-18 LAB — MITOCHONDRIAL/SMOOTH MUSCLE AB PNL
Mitochondrial M2 Ab, IgG: 0.25 (ref <0.91)
Smooth Muscle Ab: 23 U — ABNORMAL HIGH (ref <20)

## 2014-02-18 LAB — ANA: ANA: NEGATIVE

## 2014-02-18 LAB — HEPATITIS B SURFACE ANTIGEN: HEP B S AG: NEGATIVE

## 2014-02-18 LAB — FERRITIN: Ferritin: 37 ng/mL (ref 10–291)

## 2014-02-20 LAB — CERULOPLASMIN: Ceruloplasmin: 33 mg/dL (ref 18–53)

## 2014-02-21 DIAGNOSIS — H35372 Puckering of macula, left eye: Secondary | ICD-10-CM | POA: Diagnosis not present

## 2014-02-21 IMAGING — NM NM MYOCAR SINGLE W/SPECT W/WALL MOTION & EF
2 series · 12 of 12 positions shown · non-contrast
Comparison: none

***ADDENDUM*** CREATED: 11/29/2012 [DATE]

The myocardial radioactive tracer used was Cardiolite and not
Myoview.
***END ADDENDUM*** SIGNED BY: Savio Locklear
Ordering Physician: MUKTA BARRERA
Evencio Physician: [REDACTED]al Data: The patient is a 67-year-old woman who has a history
of tobacco abuse and has been experiencing chest pain and is thus
referred for an ischemic evaluation.
NUCLEAR MEDICINE STRESS MYOVIEW STUDY WITH SPECT AND LEFT
VENTRICULAR EJECTION FRACTION
Radionuclide Data: One-day rest/stress protocol performed with
[DATE] mCi of Wc-PPm Myoview.
Stress Data: The patient was stressed according to the Bruce
protocol for 5 minutes and 30 seconds, achieving work level of 7
mets.  The resting heart rate of 84 beats per minute rose to a
maximal heart rate of 144 beats per minute.  This value represents
94% of the maximal, age predicted heart rate.  The resting blood
pressure of 86/70 mmHg rose to a maximum blood pressure of 140/82
mmHg.  The stress test was stopped due to fatigue.
EKG: The resting ECG showed normal sinus rhythm at a rate of 83
beats per minute.  With exercise there are no diagnostic ST -T wave
changes, nor any arrhythmias.
Scintigraphic Data: Tomographic views were obtained using the short
axis, vertical long
axis, and horizontal long axis planes.
With stress there was normal homogeneous uptake of the myocardial
radioactive tracer in all wall segments with no changes seen on the
resting images.  Left ventricular systolic function was normal,
with a calculated LVEF of 74%.
There is no evidence of ischemia or scar.

[cardiac rest stress · 6.39mm/px · 6 of 64 frames shown (1 of 2)]
[frame 6/64]
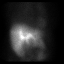
[frame 16/64]
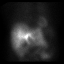
[frame 27/64]
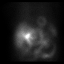
[frame 38/64]
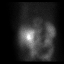
[frame 48/64]
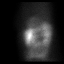
[frame 59/64]
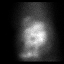

[cardiac rest stress · 6.39mm/px · 6 of 512 frames shown (2 of 2)]
[frame 43/512]
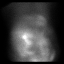
[frame 128/512]
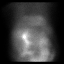
[frame 214/512]
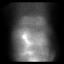
[frame 299/512]
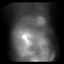
[frame 384/512]
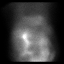
[frame 470/512]
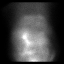

[12 of 12 positions shown; findings below may reference images not displayed]

IMPRESSION: 1. Normal nuclear myocardial perfusion study.

2. No evidence of ischemia or scar.

3. Normal LV systolic function, EF 74%.

## 2014-03-14 NOTE — Progress Notes (Signed)
Quick Note:  Smooth muscle Ab weakly positive, likely insignifiant. LFTs improved.  Instructions for fatty liver: Recommend 1-2# weight loss per week until ideal body weight through exercise & diet. Low fat/cholesterol diet.  Avoid sweets, sodas, fruit juices, sweetened beverages like tea, etc. Gradually increase exercise from 15 min daily up to 1 hr per day 5 days/week. Limit alcohol use.  Repeat LFTs, smooth muscle Ab in 3 months.    ______

## 2014-03-17 ENCOUNTER — Other Ambulatory Visit: Payer: Self-pay

## 2014-03-17 ENCOUNTER — Telehealth: Payer: Self-pay

## 2014-03-17 DIAGNOSIS — R7989 Other specified abnormal findings of blood chemistry: Secondary | ICD-10-CM

## 2014-03-17 DIAGNOSIS — R945 Abnormal results of liver function studies: Secondary | ICD-10-CM

## 2014-03-17 DIAGNOSIS — D1803 Hemangioma of intra-abdominal structures: Secondary | ICD-10-CM

## 2014-03-17 NOTE — Telephone Encounter (Signed)
See result note. Pt aware.  

## 2014-03-17 NOTE — Progress Notes (Signed)
Quick Note:  LM for pt to return call. ______ 

## 2014-03-17 NOTE — Progress Notes (Signed)
Quick Note:  Pt is aware and lab orders on file for 06/2014. ______

## 2014-03-17 NOTE — Telephone Encounter (Signed)
PATIENT RETURNED YOUR CALL, PLEASE CALL BACK

## 2014-04-30 ENCOUNTER — Other Ambulatory Visit: Payer: Self-pay | Admitting: Family Medicine

## 2014-05-26 DIAGNOSIS — H35373 Puckering of macula, bilateral: Secondary | ICD-10-CM | POA: Diagnosis not present

## 2014-05-28 ENCOUNTER — Other Ambulatory Visit: Payer: Self-pay

## 2014-05-28 DIAGNOSIS — R7989 Other specified abnormal findings of blood chemistry: Secondary | ICD-10-CM

## 2014-05-28 DIAGNOSIS — D1803 Hemangioma of intra-abdominal structures: Secondary | ICD-10-CM

## 2014-05-28 DIAGNOSIS — R945 Abnormal results of liver function studies: Secondary | ICD-10-CM

## 2014-06-16 DIAGNOSIS — Z1231 Encounter for screening mammogram for malignant neoplasm of breast: Secondary | ICD-10-CM | POA: Diagnosis not present

## 2014-06-16 DIAGNOSIS — R7989 Other specified abnormal findings of blood chemistry: Secondary | ICD-10-CM | POA: Diagnosis not present

## 2014-06-16 DIAGNOSIS — R799 Abnormal finding of blood chemistry, unspecified: Secondary | ICD-10-CM | POA: Diagnosis not present

## 2014-06-16 DIAGNOSIS — D1803 Hemangioma of intra-abdominal structures: Secondary | ICD-10-CM | POA: Diagnosis not present

## 2014-06-16 LAB — HM MAMMOGRAPHY: HM MAMMO: NEGATIVE

## 2014-06-17 LAB — HEPATIC FUNCTION PANEL
ALT: 55 U/L — ABNORMAL HIGH (ref 0–35)
AST: 50 U/L — ABNORMAL HIGH (ref 0–37)
Albumin: 4.1 g/dL (ref 3.5–5.2)
Alkaline Phosphatase: 112 U/L (ref 39–117)
BILIRUBIN DIRECT: 0.1 mg/dL (ref 0.0–0.3)
BILIRUBIN INDIRECT: 0.3 mg/dL (ref 0.2–1.2)
BILIRUBIN TOTAL: 0.4 mg/dL (ref 0.2–1.2)
Total Protein: 6.4 g/dL (ref 6.0–8.3)

## 2014-06-17 LAB — ANTI-SMOOTH MUSCLE ANTIBODY, IGG: Smooth Muscle Ab: 9 U (ref <20)

## 2014-06-20 ENCOUNTER — Encounter: Payer: Self-pay | Admitting: *Deleted

## 2014-06-24 ENCOUNTER — Encounter: Payer: Self-pay | Admitting: Family Medicine

## 2014-06-24 ENCOUNTER — Ambulatory Visit (INDEPENDENT_AMBULATORY_CARE_PROVIDER_SITE_OTHER): Payer: Medicare Other | Admitting: Family Medicine

## 2014-06-24 VITALS — BP 124/64 | HR 88 | Temp 98.0°F | Resp 16 | Ht 68.0 in | Wt 181.0 lb

## 2014-06-24 DIAGNOSIS — R5383 Other fatigue: Secondary | ICD-10-CM | POA: Diagnosis not present

## 2014-06-24 LAB — COMPLETE METABOLIC PANEL WITH GFR
ALBUMIN: 4.1 g/dL (ref 3.5–5.2)
ALK PHOS: 123 U/L — AB (ref 39–117)
ALT: 64 U/L — ABNORMAL HIGH (ref 0–35)
AST: 56 U/L — ABNORMAL HIGH (ref 0–37)
BUN: 7 mg/dL (ref 6–23)
CALCIUM: 9.1 mg/dL (ref 8.4–10.5)
CO2: 26 mEq/L (ref 19–32)
Chloride: 102 mEq/L (ref 96–112)
Creat: 0.75 mg/dL (ref 0.50–1.10)
GFR, Est Non African American: 82 mL/min
Glucose, Bld: 98 mg/dL (ref 70–99)
POTASSIUM: 4 meq/L (ref 3.5–5.3)
Sodium: 136 mEq/L (ref 135–145)
Total Bilirubin: 0.4 mg/dL (ref 0.2–1.2)
Total Protein: 6.5 g/dL (ref 6.0–8.3)

## 2014-06-24 LAB — CBC WITH DIFFERENTIAL/PLATELET
Basophils Absolute: 0.1 10*3/uL (ref 0.0–0.1)
Basophils Relative: 1 % (ref 0–1)
EOS ABS: 0.2 10*3/uL (ref 0.0–0.7)
EOS PCT: 3 % (ref 0–5)
HCT: 40.1 % (ref 36.0–46.0)
HEMOGLOBIN: 13.6 g/dL (ref 12.0–15.0)
LYMPHS ABS: 1.6 10*3/uL (ref 0.7–4.0)
Lymphocytes Relative: 29 % (ref 12–46)
MCH: 30.8 pg (ref 26.0–34.0)
MCHC: 33.9 g/dL (ref 30.0–36.0)
MCV: 90.9 fL (ref 78.0–100.0)
MONO ABS: 0.3 10*3/uL (ref 0.1–1.0)
MONOS PCT: 6 % (ref 3–12)
MPV: 9.9 fL (ref 8.6–12.4)
Neutro Abs: 3.4 10*3/uL (ref 1.7–7.7)
Neutrophils Relative %: 61 % (ref 43–77)
Platelets: 197 10*3/uL (ref 150–400)
RBC: 4.41 MIL/uL (ref 3.87–5.11)
RDW: 13.6 % (ref 11.5–15.5)
WBC: 5.6 10*3/uL (ref 4.0–10.5)

## 2014-06-24 LAB — TSH: TSH: 0.602 u[IU]/mL (ref 0.350–4.500)

## 2014-06-24 LAB — SEDIMENTATION RATE: Sed Rate: 8 mm/hr (ref 0–30)

## 2014-06-24 NOTE — Progress Notes (Signed)
Subjective:    Patient ID: Shari Branch, female    DOB: [birth year], 69 y.o.   MRN: [id number]  HPI Patient presents with 3 weeks of worsening fatigue, feeling tired all the time, trouble concentrating, memory problems.  Around the same time she was dealing with a tremendous amount of stress in her family. Her mother was transferred into a nursing home for Alzheimer's disease. She got into several conflicts with her sister and her daughter regarding personal issues. Patient does admit that she is under tremendous stress recently in dealing with more anxiety. She denies any fevers or chills. She denies any bleeding or bruising. She denies any weight loss. She denies any chest pain shortness of breath or dyspnea on exertion. She denies any melena or hematochezia. She denies any symptoms of infection. Past Medical History  Diagnosis Date  . Thyroid disease   . Hyperlipidemia   . Trigeminal neuralgia 1997    Trigemninal Tumor- Left, [place]  . Liver hemangioma   . Hepatic steatosis    Past Surgical History  Procedure Laterality Date  . Tonsillectomy    . Abdominal hysterectomy    . Cholecystectomy    .  trigeminal nerve tumor      numbness on left side of face  . Ear pinna reconstruction w/ rib graft      7 surgeries starting at age 66  . Breast reduction surgery    . [name] excision Right 09/19/2013    Procedure: EXCISION [name] FOOT;  Surgeon: Marcheta Grammes, DPM;  Location: [facility];  Service: Podiatry;  Laterality: Right;  . Ostectomy Right 09/19/2013    Procedure: OSTECTOMY;  Surgeon: Marcheta Grammes, DPM;  Location: [facility];  Service: Podiatry;  Laterality: Right;  . Esophagogastroduodenoscopy  2008    Dr. Oneida Alar: Reflux esophagitis, large hh, gastritis.   . Colonoscopy  2008    Dr. Oneida Alar: poor prep, inadequate exam  . Colonoscopy with esophagogastroduodenoscopy (egd)  2009    Dr. Oneida Alar: good prep, normal TI, normal colon,  hemorrhoids, gastritis, nodule in cardia (benign)  . Cataract extraction      bilateral  . Retinal detachment surgery      bilateral   Current Outpatient Prescriptions on File Prior to Visit  Medication Sig Dispense Refill  . amitriptyline (ELAVIL) 50 MG tablet TAKE FIVE TABLETS BY MOUTH ONCE DAILY AT BEDTIME 150 tablet 5  . butalbital-acetaminophen-caffeine (FIORICET, ESGIC) 50-325-40 MG per tablet Take 1 tablet by mouth as needed for headache.    . Cholecalciferol (VITAMIN D-3) 5000 UNITS TABS Take 5,000 Units by mouth.    . clobetasol cream (TEMOVATE) 0.05 % Apply topically 2 (two) times daily.    . Cyanocobalamin (B-12) 1000 MCG CAPS Take 1,000 mg by mouth daily.    Marland Kitchen escitalopram (LEXAPRO) 20 MG tablet TAKE ONE TABLET BY MOUTH ONCE DAILY 30 tablet 5  . levothyroxine (SYNTHROID, LEVOTHROID) 112 MCG tablet Take 112 mcg by mouth daily before breakfast.    . Linaclotide (LINZESS) 290 MCG CAPS capsule Take 1 capsule (290 mcg total) by mouth daily. 90 capsule 3  . Magnesium 500 MG TABS Take 500 mg by mouth daily.    . naproxen (NAPROSYN) 500 MG tablet Take 1 tablet (500 mg total) by mouth 2 (two) times daily with a meal. 60 tablet 0  . pantoprazole (PROTONIX) 40 MG tablet TAKE ONE TABLET BY MOUTH TWICE DAILY 180 tablet 3   No current facility-administered medications on file prior to visit.  Allergies  Allergen Reactions  . Sulfonamide Derivatives     REACTION: Joint Pain and locking - walks like a 69 year old   History   Social History  . Marital Status: Married    Spouse Name: N/A  . Number of Children: 3  . Years of Education: N/A   Occupational History  . PT - [employer]    Social History Main Topics  . Smoking status: Current Every Day Smoker -- 1.50 packs/day  . Smokeless tobacco: Not on file  . Alcohol Use: No  . Drug Use: No  . Sexual Activity: Not on file   Other Topics Concern  . Not on file   Social History Narrative      Review of Systems  All other  systems reviewed and are negative.      Objective:   Physical Exam  Constitutional: She appears well-developed and well-nourished. No distress.  HENT:  Mouth/Throat: Oropharynx is clear and moist.  Eyes: Conjunctivae are normal. No scleral icterus.  Neck: Neck supple. No JVD present. No thyromegaly present.  Cardiovascular: Normal rate, regular rhythm and normal heart sounds.   No murmur heard. Pulmonary/Chest: Effort normal and breath sounds normal. No respiratory distress. She has no wheezes. She has no rales.  Abdominal: Soft. Bowel sounds are normal. She exhibits no distension. There is no tenderness. There is no rebound and no guarding.  Musculoskeletal: She exhibits no edema.  Lymphadenopathy:    She has no cervical adenopathy.  Skin: She is not diaphoretic.  Vitals reviewed.         Assessment & Plan:  Other fatigue - Plan: COMPLETE METABOLIC PANEL WITH GFR, CBC with Differential/Platelet, TSH, Sedimentation rate, Fecal occult blood, imunochemical, Fecal occult blood, imunochemical, Fecal occult blood, imunochemical  I explained to the patient that the differential diagnoses for fatigue is wide and varied.  I do not believe it is coincidental that her symptoms began exactly the same time she started experiencing this tremendous stress with her family and her personal life. I believe this is most likely cause of her symptoms. However I will begin a workup for other potential causes of fatigue including a CBC, CMP, sedimentation rate, TSH, and fecal occult blood cards 3. Recheck next week after the results of the blood work.

## 2014-06-25 ENCOUNTER — Other Ambulatory Visit: Payer: Self-pay

## 2014-06-25 DIAGNOSIS — R7401 Elevation of levels of liver transaminase levels: Secondary | ICD-10-CM

## 2014-06-25 DIAGNOSIS — R740 Nonspecific elevation of levels of transaminase and lactic acid dehydrogenase [LDH]: Principal | ICD-10-CM

## 2014-06-25 NOTE — Progress Notes (Signed)
Quick Note:  Minimal elevated of AST/ALT. SMooth muscle Ab now normal! Suspect fatty liver.   Instructions for fatty liver: Recommend 1-2# weight loss per week until ideal body weight through exercise & diet. Low fat/cholesterol diet.  Avoid sweets, sodas, fruit juices, sweetened beverages like tea, etc. Gradually increase exercise from 15 min daily up to 1 hr per day 5 days/week. Limit alcohol use.  Keep OV upcoming in the next couple of months.  NIC for repeat LFTs in 3 months. ______

## 2014-06-26 NOTE — Progress Notes (Signed)
Quick Note:  Pt is aware and info mailed to her at her request. LFT order on file for 09/24/2014. ______

## 2014-06-27 ENCOUNTER — Other Ambulatory Visit: Payer: Self-pay | Admitting: Family Medicine

## 2014-06-27 NOTE — Telephone Encounter (Signed)
Medication refilled per protocol. 

## 2014-07-02 ENCOUNTER — Other Ambulatory Visit: Payer: Self-pay | Admitting: Family Medicine

## 2014-07-02 DIAGNOSIS — R7989 Other specified abnormal findings of blood chemistry: Secondary | ICD-10-CM

## 2014-07-02 DIAGNOSIS — R945 Abnormal results of liver function studies: Principal | ICD-10-CM

## 2014-07-08 ENCOUNTER — Ambulatory Visit (HOSPITAL_COMMUNITY)
Admission: RE | Admit: 2014-07-08 | Discharge: 2014-07-08 | Disposition: A | Discharge status: Home / Self Care | Payer: Medicare Other | Source: Ambulatory Visit | Attending: Family Medicine | Admitting: Family Medicine

## 2014-07-08 DIAGNOSIS — Z9049 Acquired absence of other specified parts of digestive tract: Secondary | ICD-10-CM | POA: Diagnosis not present

## 2014-07-08 DIAGNOSIS — K769 Liver disease, unspecified: Secondary | ICD-10-CM | POA: Diagnosis not present

## 2014-07-08 DIAGNOSIS — R945 Abnormal results of liver function studies: Secondary | ICD-10-CM | POA: Diagnosis not present

## 2014-07-08 DIAGNOSIS — K760 Fatty (change of) liver, not elsewhere classified: Secondary | ICD-10-CM | POA: Diagnosis not present

## 2014-07-08 DIAGNOSIS — R7989 Other specified abnormal findings of blood chemistry: Secondary | ICD-10-CM

## 2014-07-17 ENCOUNTER — Encounter: Payer: Self-pay | Admitting: Gastroenterology

## 2014-07-21 ENCOUNTER — Other Ambulatory Visit (HOSPITAL_COMMUNITY): Payer: Self-pay | Admitting: Neurosurgery

## 2014-07-21 ENCOUNTER — Other Ambulatory Visit: Payer: Self-pay | Admitting: Family Medicine

## 2014-07-21 ENCOUNTER — Telehealth: Payer: Self-pay | Admitting: Family Medicine

## 2014-07-21 DIAGNOSIS — B009 Herpesviral infection, unspecified: Secondary | ICD-10-CM

## 2014-07-21 DIAGNOSIS — D496 Neoplasm of unspecified behavior of brain: Secondary | ICD-10-CM

## 2014-07-21 NOTE — Telephone Encounter (Signed)
Pt states that you and her had talked about her concentration and memory problems in LOV and she went to the dentist and had 3 abscessed teeth and that, that was what was causing her problems and just wanted you to know.

## 2014-07-30 ENCOUNTER — Ambulatory Visit (HOSPITAL_COMMUNITY)
Admission: RE | Admit: 2014-07-30 | Discharge: 2014-07-30 | Disposition: A | Discharge status: Home / Self Care | Payer: Medicare Other | Source: Ambulatory Visit | Attending: Neurosurgery | Admitting: Neurosurgery

## 2014-07-30 DIAGNOSIS — D496 Neoplasm of unspecified behavior of brain: Secondary | ICD-10-CM | POA: Diagnosis not present

## 2014-07-30 DIAGNOSIS — G9389 Other specified disorders of brain: Secondary | ICD-10-CM | POA: Diagnosis not present

## 2014-07-30 MED ORDER — GADOBENATE DIMEGLUMINE 529 MG/ML IV SOLN
16.0000 mL | Freq: Once | INTRAVENOUS | Status: AC | PRN
  Administered 2014-07-30: 16 mL via INTRAVENOUS

## 2014-08-05 ENCOUNTER — Ambulatory Visit (HOSPITAL_COMMUNITY): Admission: RE | Admit: 2014-08-05 | Payer: Medicare Other | Source: Ambulatory Visit

## 2014-08-07 DIAGNOSIS — D496 Neoplasm of unspecified behavior of brain: Secondary | ICD-10-CM | POA: Diagnosis not present

## 2014-08-07 DIAGNOSIS — Z6828 Body mass index (BMI) 28.0-28.9, adult: Secondary | ICD-10-CM | POA: Diagnosis not present

## 2014-08-22 ENCOUNTER — Other Ambulatory Visit: Payer: Self-pay

## 2014-08-22 DIAGNOSIS — R7401 Elevation of levels of liver transaminase levels: Secondary | ICD-10-CM

## 2014-08-22 DIAGNOSIS — R740 Nonspecific elevation of levels of transaminase and lactic acid dehydrogenase [LDH]: Principal | ICD-10-CM

## 2014-09-15 DIAGNOSIS — L439 Lichen planus, unspecified: Secondary | ICD-10-CM | POA: Diagnosis not present

## 2014-09-15 DIAGNOSIS — L438 Other lichen planus: Secondary | ICD-10-CM | POA: Diagnosis not present

## 2014-09-24 DIAGNOSIS — R740 Nonspecific elevation of levels of transaminase and lactic acid dehydrogenase [LDH]: Secondary | ICD-10-CM | POA: Diagnosis not present

## 2014-09-25 LAB — HEPATIC FUNCTION PANEL
ALT: 64 U/L — ABNORMAL HIGH (ref 0–35)
AST: 47 U/L — ABNORMAL HIGH (ref 0–37)
Albumin: 3.9 g/dL (ref 3.5–5.2)
Alkaline Phosphatase: 110 U/L (ref 39–117)
BILIRUBIN INDIRECT: 0.2 mg/dL (ref 0.2–1.2)
Bilirubin, Direct: 0.1 mg/dL (ref 0.0–0.3)
TOTAL PROTEIN: 6.7 g/dL (ref 6.0–8.3)
Total Bilirubin: 0.3 mg/dL (ref 0.2–1.2)

## 2014-09-25 NOTE — Progress Notes (Signed)
Quick Note:  AST/ALT stable. She was supposed to have OV with [facility] in 08/2014 but patient never made appointment.  She is on recall for MRI in 12/2014. Let's go ahead and get nonurgent OV with [facility] for liver lesions and abnormal LFTs. ______

## 2014-09-29 DIAGNOSIS — L438 Other lichen planus: Secondary | ICD-10-CM | POA: Diagnosis not present

## 2014-09-29 NOTE — Progress Notes (Signed)
Quick Note:  LM for pt to call. ______ 

## 2014-09-30 ENCOUNTER — Encounter: Payer: Self-pay | Admitting: Gastroenterology

## 2014-09-30 ENCOUNTER — Telehealth: Payer: Self-pay

## 2014-09-30 NOTE — Telephone Encounter (Signed)
Pt called and was given her lab results.  She needs non urgent OV with Dr. Oneida Alar. Routing to [place] to schedule. ( Please see note of 09/24/2014).

## 2014-09-30 NOTE — Progress Notes (Signed)
Quick Note:  Pt is aware and appt has been made. ______

## 2014-09-30 NOTE — Telephone Encounter (Signed)
APPOINTMENT MADE AND LETTER SENT °

## 2014-10-03 DIAGNOSIS — H35373 Puckering of macula, bilateral: Secondary | ICD-10-CM | POA: Diagnosis not present

## 2014-10-31 ENCOUNTER — Other Ambulatory Visit: Payer: Self-pay | Admitting: Family Medicine

## 2014-11-11 ENCOUNTER — Other Ambulatory Visit: Payer: Self-pay | Admitting: Family Medicine

## 2014-11-12 ENCOUNTER — Ambulatory Visit (INDEPENDENT_AMBULATORY_CARE_PROVIDER_SITE_OTHER): Payer: Medicare Other | Admitting: Gastroenterology

## 2014-11-12 ENCOUNTER — Encounter: Payer: Self-pay | Admitting: Gastroenterology

## 2014-11-12 VITALS — BP 126/77 | HR 96 | Temp 97.8°F | Ht 67.0 in | Wt 181.4 lb

## 2014-11-12 DIAGNOSIS — K5901 Slow transit constipation: Secondary | ICD-10-CM | POA: Diagnosis not present

## 2014-11-12 DIAGNOSIS — D1803 Hemangioma of intra-abdominal structures: Secondary | ICD-10-CM

## 2014-11-12 DIAGNOSIS — K7581 Nonalcoholic steatohepatitis (NASH): Secondary | ICD-10-CM | POA: Diagnosis not present

## 2014-11-12 NOTE — Patient Instructions (Addendum)
COMPLETE MRI IN OCT 2016.  DRINK WATER TO KEEP YOUR URINE LIGHT YELLOW.  FOLLOW A LOW FAT DIET. MEATS SHOULD BE BAKED, BROILED, OR BOILED. SEE INFO BELOW.  LOSE  10-15 POUNDS.  CONTINUE PROBIOTIC TWICE DAILY.  FOLLOW UP IN 6 MOS.   Low-Fat Diet BREADS, CEREALS, PASTA, RICE, DRIED PEAS, AND BEANS These products are high in carbohydrates and most are low in fat. Therefore, they can be increased in the diet as substitutes for fatty foods. They too, however, contain calories and should not be eaten in excess. Cereals can be eaten for snacks as well as for breakfast.   FRUITS AND VEGETABLES It is good to eat fruits and vegetables. Besides being sources of fiber, both are rich in vitamins and some minerals. They help you get the daily allowances of these nutrients. Fruits and vegetables can be used for snacks and desserts.  MEATS Limit lean meat, chicken, [place], and fish to no more than 6 ounces per day. Beef, Pork, and Lamb Use lean cuts of beef, pork, and lamb. Lean cuts include:  Extra-lean ground beef.  Arm roast.  Sirloin tip.  Center-cut ham.  Round steak.  Loin chops.  Rump roast.  Tenderloin.  Trim all fat off the outside of meats before cooking. It is not necessary to severely decrease the intake of red meat, but lean choices should be made. Lean meat is rich in protein and contains a highly absorbable form of iron. Premenopausal women, in particular, should avoid reducing lean red meat because this could increase the risk for low red blood cells (iron-deficiency anemia).  Chicken and [place] These are good sources of protein. The fat of poultry can be reduced by removing the skin and underlying fat layers before cooking. Chicken and [place] can be substituted for lean red meat in the diet. Poultry should not be fried or covered with high-fat sauces. Fish and Shellfish Fish is a good source of protein. Shellfish contain cholesterol, but they usually are low in saturated fatty  acids. The preparation of fish is important. Like chicken and [place], they should not be fried or covered with high-fat sauces. EGGS Egg whites contain no fat or cholesterol. They can be eaten often. Try 1 to 2 egg whites instead of whole eggs in recipes or use egg substitutes that do not contain yolk. MILK AND DAIRY PRODUCTS Use skim or 1% milk instead of 2% or whole milk. Decrease whole milk, natural, and processed cheeses. Use nonfat or low-fat (2%) cottage cheese or low-fat cheeses made from vegetable oils. Choose nonfat or low-fat (1 to 2%) yogurt. Experiment with evaporated skim milk in recipes that call for heavy cream. Substitute low-fat yogurt or low-fat cottage cheese for sour cream in dips and salad dressings. Have at least 2 servings of low-fat dairy products, such as 2 glasses of skim (or 1%) milk each day to help get your daily calcium intake. FATS AND OILS Reduce the total intake of fats, especially saturated fat. Butterfat, lard, and beef fats are high in saturated fat and cholesterol. These should be avoided as much as possible. Vegetable fats do not contain cholesterol, but certain vegetable fats, such as coconut oil, palm oil, and palm kernel oil are very high in saturated fats. These should be limited. These fats are often used in bakery goods, processed foods, popcorn, oils, and nondairy creamers. Vegetable shortenings and some peanut butters contain hydrogenated oils, which are also saturated fats. Read the labels on these foods and check for saturated  vegetable oils. Unsaturated vegetable oils and fats do not raise blood cholesterol. However, they should be limited because they are fats and are high in calories. Total fat should still be limited to 30% of your daily caloric intake. Desirable liquid vegetable oils are corn oil, cottonseed oil, olive oil, canola oil, safflower oil, soybean oil, and sunflower oil. Peanut oil is not as good, but small amounts are acceptable. Buy a  heart-healthy tub margarine that has no partially hydrogenated oils in the ingredients. Mayonnaise and salad dressings often are made from unsaturated fats, but they should also be limited because of their high calorie and fat content. Seeds, nuts, peanut butter, olives, and avocados are high in fat, but the fat is mainly the unsaturated type. These foods should be limited mainly to avoid excess calories and fat. OTHER EATING TIPS Snacks  Most sweets should be limited as snacks. They tend to be rich in calories and fats, and their caloric content outweighs their nutritional value. Some good choices in snacks are graham crackers, melba toast, soda crackers, bagels (no egg), English muffins, fruits, and vegetables. These snacks are preferable to snack crackers, [place] fries, TORTILLA CHIPS, and POTATO chips. Popcorn should be air-popped or cooked in small amounts of liquid vegetable oil. Desserts Eat fruit, low-fat yogurt, and fruit ices instead of pastries, cake, and cookies. Sherbet, angel food cake, gelatin dessert, frozen low-fat yogurt, or other frozen products that do not contain saturated fat (pure fruit juice bars, frozen ice pops) are also acceptable.  COOKING METHODS Choose those methods that use little or no fat. They include: Poaching.  Braising.  Steaming.  Grilling.  Baking.  Stir-frying.  Broiling.  Microwaving.  Foods can be cooked in a nonstick pan without added fat, or use a nonfat cooking spray in regular cookware. Limit fried foods and avoid frying in saturated fat. Add moisture to lean meats by using water, broth, cooking wines, and other nonfat or low-fat sauces along with the cooking methods mentioned above. Soups and stews should be chilled after cooking. The fat that forms on top after a few hours in the refrigerator should be skimmed off. When preparing meals, avoid using excess salt. Salt can contribute to raising blood pressure in some people.  EATING AWAY FROM  HOME Order entres, potatoes, and vegetables without sauces or butter. When meat exceeds the size of a deck of cards (3 to 4 ounces), the rest can be taken home for another meal. Choose vegetable or fruit salads and ask for low-calorie salad dressings to be served on the side. Use dressings sparingly. Limit high-fat toppings, such as bacon, crumbled eggs, cheese, sunflower seeds, and olives. Ask for heart-healthy tub margarine instead of butter.

## 2014-11-12 NOTE — Progress Notes (Signed)
cc'ed to pcp °

## 2014-11-12 NOTE — Progress Notes (Signed)
REVIEWED-NO ADDITIONAL RECOMMENDATIONS. 

## 2014-11-12 NOTE — Progress Notes (Signed)
ON RECALL  °

## 2014-11-12 NOTE — Assessment & Plan Note (Signed)
SYMPTOMS CONTROLLED/RESOLVED WITH DAILY PROBIOTIC. UNABLE TO TOLERATE LINZESS.  CONTINUE PROBIOTIC DAILY. FOLLOW UP IN 6 MOS.

## 2014-11-12 NOTE — Progress Notes (Signed)
Subjective:    Patient ID: Shari Branch, female    DOB: 1945/10/08, 69 y.o.   MRN: [id number]  [facility] TOM, MD  HPI Pt LAST SEEN DEC 2015: 176 LBS. No questions or concerns. LINZESS MADE HER EXTREMELY NAUSEATED. SHE STOPPED IT AFTER 30 DAYS. TAKING PROBIOTIC(SUNDOWN0 GUMMIES 2X/DAY. BOWELS REGULAR(#4). HEARTBURN CONTROLLED ON PPI. CUT BACK ON ELAVL FROM 5 TO 3 DUE TO DIZZINESS. MAY HAVE DROOLING IN LEFT SIDE OF FACE. NOT WATCHING DIET CURRENTLY BECAUSE HUSBAND IS GOING THROUGH TREATMENTS FOR PROSTATE CANCER.  PT DENIES FEVER, CHILLS, HEMATOCHEZIA, HEMATEMESIS, nausea, vomiting, melena, diarrhea, CHEST PAIN, SHORTNESS OF BREATH,  CHANGE IN BOWEL IN HABITS, constipation, abdominal pain, problems swallowing, OR heartburn or indigestion.  Past Medical History  Diagnosis Date  . Thyroid disease   . Hyperlipidemia   . Trigeminal neuralgia 1997    Trigemninal Tumor- Left, [place]  . Liver hemangioma   . Hepatic steatosis    Past Surgical History  Procedure Laterality Date  . Tonsillectomy    . Abdominal hysterectomy    . Cholecystectomy    .  trigeminal nerve tumor      numbness on left side of face  . Ear pinna reconstruction w/ rib graft      7 surgeries starting at age 38  . Breast reduction surgery    . [name] excision Right 09/19/2013    Procedure: EXCISION [name] FOOT;  Surgeon: Marcheta Grammes, DPM;  Location: [facility];  Service: Podiatry;  Laterality: Right;  . Ostectomy Right 09/19/2013    Procedure: OSTECTOMY;  Surgeon: Marcheta Grammes, DPM;  Location: [facility];  Service: Podiatry;  Laterality: Right;  . Esophagogastroduodenoscopy  2008    Dr. Oneida Alar: Reflux esophagitis, large hh, gastritis.   . Colonoscopy  2008    Dr. Oneida Alar: poor prep, inadequate exam  . Colonoscopy with esophagogastroduodenoscopy (egd)  2009    Dr. Oneida Alar: good prep, normal TI, normal colon, hemorrhoids, gastritis, nodule in cardia (benign)  .  Cataract extraction      bilateral  . Retinal detachment surgery      bilateral   Allergies  Allergen Reactions  . Sulfonamide Derivatives     REACTION: Joint Pain and locking - walks like a 69 year old    Current Outpatient Prescriptions  Medication Sig Dispense Refill  . amitriptyline (ELAVIL) 50 MG tablet TAKE THREE TABLETS BY MOUTH ONCE DAILY AT BEDTIME    . butalbital-acetaminophen-caffeine (FIORICET, ESGIC) 50-325-40 MG per tablet Take 1 tablet by mouth as needed for headache.    . Cholecalciferol (VITAMIN D-3) 5000 UNITS TABS Take 5,000 Units by mouth.    . clobetasol cream (TEMOVATE) 0.05 % Apply topically 2 (two) times daily.    . Cyanocobalamin (B-12) 1000 MCG CAPS Take 1,000 mg by mouth daily.    Marland Kitchen escitalopram (LEXAPRO) 20 MG tablet TAKE ONE TABLET BY MOUTH ONCE DAILY    . levothyroxine (SYNTHROID, LEVOTHROID) 112 MCG tablet TAKE ONE TABLET BY MOUTH ONCE DAILY    .      Marland Kitchen Magnesium 500 MG TABS Take 500 mg by mouth daily.    . naproxen (NAPROSYN) 500 MG tablet Take 1 tablet (500 mg total) by mouth 2 (two) times daily with a meal.    . pantoprazole (PROTONIX) 40 MG tablet TAKE ONE TABLET BY MOUTH TWICE DAILY     Review of Systems PER HPI OTHERWISE ALL SYSTEMS ARE NEGATIVE.    Objective:   Physical Exam  Constitutional: She  is oriented to person, place, and time. She appears well-developed and well-nourished. No distress.  HENT:  Head: Normocephalic and atraumatic.  Mouth/Throat: Oropharynx is clear and moist. No oropharyngeal exudate.  Eyes: Pupils are equal, round, and reactive to light. No scleral icterus.  Neck: Normal range of motion. Neck supple.  Cardiovascular: Normal rate, regular rhythm and normal heart sounds.   Pulmonary/Chest: Effort normal and breath sounds normal. No respiratory distress.  Abdominal: Soft. Bowel sounds are normal. She exhibits no distension. There is no tenderness.  Musculoskeletal: She exhibits no edema.  Lymphadenopathy:    She has  no cervical adenopathy.  Neurological: She is alert and oriented to person, place, and time.  NO  NEW FOCAL DEFICITS   Psychiatric: She has a normal mood and affect.  Vitals reviewed.         Assessment & Plan:

## 2014-11-12 NOTE — Assessment & Plan Note (Addendum)
NO SIGN OR SYMPTOMS OF CIRRHOSIS. WEIGHT UP 5 LBS SINCE DEC 2015 IN SETTING OF SPOUSE BEING ILL.  ENCOURAGED 10-15 LBS WEIGHT LOSS. DISCUSSED BENEFITS OF WEIGHT LOSS, AND RISKS OF CIRRHOSIS WITH CONTINUED WEIGHT GAIN IN THE SETTING OF ELEVATED LIVER ENZYMES. LOW FAT DIET FOLLOW UP IN 6 MOS.

## 2014-11-12 NOTE — Assessment & Plan Note (Signed)
NEEDS MRI OF LIVER OCT 2016 WITH [facility].

## 2014-11-27 ENCOUNTER — Other Ambulatory Visit: Payer: Self-pay | Admitting: Family Medicine

## 2014-12-17 ENCOUNTER — Ambulatory Visit (HOSPITAL_COMMUNITY)
Admission: RE | Admit: 2014-12-17 | Discharge: 2014-12-17 | Disposition: A | Discharge status: Home / Self Care | Payer: Medicare Other | Source: Ambulatory Visit | Attending: Family Medicine | Admitting: Family Medicine

## 2014-12-17 DIAGNOSIS — K760 Fatty (change of) liver, not elsewhere classified: Secondary | ICD-10-CM | POA: Diagnosis not present

## 2014-12-17 DIAGNOSIS — K769 Liver disease, unspecified: Secondary | ICD-10-CM | POA: Diagnosis not present

## 2014-12-17 DIAGNOSIS — K7689 Other specified diseases of liver: Secondary | ICD-10-CM | POA: Diagnosis not present

## 2014-12-17 DIAGNOSIS — B009 Herpesviral infection, unspecified: Secondary | ICD-10-CM

## 2014-12-17 LAB — POCT I-STAT CREATININE: Creatinine, Ser: 0.8 mg/dL (ref 0.44–1.00)

## 2014-12-17 MED ORDER — GADOXETATE DISODIUM 0.25 MMOL/ML IV SOLN
8.0000 mL | Freq: Once | INTRAVENOUS | Status: AC | PRN
  Administered 2014-12-17: 8 mL via INTRAVENOUS

## 2015-01-19 ENCOUNTER — Telehealth: Payer: Self-pay

## 2015-01-19 NOTE — Telephone Encounter (Signed)
Pt called and states she just had a MRI in October and wants to know if she needs the one that is set for 02/02/2015.  Please advise

## 2015-01-19 NOTE — Telephone Encounter (Signed)
PLEASE CALL PT. I PERSONALLY REVIEWED HER MRI WITH RADIOLOGY(BOLES). AGREES SHE HAS BENIGN LIVER LESIONS. NO ADDITIONAL MRI IS NEEDED.

## 2015-01-19 NOTE — Telephone Encounter (Signed)
Noted. Called pt and LMOM. Appt was cancelled

## 2015-01-21 ENCOUNTER — Other Ambulatory Visit: Payer: Self-pay | Admitting: Family Medicine

## 2015-01-21 MED ORDER — LEVOTHYROXINE SODIUM 112 MCG PO TABS: 112.0000 ug | ORAL_TABLET | Freq: Every day | ORAL | Status: DC

## 2015-01-21 NOTE — Telephone Encounter (Signed)
Medication refilled per protocol. 

## 2015-01-22 ENCOUNTER — Other Ambulatory Visit: Payer: Self-pay | Admitting: *Deleted

## 2015-01-22 NOTE — Telephone Encounter (Signed)
Received fax requesting refill on Levothyroxine.   Refill appropriate and filled per protocol. 

## 2015-02-02 ENCOUNTER — Ambulatory Visit (HOSPITAL_COMMUNITY): Payer: Medicare Other

## 2015-04-21 ENCOUNTER — Encounter: Payer: Self-pay | Admitting: Gastroenterology

## 2015-05-01 ENCOUNTER — Other Ambulatory Visit: Payer: Self-pay | Admitting: Family Medicine

## 2015-05-01 NOTE — Telephone Encounter (Signed)
Refill appropriate and filled per protocol. 

## 2015-05-18 ENCOUNTER — Other Ambulatory Visit: Payer: Self-pay | Admitting: Family Medicine

## 2015-05-30 ENCOUNTER — Other Ambulatory Visit: Payer: Self-pay | Admitting: Family Medicine

## 2015-06-23 ENCOUNTER — Ambulatory Visit (INDEPENDENT_AMBULATORY_CARE_PROVIDER_SITE_OTHER): Payer: Medicare Other | Admitting: Family Medicine

## 2015-06-23 ENCOUNTER — Encounter: Payer: Self-pay | Admitting: Family Medicine

## 2015-06-23 VITALS — BP 126/84 | HR 86 | Temp 98.0°F | Resp 18 | Ht 68.0 in | Wt 182.0 lb

## 2015-06-23 DIAGNOSIS — N63 Unspecified lump in breast: Secondary | ICD-10-CM

## 2015-06-23 DIAGNOSIS — Z780 Asymptomatic menopausal state: Secondary | ICD-10-CM | POA: Diagnosis not present

## 2015-06-23 DIAGNOSIS — E038 Other specified hypothyroidism: Secondary | ICD-10-CM

## 2015-06-23 DIAGNOSIS — N6320 Unspecified lump in the left breast, unspecified quadrant: Secondary | ICD-10-CM

## 2015-06-23 NOTE — Progress Notes (Signed)
Subjective:    Patient ID: Shari Branch, female    DOB: July 04, 1945, 70 y.o.   MRN: [id number]  HPI  She has hypothyroidism and is due for repeat TSH. She denies any severe fatigue, hair loss, constipation, diarrhea, tremor, or palpitations. She is due to follow-up with her gastroenterologist and I recommended that she make an appointment for her nonalcoholic steatohepatitis and benign liver hemangiomas. She is due for mammogram. She also reports a lump in her left breast at approximately 4:00 from the nipple. It is extremely tender. Honestly on exam today I am unable to appreciate the lump that the patient states that it waxes and wanes. She denies any nipple discharge. She denies any recent injury. She also has a fissure on her tongue which can occasionally cause pain Past Medical History  Diagnosis Date  . Thyroid disease   . Hyperlipidemia   . Trigeminal neuralgia 1997    Trigemninal Tumor- Left, [place]  . Liver hemangioma   . Hepatic steatosis   . GERD 01/25/2007    Qualifier: Diagnosis of  By: Leeroy Bock LPN, Kim     Past Surgical History  Procedure Laterality Date  . Tonsillectomy    . Abdominal hysterectomy    . Cholecystectomy    .  trigeminal nerve tumor      numbness on left side of face  . Ear pinna reconstruction w/ rib graft      7 surgeries starting at age 82  . Breast reduction surgery    . [name] excision Right 09/19/2013    Procedure: EXCISION [name] FOOT;  Surgeon: Marcheta Grammes, DPM;  Location: [facility];  Service: Podiatry;  Laterality: Right;  . Ostectomy Right 09/19/2013    Procedure: OSTECTOMY;  Surgeon: Marcheta Grammes, DPM;  Location: [facility];  Service: Podiatry;  Laterality: Right;  . Esophagogastroduodenoscopy  2008    Dr. Oneida Alar: Reflux esophagitis, large hh, gastritis.   . Colonoscopy  2008    Dr. Oneida Alar: poor prep, inadequate exam  . Colonoscopy with esophagogastroduodenoscopy (egd)  2009    Dr. Oneida Alar:  good prep, normal TI, normal colon, hemorrhoids, gastritis, nodule in cardia (benign)  . Cataract extraction      bilateral  . Retinal detachment surgery      bilateral   Current Outpatient Prescriptions on File Prior to Visit  Medication Sig Dispense Refill  . amitriptyline (ELAVIL) 50 MG tablet TAKE FIVE TABLETS BY MOUTH ONCE DAILY AT BEDTIME 150 tablet 3  . Cyanocobalamin (B-12) 1000 MCG CAPS Take 1,000 mg by mouth daily.    Marland Kitchen escitalopram (LEXAPRO) 20 MG tablet TAKE ONE TABLET BY MOUTH ONCE DAILY 30 tablet 3  . levothyroxine (SYNTHROID, LEVOTHROID) 112 MCG tablet Take 1 tablet (112 mcg total) by mouth daily. 30 tablet 5  . Magnesium 500 MG TABS Take 500 mg by mouth daily.    . naproxen (NAPROSYN) 500 MG tablet Take 1 tablet (500 mg total) by mouth 2 (two) times daily with a meal. 60 tablet 0  . pantoprazole (PROTONIX) 40 MG tablet TAKE ONE TABLET BY MOUTH TWICE DAILY 180 tablet 3   No current facility-administered medications on file prior to visit.  Marland Kitchenall Social History   Social History  . Marital Status: Married    Spouse Name: N/A  . Number of Children: 3  . Years of Education: N/A   Occupational History  . PT - [employer]    Social History Main Topics  . Smoking status: Current Every  Day Smoker -- 1.50 packs/day  . Smokeless tobacco: Not on file  . Alcohol Use: No  . Drug Use: No  . Sexual Activity: Not on file   Other Topics Concern  . Not on file   Social History Narrative      Review of Systems  All other systems reviewed and are negative.      Objective:   Physical Exam  Constitutional: She appears well-developed and well-nourished. No distress.  Neck: No JVD present. No thyromegaly present.  Cardiovascular: Normal rate, regular rhythm and normal heart sounds.   No murmur heard. Pulmonary/Chest: Effort normal and breath sounds normal. No respiratory distress. She has no wheezes. She has no rales. She exhibits no tenderness.  Abdominal: Soft. Bowel sounds  are normal. She exhibits no distension. There is no tenderness. There is no rebound and no guarding.  Musculoskeletal: She exhibits no edema.  Skin: No rash noted. She is not diaphoretic.  Vitals reviewed.         Assessment & Plan:  Other specified hypothyroidism - Plan: COMPLETE METABOLIC PANEL WITH GFR, TSH, CBC with Differential/Platelet  Left breast lump - Plan: MM Digital Screening, US BREAST COMPLETE UNI LEFT INC AXILLA  Postmenopausal estrogen deficiency - Plan: DG Bone Density  I will check a TSH. The patient is due for her six-month TSH to monitor her hypothyroidism. I will schedule the patient for screening mammogram along with an ultrasound of the left breast to evaluate for the lump although I am not able to appreciated on exam today. She is due for a bone density scan. I recommended she follow up as planned with her gastroenterologist.

## 2015-06-24 LAB — CBC WITH DIFFERENTIAL/PLATELET
BASOS ABS: 55 {cells}/uL (ref 0–200)
Basophils Relative: 1 %
EOS ABS: 110 {cells}/uL (ref 15–500)
Eosinophils Relative: 2 %
HEMATOCRIT: 40.6 % (ref 35.0–45.0)
Hemoglobin: 13.8 g/dL (ref 12.0–15.0)
LYMPHS PCT: 28 %
Lymphs Abs: 1540 cells/uL (ref 850–3900)
MCH: 31.4 pg (ref 27.0–33.0)
MCHC: 34 g/dL (ref 32.0–36.0)
MCV: 92.5 fL (ref 80.0–100.0)
MONO ABS: 275 {cells}/uL (ref 200–950)
MPV: 10 fL (ref 7.5–12.5)
Monocytes Relative: 5 %
NEUTROS PCT: 64 %
Neutro Abs: 3520 cells/uL (ref 1500–7800)
Platelets: 197 10*3/uL (ref 140–400)
RBC: 4.39 MIL/uL (ref 3.80–5.10)
RDW: 13.5 % (ref 11.0–15.0)
WBC: 5.5 10*3/uL (ref 3.8–10.8)

## 2015-06-24 LAB — COMPLETE METABOLIC PANEL WITH GFR
ALBUMIN: 4.4 g/dL (ref 3.6–5.1)
ALT: 47 U/L — AB (ref 6–29)
AST: 47 U/L — ABNORMAL HIGH (ref 10–35)
Alkaline Phosphatase: 112 U/L (ref 33–130)
BILIRUBIN TOTAL: 0.4 mg/dL (ref 0.2–1.2)
BUN: 8 mg/dL (ref 7–25)
CO2: 22 mmol/L (ref 20–31)
CREATININE: 0.67 mg/dL (ref 0.60–0.93)
Calcium: 9.2 mg/dL (ref 8.6–10.4)
Chloride: 103 mmol/L (ref 98–110)
GFR, Est African American: >89 mL/min (ref >=60)
GFR, Est Non African American: 89 mL/min (ref >=60)
Glucose, Bld: 103 mg/dL — ABNORMAL HIGH (ref 70–99)
Potassium: 4.5 mmol/L (ref 3.5–5.3)
SODIUM: 138 mmol/L (ref 135–146)
TOTAL PROTEIN: 6.7 g/dL (ref 6.1–8.1)

## 2015-06-24 LAB — TSH: TSH: 3.45 mIU/L

## 2015-06-29 ENCOUNTER — Other Ambulatory Visit: Payer: Self-pay | Admitting: Family Medicine

## 2015-06-29 DIAGNOSIS — N6320 Unspecified lump in the left breast, unspecified quadrant: Secondary | ICD-10-CM

## 2015-07-07 DIAGNOSIS — Z780 Asymptomatic menopausal state: Secondary | ICD-10-CM | POA: Diagnosis not present

## 2015-07-07 DIAGNOSIS — N63 Unspecified lump in breast: Secondary | ICD-10-CM | POA: Diagnosis not present

## 2015-07-07 LAB — HM DEXA SCAN

## 2015-07-08 ENCOUNTER — Encounter: Payer: Self-pay | Admitting: Family Medicine

## 2015-07-20 DIAGNOSIS — H35373 Puckering of macula, bilateral: Secondary | ICD-10-CM | POA: Diagnosis not present

## 2015-07-27 ENCOUNTER — Other Ambulatory Visit (HOSPITAL_COMMUNITY): Payer: Self-pay | Admitting: Neurosurgery

## 2015-07-27 DIAGNOSIS — D496 Neoplasm of unspecified behavior of brain: Secondary | ICD-10-CM

## 2015-08-08 ENCOUNTER — Encounter: Payer: Self-pay | Admitting: Family Medicine

## 2015-08-10 ENCOUNTER — Telehealth: Payer: Self-pay

## 2015-08-10 NOTE — Telephone Encounter (Signed)
Tried to contact patient regarding bone density results.  No answer.  Patient has osteopenia in Left hip, suggest fosamaz 70mg  every week.  Will try to contact patient again.

## 2015-08-11 ENCOUNTER — Telehealth: Payer: Self-pay

## 2015-08-11 MED ORDER — ALENDRONATE SODIUM 70 MG PO TABS: 70.0000 mg | ORAL_TABLET | ORAL | Status: DC

## 2015-08-11 NOTE — Telephone Encounter (Signed)
Patient informed of bone density results.  Fosamax sent to pharmacy.  Patient aware.

## 2015-08-12 ENCOUNTER — Ambulatory Visit (HOSPITAL_COMMUNITY)
Admission: RE | Admit: 2015-08-12 | Discharge: 2015-08-12 | Disposition: A | Discharge status: Home / Self Care | Payer: Medicare Other | Source: Ambulatory Visit | Attending: Neurosurgery | Admitting: Neurosurgery

## 2015-08-12 DIAGNOSIS — D496 Neoplasm of unspecified behavior of brain: Secondary | ICD-10-CM | POA: Diagnosis not present

## 2015-08-12 LAB — POCT I-STAT CREATININE: CREATININE: 0.8 mg/dL (ref 0.44–1.00)

## 2015-08-12 MED ORDER — GADOBENATE DIMEGLUMINE 529 MG/ML IV SOLN
15.0000 mL | Freq: Once | INTRAVENOUS | Status: AC | PRN
  Administered 2015-08-12: 15 mL via INTRAVENOUS

## 2015-08-17 DIAGNOSIS — Z6828 Body mass index (BMI) 28.0-28.9, adult: Secondary | ICD-10-CM | POA: Diagnosis not present

## 2015-08-17 DIAGNOSIS — D496 Neoplasm of unspecified behavior of brain: Secondary | ICD-10-CM | POA: Diagnosis not present

## 2015-08-21 ENCOUNTER — Telehealth: Payer: Self-pay | Admitting: Family Medicine

## 2015-08-21 NOTE — Telephone Encounter (Signed)
Patient is calling to say that the alendronate is not working for her would like to be prescribed something different [phone]

## 2015-08-24 NOTE — Telephone Encounter (Signed)
prolia injection q 6 months.

## 2015-08-24 NOTE — Telephone Encounter (Signed)
Called and spoke to pt and she states that she can not take the Fosamax d/t stomach upset and joint pain. Would like to try something else.

## 2015-08-27 NOTE — Telephone Encounter (Signed)
Tried to call pt no answer and no vm.

## 2015-08-29 ENCOUNTER — Other Ambulatory Visit: Payer: Self-pay | Admitting: Family Medicine

## 2015-09-23 ENCOUNTER — Telehealth: Payer: Self-pay | Admitting: Gastroenterology

## 2015-09-23 NOTE — Telephone Encounter (Signed)
Pt called to make follow up OV and is aware of OV on 8/22 at 11 with LSL. She is also wanting to have MRI of Liver done prior to OV. I didn't see anything in the recall list that this was due. Please advise and call her at [phone]

## 2015-09-23 NOTE — Telephone Encounter (Signed)
PLEASE CALL PT. SHE DOESN'T NEED A LIVER MRI. RADIOLOGY DID NOT RECOMMEND ANY ADDITIONAL IMAGING. SHE HAS A BENIGN HEMANGIOMA OF THE LIVER.

## 2015-09-23 NOTE — Telephone Encounter (Signed)
Pt is wanting to have a MRI done before her office visit. I don't see where she needs one. Please advise

## 2015-09-24 NOTE — Telephone Encounter (Signed)
Tried to call with no answer  

## 2015-09-25 NOTE — Telephone Encounter (Signed)
Pt is aware that she will not need an MRI

## 2015-09-28 ENCOUNTER — Other Ambulatory Visit: Payer: Self-pay | Admitting: Family Medicine

## 2015-09-28 ENCOUNTER — Ambulatory Visit: Payer: Medicare Other | Admitting: Family Medicine

## 2015-09-29 NOTE — Telephone Encounter (Signed)
Refill appropriate and filled per protocol. 

## 2015-10-20 DIAGNOSIS — N9089 Other specified noninflammatory disorders of vulva and perineum: Secondary | ICD-10-CM | POA: Diagnosis not present

## 2015-10-20 DIAGNOSIS — N9489 Other specified conditions associated with female genital organs and menstrual cycle: Secondary | ICD-10-CM | POA: Diagnosis not present

## 2015-10-20 DIAGNOSIS — N8110 Cystocele, unspecified: Secondary | ICD-10-CM | POA: Diagnosis not present

## 2015-10-20 DIAGNOSIS — Z01419 Encounter for gynecological examination (general) (routine) without abnormal findings: Secondary | ICD-10-CM | POA: Diagnosis not present

## 2015-10-27 ENCOUNTER — Encounter: Payer: Self-pay | Admitting: Gastroenterology

## 2015-10-27 ENCOUNTER — Ambulatory Visit (INDEPENDENT_AMBULATORY_CARE_PROVIDER_SITE_OTHER): Payer: Medicare Other | Admitting: Gastroenterology

## 2015-10-27 VITALS — BP 102/62 | HR 103 | Temp 97.6°F | Ht 67.0 in | Wt 176.0 lb

## 2015-10-27 DIAGNOSIS — K219 Gastro-esophageal reflux disease without esophagitis: Secondary | ICD-10-CM | POA: Diagnosis not present

## 2015-10-27 DIAGNOSIS — K5900 Constipation, unspecified: Secondary | ICD-10-CM | POA: Diagnosis not present

## 2015-10-27 DIAGNOSIS — D1803 Hemangioma of intra-abdominal structures: Secondary | ICD-10-CM | POA: Diagnosis not present

## 2015-10-27 DIAGNOSIS — K7581 Nonalcoholic steatohepatitis (NASH): Secondary | ICD-10-CM | POA: Diagnosis not present

## 2015-10-27 NOTE — Progress Notes (Signed)
Primary Care Physician: Odette Fraction, MD  Primary Gastroenterologist:  Barney Drain, MD   Chief Complaint  Patient presents with  . Follow-up    HPI: Shari Branch is a 70 y.o. female here for follow-up. She was last seen in September 2016. She has a history of hepatic steatosis, liver lesions felt to be benign. Previously biopsied at [facility] in 2010 felt to be hemangioma. Has been followed with MRI abdomens but is felt no further workup is needed based on last imaging in October 2016. She did have steatosis noted on liver biopsy but no cirrhosis.   She has no questions or concerns. Heartburn is well controlled. Continues to eat smaller portions which helps. Constipation well controlled. Her last colonoscopy was in 2009. Paternal grandmother had colon cancer at age 79. Patient is not interested in pursuing colonoscopy at this time.    Current Outpatient Prescriptions  Medication Sig Dispense Refill  . alendronate (FOSAMAX) 70 MG tablet Take 1 tablet (70 mg total) by mouth every 7 (seven) days. Take with a full glass of water on an empty stomach. 4 tablet 11  . amitriptyline (ELAVIL) 50 MG tablet TAKE FIVE TABLETS BY MOUTH ONCE DAILY AT BEDTIME 150 tablet 3  . Cyanocobalamin (B-12) 1000 MCG CAPS Take 1,000 mg by mouth daily.    Marland Kitchen escitalopram (LEXAPRO) 20 MG tablet TAKE ONE TABLET BY MOUTH ONCE DAILY 30 tablet 0  . levothyroxine (SYNTHROID, LEVOTHROID) 112 MCG tablet TAKE ONE TABLET BY MOUTH ONCE DAILY 30 tablet 11  . pantoprazole (PROTONIX) 40 MG tablet TAKE ONE TABLET BY MOUTH TWICE DAILY 180 tablet 3   No current facility-administered medications for this visit.     Allergies as of 10/27/2015 - Review Complete 10/27/2015  Allergen Reaction Noted  . Sulfonamide derivatives  01/25/2007    ROS:  General: Negative for anorexia, weight loss, fever, chills, fatigue, weakness. ENT: Negative for hoarseness, difficulty swallowing , nasal congestion. CV: Negative for chest  pain, angina, palpitations, dyspnea on exertion, peripheral edema.  Respiratory: Negative for dyspnea at rest, dyspnea on exertion, cough, sputum, wheezing.  GI: See history of present illness. GU:  Negative for dysuria, hematuria, urinary incontinence, urinary frequency, nocturnal urination.  Endo: Negative for unusual weight change.    Physical Examination:   BP 102/62   Pulse (!) 103   Temp 97.6 F (36.4 C) (Oral)   Ht 5\' 7"  (1.702 m)   Wt 176 lb (79.8 kg)   BMI 27.57 kg/m   General: Well-nourished, well-developed in no acute distress.  Eyes: No icterus. Mouth: Oropharyngeal mucosa moist and pink , no lesions erythema or exudate. Lungs: Clear to auscultation bilaterally.  Heart: Regular rate and rhythm, no murmurs rubs or gallops.  Abdomen: Bowel sounds are normal, nontender, nondistended, no hepatosplenomegaly or masses, no abdominal bruits or hernia , no rebound or guarding.   Extremities: No lower extremity edema. No clubbing or deformities. Neuro: Alert and oriented x 4   Skin: Warm and dry, no jaundice.   Psych: Alert and cooperative, normal mood and affect.  Past Surgical History:  Procedure Laterality Date  .  trigeminal nerve tumor     numbness on left side of face  . ABDOMINAL HYSTERECTOMY    . BREAST REDUCTION SURGERY    . CATARACT EXTRACTION     bilateral  . CHOLECYSTECTOMY    . COLONOSCOPY  2008   Dr. Oneida Alar: poor prep, inadequate exam  . COLONOSCOPY WITH ESOPHAGOGASTRODUODENOSCOPY (EGD)  2009  Dr. Oneida Alar: good prep, normal TI, normal colon, hemorrhoids, gastritis, nodule in cardia (benign)  . EAR PINNA RECONSTRUCTION W/ RIB GRAFT     7 surgeries starting at age 27  . ESOPHAGOGASTRODUODENOSCOPY  2008   Dr. Oneida Alar: Reflux esophagitis, large hh, gastritis.   Marland Kitchen [name] EXCISION Right 09/19/2013   Procedure: EXCISION [name] FOOT;  Surgeon: Marcheta Grammes, DPM;  Location: [facility];  Service: Podiatry;  Laterality: Right;  . OSTECTOMY Right  09/19/2013   Procedure: OSTECTOMY;  Surgeon: Marcheta Grammes, DPM;  Location: [facility];  Service: Podiatry;  Laterality: Right;  . RETINAL DETACHMENT SURGERY     bilateral  . TONSILLECTOMY     Family History  Problem Relation Age of Onset  . Colon cancer Paternal Grandmother 71  . Pancreatic cancer Father     deceased age 73  . Other      retinal detachment in multiple family member at early age     Labs:  Lab Results  Component Value Date   WBC 5.5 06/23/2015   HGB 13.8 06/23/2015   HCT 40.6 06/23/2015   MCV 92.5 06/23/2015   PLT 197 06/23/2015   Lab Results  Component Value Date   ALT 47 (H) 06/23/2015   AST 47 (H) 06/23/2015   ALKPHOS 112 06/23/2015   BILITOT 0.4 06/23/2015   Lab Results  Component Value Date   CREATININE 0.80 08/12/2015   BUN 8 06/23/2015   NA 138 06/23/2015   K 4.5 06/23/2015   CL 103 06/23/2015   CO2 22 06/23/2015   Lab Results  Component Value Date   HEPAIGM NEG 10/02/2008   HEPBIGM NEG 10/02/2008  Hep b surf ab negative 2015 HCV AB negative 2015  Imaging Studies: No results found.

## 2015-10-27 NOTE — Patient Instructions (Signed)
1. Continue to watch your weight and be as active as you can for management of your fatty liver. You should have your liver numbers checked at least yearly.  2. Continue pantoprazole as before.  3. Let us know when you are ready for a colonoscopy (if you decide you want to have another one). 4. Return to the office in one year or sooner if needed.

## 2015-10-29 ENCOUNTER — Other Ambulatory Visit: Payer: Self-pay | Admitting: Family Medicine

## 2015-10-30 ENCOUNTER — Encounter: Payer: Self-pay | Admitting: Gastroenterology

## 2015-10-30 NOTE — Progress Notes (Signed)
Please NIC for six-month follow-up instead of one year follow-up

## 2015-10-30 NOTE — Assessment & Plan Note (Signed)
MRI abdomen one year ago showed improved hepatic hypervascular lesions compared to 2010 study. Per Dr. Oneida Alar, no further workup needed.

## 2015-10-30 NOTE — Assessment & Plan Note (Signed)
Previous liver biopsy around 2010 with steatohepatitis. No evidence of cirrhosis. Encouraged to maintain weight and activity level. She has minimally elevated AST/ALT at this time. Recommend office visit in 6 months with Dr. Oneida Alar.

## 2015-10-30 NOTE — Assessment & Plan Note (Signed)
Doing well at this time  

## 2015-10-30 NOTE — Assessment & Plan Note (Signed)
Well-controlled on pantoprazole twice a day

## 2015-11-02 NOTE — Progress Notes (Signed)
ON RECALL FOR 6 MONTH FU

## 2015-11-02 NOTE — Progress Notes (Signed)
CC'ED TO PCP 

## 2015-11-06 ENCOUNTER — Other Ambulatory Visit: Payer: Self-pay

## 2015-11-13 ENCOUNTER — Encounter: Payer: Self-pay | Admitting: Family Medicine

## 2015-11-13 ENCOUNTER — Ambulatory Visit (INDEPENDENT_AMBULATORY_CARE_PROVIDER_SITE_OTHER): Payer: Medicare Other | Admitting: Family Medicine

## 2015-11-13 VITALS — BP 100/68 | HR 94 | Temp 97.6°F | Resp 16 | Ht 68.0 in | Wt 177.0 lb

## 2015-11-13 DIAGNOSIS — K1120 Sialoadenitis, unspecified: Secondary | ICD-10-CM

## 2015-11-13 MED ORDER — AMOXICILLIN 875 MG PO TABS: 875.0000 mg | ORAL_TABLET | Freq: Two times a day (BID) | ORAL | 0 refills | Status: DC

## 2015-11-13 NOTE — Progress Notes (Signed)
Subjective:    Patient ID: Shari Branch, female    DOB: 11-22-1945, 70 y.o.   MRN: [id number]  HPI Patient states that earlier this week, her left parotid gland, and her left submandibular gland became extremely swollen, tender to the touch, painful. She reported a burning tingling sensation in her left cheek and underneath her left jaw. She started taking some amoxicillin for 3 days that her husband had. Almost immediately, the swelling improved as did the pain. Today on examination, there is no palpable mass in the parotid gland or in the left submandibular gland. There is no lymphadenopathy. There is no erythema in the throat. There is no abnormality in her left tympanic membrane or in the left auditory canal. Patient states that she is feeling better Past Medical History:  Diagnosis Date  . GERD 01/25/2007   Qualifier: Diagnosis of  By: [place] LPN, Maudie Mercury    . Hepatic steatosis   . Hyperlipidemia   . Liver hemangioma   . Osteopenia    6/17 T -2.1 Left hip  . Thyroid disease   . 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  trigeminal nerve tumor     numbness on left side of face  . ABDOMINAL HYSTERECTOMY    . BREAST REDUCTION SURGERY    . CATARACT EXTRACTION     bilateral  . CHOLECYSTECTOMY    . COLONOSCOPY  2008   Dr. Oneida Alar: poor prep, inadequate exam  . COLONOSCOPY WITH ESOPHAGOGASTRODUODENOSCOPY (EGD)  2009   Dr. Oneida Alar: good prep, normal TI, normal colon, hemorrhoids, gastritis, nodule in cardia (benign)  . EAR PINNA RECONSTRUCTION W/ RIB GRAFT     7 surgeries starting at age 7  . ESOPHAGOGASTRODUODENOSCOPY  2008   Dr. Oneida Alar: Reflux esophagitis, large hh, gastritis.   Marland Kitchen [name] EXCISION Right 09/19/2013   Procedure: EXCISION [name] FOOT;  Surgeon: Marcheta Grammes, DPM;  Location: [facility];  Service: Podiatry;  Laterality: Right;  . OSTECTOMY Right 09/19/2013     Procedure: OSTECTOMY;  Surgeon: Marcheta Grammes, DPM;  Location: [facility];  Service: Podiatry;  Laterality: Right;  . RETINAL DETACHMENT SURGERY     bilateral  . TONSILLECTOMY     Current Outpatient Prescriptions on File Prior to Visit  Medication Sig Dispense Refill  . alendronate (FOSAMAX) 70 MG tablet Take 1 tablet (70 mg total) by mouth every 7 (seven) days. Take with a full glass of water on an empty stomach. 4 tablet 11  . amitriptyline (ELAVIL) 50 MG tablet TAKE FIVE TABLETS BY MOUTH ONCE DAILY AT BEDTIME 150 tablet 3  . Cyanocobalamin (B-12) 1000 MCG CAPS Take 1,000 mg by mouth daily.    Marland Kitchen escitalopram (LEXAPRO) 20 MG tablet TAKE ONE TABLET BY MOUTH ONCE DAILY 30 tablet 0  . levothyroxine (SYNTHROID, LEVOTHROID) 112 MCG tablet TAKE ONE TABLET BY MOUTH ONCE DAILY 30 tablet 11  . pantoprazole (PROTONIX) 40 MG tablet TAKE ONE TABLET BY MOUTH TWICE DAILY 180 tablet 3   No current facility-administered medications on file prior to visit.    Allergies  Allergen Reactions  . Sulfonamide Derivatives     REACTION: Joint Pain and locking - walks like a 70 year old   Social History   Social History  . Marital status: Married    Spouse name: N/A  . Number of children: 3  . Years of education: N/A   Occupational History  .  PT - [employer]    Social History Main Topics  . Smoking status: Current Every Day Smoker    Packs/day: 1.50  . Smokeless tobacco: Not on file  . Alcohol use No  . Drug use: No  . Sexual activity: Not on file   Other Topics Concern  . Not on file   Social History Narrative  . No narrative on file      Review of Systems  All other systems reviewed and are negative.      Objective:   Physical Exam  Constitutional: She appears well-developed and well-nourished.  HENT:  Left Ear: External ear normal.  Nose: Nose normal.  Mouth/Throat: Oropharynx is clear and moist.  Eyes: Conjunctivae are normal.  Neck: Neck supple. No thyromegaly present.   Cardiovascular: Normal rate, regular rhythm, normal heart sounds and intact distal pulses.   No murmur heard. Pulmonary/Chest: Effort normal and breath sounds normal. No stridor. No respiratory distress. She has no wheezes. She has no rales.  Lymphadenopathy:    She has no cervical adenopathy.  Vitals reviewed.         Assessment & Plan:  Sialadenitis - Plan: amoxicillin (AMOXIL) 875 MG tablet  I suspect that she had sialadenitis.  Begin amoxicillin 875 mg by mouth twice a day to complete 10 total days of therapy. Recheck next week if persistent or sooner if worse

## 2015-11-30 ENCOUNTER — Other Ambulatory Visit: Payer: Self-pay | Admitting: Family Medicine

## 2015-12-07 ENCOUNTER — Other Ambulatory Visit: Payer: Self-pay | Admitting: Family Medicine

## 2015-12-07 NOTE — Telephone Encounter (Signed)
RX refilled per protocol 

## 2016-01-12 ENCOUNTER — Encounter: Payer: Self-pay | Admitting: Family Medicine

## 2016-01-12 DIAGNOSIS — N6002 Solitary cyst of left breast: Secondary | ICD-10-CM | POA: Diagnosis not present

## 2016-01-12 DIAGNOSIS — N6320 Unspecified lump in the left breast, unspecified quadrant: Secondary | ICD-10-CM | POA: Diagnosis not present

## 2016-02-08 DIAGNOSIS — H35373 Puckering of macula, bilateral: Secondary | ICD-10-CM | POA: Diagnosis not present

## 2016-03-02 ENCOUNTER — Encounter: Payer: Self-pay | Admitting: Gastroenterology

## 2016-04-05 ENCOUNTER — Other Ambulatory Visit: Payer: Self-pay | Admitting: Family Medicine

## 2016-04-07 ENCOUNTER — Other Ambulatory Visit: Payer: Self-pay | Admitting: Family Medicine

## 2016-04-07 MED ORDER — NAPROXEN 500 MG PO TABS: 500.0000 mg | ORAL_TABLET | Freq: Two times a day (BID) | ORAL | 2 refills | Status: DC

## 2016-04-07 NOTE — Telephone Encounter (Signed)
Requesting a refill on Naproxen - Medication called/sent to requested pharmacy

## 2016-05-02 DIAGNOSIS — R339 Retention of urine, unspecified: Secondary | ICD-10-CM | POA: Diagnosis not present

## 2016-05-02 DIAGNOSIS — Z720 Tobacco use: Secondary | ICD-10-CM | POA: Diagnosis not present

## 2016-05-02 DIAGNOSIS — Z6828 Body mass index (BMI) 28.0-28.9, adult: Secondary | ICD-10-CM | POA: Diagnosis not present

## 2016-05-02 DIAGNOSIS — N8111 Cystocele, midline: Secondary | ICD-10-CM | POA: Diagnosis not present

## 2016-05-26 ENCOUNTER — Other Ambulatory Visit: Payer: Self-pay | Admitting: Family Medicine

## 2016-05-27 NOTE — Telephone Encounter (Signed)
Medication refill for one time only.  Patient needs to be seen.  Letter sent for patient to call and schedule 

## 2016-06-14 ENCOUNTER — Ambulatory Visit (INDEPENDENT_AMBULATORY_CARE_PROVIDER_SITE_OTHER): Payer: Medicare Other | Admitting: Family Medicine

## 2016-06-14 VITALS — BP 102/78 | HR 89 | Temp 97.9°F | Resp 14 | Wt 182.0 lb

## 2016-06-14 DIAGNOSIS — E039 Hypothyroidism, unspecified: Secondary | ICD-10-CM

## 2016-06-14 DIAGNOSIS — Z23 Encounter for immunization: Secondary | ICD-10-CM | POA: Diagnosis not present

## 2016-06-14 DIAGNOSIS — M545 Low back pain, unspecified: Secondary | ICD-10-CM

## 2016-06-14 DIAGNOSIS — Z13220 Encounter for screening for lipoid disorders: Secondary | ICD-10-CM

## 2016-06-14 NOTE — Addendum Note (Signed)
Addended by: Vonna Kotyk A on: 06/14/2016 03:26 PM   Modules accepted: Orders

## 2016-06-14 NOTE — Progress Notes (Signed)
Subjective:    Patient ID: Shari Branch, female    DOB: 1945-10-21, 71 y.o.   MRN: [id number]  HPI Patient has a history of hypothyroidism for which she currently takes levothyroxine. Her last TSH was one year ago. She is due for repeat TSH. She denies any fatigue, heat or cold intolerance, tachycardia, dry mouth, hair loss, or constipation. She is due also for screening cholesterol level. She has a history of nonalcoholic steatohepatitis. She is due to repeat a CMP to monitor her liver function test. She also reports 1-2 weeks of midline low back pain around the level of L4-L5. She denies any radiation into her legs. She denies any symptoms of cauda equina syndrome. She denies any bowel or bladder incontinence area she denies any falls or injury. Mammogram is up-to-date. She is due for Prevnar 13. She's had Pneumovax 23. She refuses a flu shot bone density is not due again until 2019. She does have osteopenia Past Medical History:  Diagnosis Date  . GERD 01/25/2007   Qualifier: Diagnosis of  By: [place] LPN, Maudie Mercury    . Hepatic steatosis   . Hyperlipidemia   . Liver hemangioma   . Osteopenia    6/17 T -2.1 Left hip  . Thyroid disease   . Trigeminal neuralgia 1997   Trigemninal Tumor- Left, [place]   Past Surgical History:  Procedure Laterality Date  .  trigeminal nerve tumor     numbness on left side of face  . ABDOMINAL HYSTERECTOMY    . BREAST REDUCTION SURGERY    . CATARACT EXTRACTION     bilateral  . CHOLECYSTECTOMY    . COLONOSCOPY  2008   Dr. Oneida Alar: poor prep, inadequate exam  . COLONOSCOPY WITH ESOPHAGOGASTRODUODENOSCOPY (EGD)  2009   Dr. Oneida Alar: good prep, normal TI, normal colon, hemorrhoids, gastritis, nodule in cardia (benign)  . EAR PINNA RECONSTRUCTION W/ RIB GRAFT     7 surgeries starting at age 29  . ESOPHAGOGASTRODUODENOSCOPY  2008   Dr. Oneida Alar: Reflux esophagitis, large hh, gastritis.   Marland Kitchen [name] EXCISION Right 09/19/2013   Procedure:  EXCISION [name] FOOT;  Surgeon: Marcheta Grammes, DPM;  Location: [facility];  Service: Podiatry;  Laterality: Right;  . OSTECTOMY Right 09/19/2013   Procedure: OSTECTOMY;  Surgeon: Marcheta Grammes, DPM;  Location: [facility];  Service: Podiatry;  Laterality: Right;  . RETINAL DETACHMENT SURGERY     bilateral  . TONSILLECTOMY     Current Outpatient Prescriptions on File Prior to Visit  Medication Sig Dispense Refill  . amitriptyline (ELAVIL) 50 MG tablet TAKE FIVE TABLETS BY MOUTH ONCE DAILY AT BEDTIME 150 tablet 3  . escitalopram (LEXAPRO) 20 MG tablet TAKE ONE TABLET BY MOUTH ONCE DAILY 30 tablet 0  . levothyroxine (SYNTHROID, LEVOTHROID) 112 MCG tablet TAKE ONE TABLET BY MOUTH ONCE DAILY 30 tablet 11  . naproxen (NAPROSYN) 500 MG tablet Take 1 tablet (500 mg total) by mouth 2 (two) times daily with a meal. 60 tablet 2  . pantoprazole (PROTONIX) 40 MG tablet TAKE ONE TABLET BY MOUTH TWICE DAILY 180 tablet 3   No current facility-administered medications on file prior to visit.    Allergies  Allergen Reactions  . Sulfonamide Derivatives     REACTION: Joint Pain and locking - walks like a 71 year old   Social History   Social History  . Marital status: Married    Spouse name: N/A  . Number of children: 3  . Years  of education: N/A   Occupational History  . PT - [employer]    Social History Main Topics  . Smoking status: Current Every Day Smoker    Packs/day: 1.50  . Smokeless tobacco: Not on file  . Alcohol use No  . Drug use: No  . Sexual activity: Not on file   Other Topics Concern  . Not on file   Social History Narrative  . No narrative on file      Review of Systems  All other systems reviewed and are negative.      Objective:   Physical Exam  Constitutional: She appears well-developed and well-nourished. No distress.  Neck: No JVD present. No thyromegaly present.  Cardiovascular: Normal rate, regular rhythm and normal heart sounds.  Exam reveals  no gallop.   No murmur heard. Pulmonary/Chest: Effort normal and breath sounds normal. No respiratory distress. She has no wheezes. She has no rales.  Abdominal: Soft. Bowel sounds are normal. She exhibits no distension. There is no tenderness. There is no rebound.  Musculoskeletal:       Lumbar back: She exhibits decreased range of motion and tenderness. She exhibits no bony tenderness.  Lymphadenopathy:    She has no cervical adenopathy.  Skin: She is not diaphoretic.  Vitals reviewed.         Assessment & Plan:  Hypothyroidism, unspecified type - Plan: CBC with Differential/Platelet, COMPLETE METABOLIC PANEL WITH GFR, TSH  Screening cholesterol level - Plan: CBC with Differential/Platelet, COMPLETE METABOLIC PANEL WITH GFR, Lipid panel  Acute midline low back pain without sciatica - Plan: DG Lumbar Spine Complete  Blood pressure today is well controlled. I will screen the patient for hyperlipidemia with a fasting lipid panel. I will check a TSH to ensure that her levothyroxine is appropriately dosed. Patient received Prevnar 17 today in office to update her immunizations. I believe her low back pain is likely due to degenerative disc disease but I will begin by obtaining an x-ray of the lumbar spine

## 2016-06-15 ENCOUNTER — Ambulatory Visit (HOSPITAL_COMMUNITY)
Admission: RE | Admit: 2016-06-15 | Discharge: 2016-06-15 | Disposition: A | Discharge status: Home / Self Care | Payer: Medicare Other | Source: Ambulatory Visit | Attending: Family Medicine | Admitting: Family Medicine

## 2016-06-15 DIAGNOSIS — M47896 Other spondylosis, lumbar region: Secondary | ICD-10-CM | POA: Diagnosis not present

## 2016-06-15 DIAGNOSIS — M4186 Other forms of scoliosis, lumbar region: Secondary | ICD-10-CM | POA: Insufficient documentation

## 2016-06-15 DIAGNOSIS — M545 Low back pain, unspecified: Secondary | ICD-10-CM

## 2016-06-15 LAB — COMPLETE METABOLIC PANEL WITH GFR
ALT: 49 U/L — ABNORMAL HIGH (ref 6–29)
AST: 70 U/L — ABNORMAL HIGH (ref 10–35)
Albumin: 4.5 g/dL (ref 3.6–5.1)
Alkaline Phosphatase: 108 U/L (ref 33–130)
BILIRUBIN TOTAL: 0.5 mg/dL (ref 0.2–1.2)
BUN: 7 mg/dL (ref 7–25)
CHLORIDE: 100 mmol/L (ref 98–110)
CO2: 26 mmol/L (ref 20–31)
Calcium: 9.2 mg/dL (ref 8.6–10.4)
Creat: 0.8 mg/dL (ref 0.60–0.93)
GFR, EST AFRICAN AMERICAN: 86 mL/min (ref >=60)
GFR, EST NON AFRICAN AMERICAN: 74 mL/min (ref >=60)
GLUCOSE: 95 mg/dL (ref 70–99)
POTASSIUM: 4.5 mmol/L (ref 3.5–5.3)
SODIUM: 134 mmol/L — AB (ref 135–146)
Total Protein: 6.5 g/dL (ref 6.1–8.1)

## 2016-06-15 LAB — CBC WITH DIFFERENTIAL/PLATELET
BASOS ABS: 57 {cells}/uL (ref 0–200)
Basophils Relative: 1 %
EOS ABS: 114 {cells}/uL (ref 15–500)
EOS PCT: 2 %
HCT: 41.7 % (ref 35.0–45.0)
Hemoglobin: 13.9 g/dL (ref 12.0–15.0)
LYMPHS PCT: 28 %
Lymphs Abs: 1596 cells/uL (ref 850–3900)
MCH: 30.2 pg (ref 27.0–33.0)
MCHC: 33.3 g/dL (ref 32.0–36.0)
MCV: 90.7 fL (ref 80.0–100.0)
MONOS PCT: 4 %
MPV: 9.9 fL (ref 7.5–12.5)
Monocytes Absolute: 228 cells/uL (ref 200–950)
NEUTROS ABS: 3705 {cells}/uL (ref 1500–7800)
NEUTROS PCT: 65 %
PLATELETS: 194 10*3/uL (ref 140–400)
RBC: 4.6 MIL/uL (ref 3.80–5.10)
RDW: 13.5 % (ref 11.0–15.0)
WBC: 5.7 10*3/uL (ref 3.8–10.8)

## 2016-06-15 LAB — LIPID PANEL
CHOL/HDL RATIO: 5.6 ratio — AB (ref <5.0)
Cholesterol: 271 mg/dL — ABNORMAL HIGH (ref <200)
HDL: 48 mg/dL — AB (ref >50)
LDL CALC: 181 mg/dL — AB (ref <100)
TRIGLYCERIDES: 209 mg/dL — AB (ref <150)
VLDL: 42 mg/dL — ABNORMAL HIGH (ref <30)

## 2016-06-15 LAB — TSH: TSH: 1.29 m[IU]/L

## 2016-06-16 ENCOUNTER — Other Ambulatory Visit: Payer: Self-pay | Admitting: Family Medicine

## 2016-06-16 DIAGNOSIS — Z79899 Other long term (current) drug therapy: Secondary | ICD-10-CM

## 2016-06-16 DIAGNOSIS — E785 Hyperlipidemia, unspecified: Secondary | ICD-10-CM

## 2016-06-16 MED ORDER — ATORVASTATIN CALCIUM 20 MG PO TABS: 20.0000 mg | ORAL_TABLET | Freq: Every day | ORAL | 3 refills | Status: DC

## 2016-06-23 ENCOUNTER — Ambulatory Visit (INDEPENDENT_AMBULATORY_CARE_PROVIDER_SITE_OTHER): Payer: Medicare Other | Admitting: Physician Assistant

## 2016-06-23 ENCOUNTER — Encounter: Payer: Self-pay | Admitting: Physician Assistant

## 2016-06-23 VITALS — BP 110/74 | HR 83 | Temp 98.0°F | Resp 16 | Wt 180.2 lb

## 2016-06-23 DIAGNOSIS — J440 Chronic obstructive pulmonary disease with acute lower respiratory infection: Secondary | ICD-10-CM

## 2016-06-23 DIAGNOSIS — J209 Acute bronchitis, unspecified: Secondary | ICD-10-CM

## 2016-06-23 MED ORDER — AZITHROMYCIN 250 MG PO TABS: ORAL_TABLET | ORAL | 0 refills | Status: DC

## 2016-06-23 MED ORDER — ALBUTEROL SULFATE HFA 108 (90 BASE) MCG/ACT IN AERS: 2.0000 | INHALATION_SPRAY | Freq: Four times a day (QID) | RESPIRATORY_TRACT | 2 refills | Status: DC | PRN

## 2016-06-23 MED ORDER — PREDNISONE 20 MG PO TABS: ORAL_TABLET | ORAL | 0 refills | Status: DC

## 2016-06-23 NOTE — Progress Notes (Signed)
    Patient ID: Shari Branch MRN: [id number], DOB: 09-06-1945, 71 y.o. Date of Encounter: 06/23/2016, 11:37 AM    Chief Complaint:  Chief Complaint  Patient presents with  . Cough    1wk  .   .   .      HPI: 71 y.o. year old female presents with above.   Says that she has had chest congestion and cough for 8 days. Says that she is coughing up phlegm. Some mild headache but getting no mucus out of the nose. Says that she feels exhausted. Has had no fevers or chills. She does smoke.    Home Meds:   Outpatient Medications Prior to Visit  Medication Sig Dispense Refill  . amitriptyline (ELAVIL) 50 MG tablet TAKE FIVE TABLETS BY MOUTH ONCE DAILY AT BEDTIME 150 tablet 3  . atorvastatin (LIPITOR) 20 MG tablet Take 1 tablet (20 mg total) by mouth daily. 90 tablet 3  . escitalopram (LEXAPRO) 20 MG tablet TAKE ONE TABLET BY MOUTH ONCE DAILY 30 tablet 0  . levothyroxine (SYNTHROID, LEVOTHROID) 112 MCG tablet TAKE ONE TABLET BY MOUTH ONCE DAILY 30 tablet 11  . naproxen (NAPROSYN) 500 MG tablet Take 1 tablet (500 mg total) by mouth 2 (two) times daily with a meal. 60 tablet 2  . pantoprazole (PROTONIX) 40 MG tablet TAKE ONE TABLET BY MOUTH TWICE DAILY 180 tablet 3   No facility-administered medications prior to visit.     Allergies:  Allergies  Allergen Reactions  . Sulfonamide Derivatives     REACTION: Joint Pain and locking - walks like a 71 year old      Review of Systems: See HPI for pertinent ROS. All other ROS negative.    Physical Exam: Blood pressure 110/74, pulse 83, temperature 98 F (36.7 C), temperature source Oral, resp. rate 16, weight 180 lb 3.2 oz (81.7 kg), SpO2 95 %., Body mass index is 27.4 kg/m. General:  WNWD Female. Appears in no acute distress. HEENT: Normocephalic, atraumatic, eyes without discharge, sclera non-icteric, nares are without discharge. Right Ear with h/o multiple surgeries. Left Ear--- canal clear, TM without perforation, pearly grey and  translucent with reflective cone of light. Oral cavity moist, posterior pharynx without exudate, erythema, peritonsillar abscess.  Neck: Supple. No thyromegaly. No lymphadenopathy. Lungs: Mild crackles and wheezes at bases, left greater than right. Remainder of lungs are clear. Heart: Regular rhythm. No murmurs, rubs, or gallops. Msk:  Strength and tone normal for age. Extremities/Skin: Warm and dry. Neuro: Alert and oriented X 3. Moves all extremities spontaneously. Gait is normal. CNII-XII grossly in tact. Psych:  Responds to questions appropriately with a normal affect.     ASSESSMENT AND PLAN:  71 y.o. year old female with  1. Acute bronchitis with COPD ([place]) Start the azithromycin and prednisone as soon as possible and take as directed. Use the albuterol as needed/as directed. Follow-up if symptoms do not resolve after completion of antibiotic and prednisone. - azithromycin (ZITHROMAX) 250 MG tablet; Day 1: take 2 daily. Days 2 -5: Take 1 daily.  Dispense: 6 tablet; Refill: 0 - albuterol (PROVENTIL HFA;VENTOLIN HFA) 108 (90 Base) MCG/ACT inhaler; Inhale 2 puffs into the lungs every 6 (six) hours as needed for wheezing or shortness of breath.  Dispense: 1 Inhaler; Refill: 2 - predniSONE (DELTASONE) 20 MG tablet; Take 2 daily for 2 days then 1 daily for 3 days  Dispense: 7 tablet; Refill: 0   Signed, [address] [place], [place], [facility] 06/23/2016 11:37 AM

## 2016-06-30 ENCOUNTER — Other Ambulatory Visit: Payer: Self-pay | Admitting: Family Medicine

## 2016-07-05 ENCOUNTER — Other Ambulatory Visit (HOSPITAL_COMMUNITY): Payer: Self-pay | Admitting: Neurosurgery

## 2016-07-05 DIAGNOSIS — D496 Neoplasm of unspecified behavior of brain: Secondary | ICD-10-CM

## 2016-07-08 DIAGNOSIS — R339 Retention of urine, unspecified: Secondary | ICD-10-CM | POA: Diagnosis not present

## 2016-07-08 DIAGNOSIS — N3946 Mixed incontinence: Secondary | ICD-10-CM | POA: Diagnosis not present

## 2016-07-08 DIAGNOSIS — N8110 Cystocele, unspecified: Secondary | ICD-10-CM | POA: Diagnosis not present

## 2016-07-08 DIAGNOSIS — N1330 Unspecified hydronephrosis: Secondary | ICD-10-CM | POA: Diagnosis not present

## 2016-07-08 DIAGNOSIS — Z6828 Body mass index (BMI) 28.0-28.9, adult: Secondary | ICD-10-CM | POA: Diagnosis not present

## 2016-07-13 ENCOUNTER — Encounter: Payer: Self-pay | Admitting: Family Medicine

## 2016-07-13 DIAGNOSIS — Z1231 Encounter for screening mammogram for malignant neoplasm of breast: Secondary | ICD-10-CM | POA: Diagnosis not present

## 2016-07-19 DIAGNOSIS — R339 Retention of urine, unspecified: Secondary | ICD-10-CM | POA: Diagnosis not present

## 2016-07-19 DIAGNOSIS — N1330 Unspecified hydronephrosis: Secondary | ICD-10-CM | POA: Diagnosis not present

## 2016-07-19 DIAGNOSIS — R1031 Right lower quadrant pain: Secondary | ICD-10-CM | POA: Diagnosis not present

## 2016-07-19 DIAGNOSIS — N8110 Cystocele, unspecified: Secondary | ICD-10-CM | POA: Diagnosis not present

## 2016-07-25 ENCOUNTER — Ambulatory Visit (HOSPITAL_COMMUNITY)
Admission: RE | Admit: 2016-07-25 | Discharge: 2016-07-25 | Disposition: A | Discharge status: Home / Self Care | Payer: Medicare Other | Source: Ambulatory Visit | Attending: Neurosurgery | Admitting: Neurosurgery

## 2016-07-25 DIAGNOSIS — G9389 Other specified disorders of brain: Secondary | ICD-10-CM | POA: Diagnosis not present

## 2016-07-25 DIAGNOSIS — D496 Neoplasm of unspecified behavior of brain: Secondary | ICD-10-CM | POA: Diagnosis not present

## 2016-07-25 MED ORDER — GADOBENATE DIMEGLUMINE 529 MG/ML IV SOLN
20.0000 mL | Freq: Once | INTRAVENOUS | Status: AC | PRN
  Administered 2016-07-25: 15 mL via INTRAVENOUS

## 2016-07-28 DIAGNOSIS — D496 Neoplasm of unspecified behavior of brain: Secondary | ICD-10-CM | POA: Diagnosis not present

## 2016-08-02 ENCOUNTER — Other Ambulatory Visit: Payer: Self-pay | Admitting: Family Medicine

## 2016-08-08 DIAGNOSIS — R339 Retention of urine, unspecified: Secondary | ICD-10-CM | POA: Diagnosis not present

## 2016-08-08 DIAGNOSIS — N8110 Cystocele, unspecified: Secondary | ICD-10-CM | POA: Diagnosis not present

## 2016-08-08 DIAGNOSIS — Z6827 Body mass index (BMI) 27.0-27.9, adult: Secondary | ICD-10-CM | POA: Diagnosis not present

## 2016-08-31 ENCOUNTER — Encounter: Payer: Self-pay | Admitting: Gastroenterology

## 2016-09-05 ENCOUNTER — Other Ambulatory Visit: Payer: Self-pay | Admitting: Family Medicine

## 2016-09-12 DIAGNOSIS — R339 Retention of urine, unspecified: Secondary | ICD-10-CM | POA: Diagnosis not present

## 2016-09-12 DIAGNOSIS — N8110 Cystocele, unspecified: Secondary | ICD-10-CM | POA: Diagnosis not present

## 2016-09-13 ENCOUNTER — Telehealth: Payer: Self-pay | Admitting: *Deleted

## 2016-09-13 ENCOUNTER — Telehealth: Payer: Self-pay

## 2016-09-13 NOTE — Telephone Encounter (Signed)
I spoke to the patient who said she has an appointment tomorrow morning at 8:45am to remove the pessary.  When I asked if there was anything else she needed, she said no, that was it.

## 2016-09-13 NOTE — Telephone Encounter (Signed)
Received call from patient.   Reports that she had pessary placed on 09/12/2016 at [facility] at [place]. States that she is having pain and pressure in rectum and would like pessary removed. States that she contacted urology office and was advised that no provider in office to remove device. Was advised to contact PCP.   Advised that PCP does not remove pessary. Recommend ED for eval.   MD aware.

## 2016-09-13 NOTE — Telephone Encounter (Signed)
Shari Branch, can you help with this. Maybe give patient another number to try? [facility] is out of the office until 7/13

## 2016-09-13 NOTE — Telephone Encounter (Signed)
[facility] ref pt to [facility].  Pt has a pessary problem.  She tried to call [facility] but no one is in the office there.  Please call.  [phone]

## 2016-09-14 DIAGNOSIS — N8110 Cystocele, unspecified: Secondary | ICD-10-CM | POA: Diagnosis not present

## 2016-09-14 DIAGNOSIS — Z6827 Body mass index (BMI) 27.0-27.9, adult: Secondary | ICD-10-CM | POA: Diagnosis not present

## 2016-09-16 ENCOUNTER — Other Ambulatory Visit: Payer: Self-pay | Admitting: Family Medicine

## 2016-09-28 DIAGNOSIS — Z6827 Body mass index (BMI) 27.0-27.9, adult: Secondary | ICD-10-CM | POA: Diagnosis not present

## 2016-09-28 DIAGNOSIS — N3946 Mixed incontinence: Secondary | ICD-10-CM | POA: Diagnosis not present

## 2016-09-28 DIAGNOSIS — N8110 Cystocele, unspecified: Secondary | ICD-10-CM | POA: Diagnosis not present

## 2016-10-10 ENCOUNTER — Ambulatory Visit: Payer: Medicare Other | Admitting: Family Medicine

## 2016-10-17 ENCOUNTER — Ambulatory Visit (INDEPENDENT_AMBULATORY_CARE_PROVIDER_SITE_OTHER): Payer: Medicare Other | Admitting: Family Medicine

## 2016-10-17 VITALS — BP 130/80 | HR 75 | Temp 98.5°F | Wt 174.0 lb

## 2016-10-17 DIAGNOSIS — E871 Hypo-osmolality and hyponatremia: Secondary | ICD-10-CM | POA: Diagnosis not present

## 2016-10-17 DIAGNOSIS — R432 Parageusia: Secondary | ICD-10-CM

## 2016-10-17 LAB — BASIC METABOLIC PANEL WITH GFR
BUN: 8 mg/dL (ref 7–25)
CHLORIDE: 103 mmol/L (ref 98–110)
CO2: 24 mmol/L (ref 20–32)
Calcium: 9.4 mg/dL (ref 8.6–10.4)
Creat: 0.83 mg/dL (ref 0.60–0.93)
GFR, EST AFRICAN AMERICAN: 81 mL/min (ref >=60)
GFR, EST NON AFRICAN AMERICAN: 71 mL/min (ref >=60)
GLUCOSE: 89 mg/dL (ref 70–99)
POTASSIUM: 4.4 mmol/L (ref 3.5–5.3)
Sodium: 137 mmol/L (ref 135–146)

## 2016-10-17 NOTE — Progress Notes (Signed)
Subjective:    Patient ID: Shari Branch, female    DOB: 1945/03/29, 71 y.o.   MRN: [id number]  HPI  06/2016 Patient has a history of hypothyroidism for which she currently takes levothyroxine. Her last TSH was one year ago. She is due for repeat TSH. She denies any fatigue, heat or cold intolerance, tachycardia, dry mouth, hair loss, or constipation. She is due also for screening cholesterol level. She has a history of nonalcoholic steatohepatitis. She is due to repeat a CMP to monitor her liver function test. She also reports 1-2 weeks of midline low back pain around the level of L4-L5. She denies any radiation into her legs. She denies any symptoms of cauda equina syndrome. She denies any bowel or bladder incontinence area she denies any falls or injury. Mammogram is up-to-date. She is due for Prevnar 13. She's had Pneumovax 23. She refuses a flu shot bone density is not due again until 2019. She does have osteopenia.  At that time, my plan was: Blood pressure today is well controlled. I will screen the patient for hyperlipidemia with a fasting lipid panel. I will check a TSH to ensure that her levothyroxine is appropriately dosed. Patient received Prevnar 62 today in office to update her immunizations. I believe her low back pain is likely due to degenerative disc disease but I will begin by obtaining an x-ray of the lumbar spine  10/17/16 Since I last saw the patient, she has met with a uro-gynecologist. She was diagnosed with urinary retention secondary to bladder prolapse. She was given a pessary. She is also performing 3 times a day self-catheterization. Post residual volumes are usually 200-300 mL. She is contemplating surgery for bladder prolapse. Recently she has developed dysgeusia. She states that everything that she eats has a salt taste to it. 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 There are also no  other explanations for dysgeusia seen on this MRI. There is minimal sinus disease. However recently she's been having multiple issues with her dentition. She is getting dentures. She's having her teeth pulled. This is due to chronic dry mouth and the irritation and rot that it caused. I suspect that this could be causing her dysgeusia.  She believes it was because of her low sodium level in April. At that time BMP revealed a sodium level of 134. I explained to the patient that I did not feel this was the cause of her dysgeusia.   Past Medical History:  Diagnosis Date  . GERD 01/25/2007   Qualifier: Diagnosis of  By: [place] LPN, Maudie Mercury    . Hepatic steatosis   . Hyperlipidemia   . Liver hemangioma   . Osteopenia    6/17 T -2.1 Left hip  . Thyroid disease   . Trigeminal neuralgia 1997   Trigemninal Tumor- Left, [place]   Past Surgical History:  Procedure Laterality Date  .  trigeminal nerve tumor     numbness on left side of face  . ABDOMINAL HYSTERECTOMY    . BREAST REDUCTION SURGERY    . CATARACT EXTRACTION     bilateral  . CHOLECYSTECTOMY    . COLONOSCOPY  2008   Dr. Oneida Alar: poor prep, inadequate exam  . COLONOSCOPY WITH ESOPHAGOGASTRODUODENOSCOPY (EGD)  2009   Dr. Oneida Alar: good prep, normal TI, normal colon, hemorrhoids, gastritis, nodule in cardia (benign)  . EAR PINNA RECONSTRUCTION W/ RIB  GRAFT     7 surgeries starting at age 42  . ESOPHAGOGASTRODUODENOSCOPY  2008   Dr. Oneida Alar: Reflux esophagitis, large hh, gastritis.   Marland Kitchen [name] EXCISION Right 09/19/2013   Procedure: EXCISION [name] FOOT;  Surgeon: Marcheta Grammes, DPM;  Location: [facility];  Service: Podiatry;  Laterality: Right;  . OSTECTOMY Right 09/19/2013   Procedure: OSTECTOMY;  Surgeon: Marcheta Grammes, DPM;  Location: [facility];  Service: Podiatry;  Laterality: Right;  . RETINAL DETACHMENT SURGERY     bilateral  . TONSILLECTOMY     Current Outpatient Prescriptions on File  Prior to Visit  Medication Sig Dispense Refill  . amitriptyline (ELAVIL) 50 MG tablet TAKE FIVE TABLETS BY MOUTH ONCE DAILY AT BEDTIME 150 tablet 3  . escitalopram (LEXAPRO) 20 MG tablet TAKE 1 TABLET BY MOUTH ONCE DAILY 90 tablet 3  . levothyroxine (SYNTHROID, LEVOTHROID) 112 MCG tablet TAKE ONE TABLET BY MOUTH ONCE DAILY 30 tablet 11  . pantoprazole (PROTONIX) 40 MG tablet TAKE ONE TABLET BY MOUTH TWICE DAILY 180 tablet 3  . albuterol (PROVENTIL HFA;VENTOLIN HFA) 108 (90 Base) MCG/ACT inhaler Inhale 2 puffs into the lungs every 6 (six) hours as needed for wheezing or shortness of breath. (Patient not taking: Reported on 10/17/2016) 1 Inhaler 2  . atorvastatin (LIPITOR) 20 MG tablet Take 1 tablet (20 mg total) by mouth daily. (Patient not taking: Reported on 10/17/2016) 90 tablet 3  . naproxen (NAPROSYN) 500 MG tablet Take 1 tablet (500 mg total) by mouth 2 (two) times daily with a meal. (Patient not taking: Reported on 10/17/2016) 60 tablet 2   No current facility-administered medications on file prior to visit.    Allergies  Allergen Reactions  . Sulfonamide Derivatives     REACTION: Joint Pain and locking - walks like a 71 year old   Social History   Social History  . Marital status: Married    Spouse name: N/A  . Number of children: 3  . Years of education: N/A   Occupational History  . PT - [employer]    Social History Main Topics  . Smoking status: Current Every Day Smoker    Packs/day: 1.50  . Smokeless tobacco: Never Used  . Alcohol use No  . Drug use: No  . Sexual activity: Not on file   Other Topics Concern  . Not on file   Social History Narrative  . No narrative on file      Review of Systems  All other systems reviewed and are negative.      Objective:   Physical Exam  Constitutional: She appears well-developed and well-nourished. No distress.  Neck: No JVD present. No thyromegaly present.  Cardiovascular: Normal rate, regular rhythm and normal heart  sounds.  Exam reveals no gallop.   No murmur heard. Pulmonary/Chest: Effort normal and breath sounds normal. No respiratory distress. She has no wheezes. She has no rales.  Abdominal: Soft. Bowel sounds are normal. She exhibits no distension. There is no tenderness. There is no rebound.  Musculoskeletal:       Lumbar back: She exhibits normal range of motion, no tenderness and no bony tenderness.  Lymphadenopathy:    She has no cervical adenopathy.  Skin: She is not diaphoretic.  Vitals reviewed.         Assessment & Plan:  Hyponatremia - Plan: BASIC METABOLIC PANEL WITH GFR  Dysgeusia Patient would like me to recheck her sodium level which I'm happy to do. However I believe  that her "salty" dysgeusia be related more to her chronic gingivitis/dental issues. I am reassured by the recent stable MRI in May.  As an aside, she also reports hypersomnolence. She denies any snoring or symptoms of sleep apnea other than hypersomnolence. She is taking high-dose amitriptyline for trigeminal neuralgia. She will try decreasing the dose of amitriptyline and if her hypersomnolence does not improve, I would refer the patient for a sleep study

## 2016-11-21 DIAGNOSIS — L438 Other lichen planus: Secondary | ICD-10-CM | POA: Diagnosis not present

## 2016-11-23 DIAGNOSIS — R339 Retention of urine, unspecified: Secondary | ICD-10-CM | POA: Diagnosis not present

## 2016-11-23 DIAGNOSIS — Z6827 Body mass index (BMI) 27.0-27.9, adult: Secondary | ICD-10-CM | POA: Diagnosis not present

## 2016-11-23 DIAGNOSIS — N3946 Mixed incontinence: Secondary | ICD-10-CM | POA: Diagnosis not present

## 2016-11-23 DIAGNOSIS — Z4689 Encounter for fitting and adjustment of other specified devices: Secondary | ICD-10-CM | POA: Diagnosis not present

## 2017-03-10 DIAGNOSIS — Z4689 Encounter for fitting and adjustment of other specified devices: Secondary | ICD-10-CM | POA: Diagnosis not present

## 2017-03-10 DIAGNOSIS — N8110 Cystocele, unspecified: Secondary | ICD-10-CM | POA: Diagnosis not present

## 2017-03-10 DIAGNOSIS — Z6827 Body mass index (BMI) 27.0-27.9, adult: Secondary | ICD-10-CM | POA: Diagnosis not present

## 2017-03-16 ENCOUNTER — Other Ambulatory Visit: Payer: Self-pay | Admitting: Family Medicine

## 2017-03-24 ENCOUNTER — Other Ambulatory Visit: Payer: Self-pay | Admitting: Family Medicine

## 2017-04-25 ENCOUNTER — Ambulatory Visit (INDEPENDENT_AMBULATORY_CARE_PROVIDER_SITE_OTHER): Payer: Medicare Other | Admitting: Family Medicine

## 2017-04-25 ENCOUNTER — Encounter: Payer: Self-pay | Admitting: Family Medicine

## 2017-04-25 VITALS — BP 140/80 | HR 97 | Temp 98.1°F | Resp 18 | Ht 68.0 in | Wt 176.0 lb

## 2017-04-25 DIAGNOSIS — R05 Cough: Secondary | ICD-10-CM

## 2017-04-25 DIAGNOSIS — R059 Cough, unspecified: Secondary | ICD-10-CM

## 2017-04-25 MED ORDER — HYDROCODONE-HOMATROPINE 5-1.5 MG/5ML PO SYRP: 5.0000 mL | ORAL_SOLUTION | Freq: Three times a day (TID) | ORAL | 0 refills | Status: DC | PRN

## 2017-04-25 NOTE — Progress Notes (Signed)
Subjective:    Patient ID: Shari Branch, female    DOB: 1945/07/03, 72 y.o.   MRN: [id number]  HPI  3 weeks ago, the patient developed an upper respiratory infection.  Symptoms included rhinorrhea, sore throat, cough productive of yellow sputum, and subjective fevers.  All of these symptoms have improved except for the cough.  She continues to have a dry cough that is nonproductive.  She denies any hemoptysis.  She denies any shortness of breath.  She denies any pleurisy fever.  She denies any wheezing.  She does continue to smoke.  She denies any postnasal drip.  She denies any acid reflux. Past Medical History:  Diagnosis Date  . GERD 01/25/2007   Qualifier: Diagnosis of  By: [place] LPN, Maudie Mercury    . Hepatic steatosis   . Hyperlipidemia   . Liver hemangioma   . Osteopenia    6/17 T -2.1 Left hip  . Thyroid disease   . Trigeminal neuralgia 1997   Trigemninal Tumor- Left, [place]   Past Surgical History:  Procedure Laterality Date  .  trigeminal nerve tumor     numbness on left side of face  . ABDOMINAL HYSTERECTOMY    . BREAST REDUCTION SURGERY    . CATARACT EXTRACTION     bilateral  . CHOLECYSTECTOMY    . COLONOSCOPY  2008   Dr. Oneida Alar: poor prep, inadequate exam  . COLONOSCOPY WITH ESOPHAGOGASTRODUODENOSCOPY (EGD)  2009   Dr. Oneida Alar: good prep, normal TI, normal colon, hemorrhoids, gastritis, nodule in cardia (benign)  . EAR PINNA RECONSTRUCTION W/ RIB GRAFT     7 surgeries starting at age 31  . ESOPHAGOGASTRODUODENOSCOPY  2008   Dr. Oneida Alar: Reflux esophagitis, large hh, gastritis.   Marland Kitchen [name] EXCISION Right 09/19/2013   Procedure: EXCISION [name] FOOT;  Surgeon: Marcheta Grammes, DPM;  Location: [facility];  Service: Podiatry;  Laterality: Right;  . OSTECTOMY Right 09/19/2013   Procedure: OSTECTOMY;  Surgeon: Marcheta Grammes, DPM;  Location: [facility];  Service: Podiatry;  Laterality: Right;  . RETINAL DETACHMENT SURGERY     bilateral  . TONSILLECTOMY     Current Outpatient Medications on File Prior to Visit  Medication Sig Dispense Refill  . amitriptyline (ELAVIL) 50 MG tablet TAKE 5 TABLETS BY MOUTH ONCE DAILY AT BEDTIME 150 tablet 3  . atorvastatin (LIPITOR) 20 MG tablet Take 1 tablet (20 mg total) by mouth daily. 90 tablet 3  . escitalopram (LEXAPRO) 20 MG tablet TAKE 1 TABLET BY MOUTH ONCE DAILY 90 tablet 3  . levothyroxine (SYNTHROID, LEVOTHROID) 112 MCG tablet TAKE ONE TABLET BY MOUTH ONCE DAILY 30 tablet 11  . naproxen (NAPROSYN) 500 MG tablet Take 1 tablet (500 mg total) by mouth 2 (two) times daily with a meal. 60 tablet 2  . pantoprazole (PROTONIX) 40 MG tablet TAKE ONE TABLET BY MOUTH TWICE DAILY 180 tablet 3  . albuterol (PROVENTIL HFA;VENTOLIN HFA) 108 (90 Base) MCG/ACT inhaler Inhale 2 puffs into the lungs every 6 (six) hours as needed for wheezing or shortness of breath. (Patient not taking: Reported on 04/25/2017) 1 Inhaler 2   No current facility-administered medications on file prior to visit.    Allergies  Allergen Reactions  . Sulfonamide Derivatives     REACTION: Joint Pain and locking - walks like a 72 year old   Social History   Socioeconomic History  . Marital status: Married    Spouse name: Not on file  . Number of children:  3  . Years of education: Not on file  . Highest education level: Not on file  Social Needs  . Financial resource strain: Not on file  . Food insecurity - worry: Not on file  . Food insecurity - inability: Not on file  . Transportation needs - medical: Not on file  . Transportation needs - non-medical: Not on file  Occupational History  . Occupation: PT - [employer]  Tobacco Use  . Smoking status: Current Every Day Smoker    Packs/day: 1.50  . Smokeless tobacco: Never Used  Substance and Sexual Activity  . Alcohol use: No  . Drug use: No  . Sexual activity: Not on file  Other Topics Concern  . Not on file  Social History Narrative  . Not on file       Review of Systems  All other systems reviewed and are negative.      Objective:   Physical Exam  Constitutional: She appears well-developed and well-nourished. No distress.  Neck: No JVD present. No thyromegaly present.  Cardiovascular: Normal rate, regular rhythm and normal heart sounds. Exam reveals no gallop.  No murmur heard. Pulmonary/Chest: Effort normal and breath sounds normal. No respiratory distress. She has no wheezes. She has no rales.  Abdominal: Soft. Bowel sounds are normal. She exhibits no distension. There is no tenderness. There is no rebound.  Musculoskeletal:       Lumbar back: She exhibits normal range of motion, no tenderness and no bony tenderness.  Lymphadenopathy:    She has no cervical adenopathy.  Skin: She is not diaphoretic.  Vitals reviewed.         Assessment & Plan:  Cough I believe the cough is just the residual sequela of her underlying respiratory infection that has subsided.  The prolonged cough is likely also partly due to her continued smoking.  Therefore I recommended symptomatic treatment with Hycodan 1 teaspoon every 8 hours as needed for cough and tincture of time for 7 days.  If cough is no better at that point, I would recommend a chest x-ray.  Certainly recheck immediately if she develops fever, purulent sputum, or shortness of breath

## 2017-05-08 ENCOUNTER — Other Ambulatory Visit: Payer: Self-pay | Admitting: Family Medicine

## 2017-05-08 MED ORDER — HYDROCODONE-HOMATROPINE 5-1.5 MG/5ML PO SYRP: 5.0000 mL | ORAL_SOLUTION | Freq: Three times a day (TID) | ORAL | 0 refills | Status: DC | PRN

## 2017-05-08 NOTE — Telephone Encounter (Signed)
Requesting refill    Hycodan  LOV:  04/25/17 LRF:  04/25/17

## 2017-08-15 ENCOUNTER — Other Ambulatory Visit: Payer: Self-pay | Admitting: Family Medicine

## 2017-09-29 ENCOUNTER — Other Ambulatory Visit: Payer: Self-pay | Admitting: Family Medicine

## 2017-09-29 ENCOUNTER — Ambulatory Visit: Payer: Medicare Other | Admitting: Family Medicine

## 2017-10-04 ENCOUNTER — Encounter: Payer: Self-pay | Admitting: Family Medicine

## 2017-10-06 ENCOUNTER — Ambulatory Visit (INDEPENDENT_AMBULATORY_CARE_PROVIDER_SITE_OTHER): Payer: Medicare Other | Admitting: Family Medicine

## 2017-10-06 ENCOUNTER — Encounter: Payer: Self-pay | Admitting: Family Medicine

## 2017-10-06 VITALS — BP 110/66 | HR 88 | Temp 98.0°F | Resp 14 | Ht 68.0 in | Wt 168.0 lb

## 2017-10-06 DIAGNOSIS — E039 Hypothyroidism, unspecified: Secondary | ICD-10-CM

## 2017-10-06 DIAGNOSIS — R5382 Chronic fatigue, unspecified: Secondary | ICD-10-CM

## 2017-10-06 DIAGNOSIS — R0689 Other abnormalities of breathing: Secondary | ICD-10-CM

## 2017-10-06 DIAGNOSIS — K760 Fatty (change of) liver, not elsewhere classified: Secondary | ICD-10-CM | POA: Diagnosis not present

## 2017-10-06 DIAGNOSIS — Z1211 Encounter for screening for malignant neoplasm of colon: Secondary | ICD-10-CM | POA: Diagnosis not present

## 2017-10-06 DIAGNOSIS — R6889 Other general symptoms and signs: Secondary | ICD-10-CM | POA: Diagnosis not present

## 2017-10-06 NOTE — Progress Notes (Signed)
Subjective:    Patient ID: Shari Branch, female    DOB: [birth year], 72 y.o.   MRN: [id number]  HPI  Patient is here today at our request because she is overdue for lab work to evaluate her hypothyroidism.  She is currently on levothyroxine 112 mcg p.o. daily for treatment of this.  She does report chronic fatigue.  She states that she has no energy.  She gets tired very easily.  She denies any hypersomnia.  She is requesting that we also check her for B12 deficiency.  She is overdue for lab work to monitor her hepatic steatosis.  On an MRI in 2016 to follow-up what appear to be benign hepatic adenomas, she was found to have moderate to severe hepatic steatosis.  See results dictated below.  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  Underlying severe hepatic steatosis.  She demonstrates no evidence of jaundice.  She denies any itching.  She denies any episodes of encephalopathy.  She denies any bleeding or bruising.  She is questioning if she needs to see a hepatologist to follow-up on the adenomatous.  I explained to her that on the most recent MRI these lesions were stable and slightly improved and therefore favored to reflect benign hepatic adenomas which do not require further follow-up.  We may need to obtain her right upper quadrant ultrasound however if there is evidence of liver dysfunction on her lab work such as an elevated INR, elevated liver function test, elevated bilirubin, or low albumin.  Her physical exam today, the patient demonstrates bibasilar crackles without explanation.  She denies any cough or shortness of breath.  These have not been present previously on exam.  Her only review of systems complaint is arthritis and pain in the joints of her left third and fourth MCP joints as well as a rash on her legs that consist of several areas of erythema with fine white scale and a circular patch that  appear to be either nummular eczema versus guttate psoriasis.  There are 3 such patches on her right knee.  There is also one patch on her lower left leg.  She is overdue for mammogram however she prefers to schedule this herself.  She is also overdue for colon cancer screening.  She refuses a colonoscopy but she will consent to a Cologuard Past Medical History:  Diagnosis Date  . GERD 01/25/2007   Qualifier: Diagnosis of  By: [place] LPN, Maudie Mercury    . Hepatic steatosis   . Hyperlipidemia   . Liver hemangioma   . Osteopenia    6/17 T -2.1 Left hip  . Thyroid disease   . Trigeminal neuralgia 1997   Trigemninal Tumor- Left, [place]   Past Surgical History:  Procedure Laterality Date  .  trigeminal nerve tumor     numbness on left side of face  . ABDOMINAL HYSTERECTOMY    . BREAST REDUCTION SURGERY    . CATARACT EXTRACTION     bilateral  . CHOLECYSTECTOMY    . COLONOSCOPY  2008   Dr. Oneida Alar: poor prep, inadequate exam  . COLONOSCOPY WITH ESOPHAGOGASTRODUODENOSCOPY (EGD)  2009   Dr. Oneida Alar: good prep, normal TI, normal colon, hemorrhoids, gastritis, nodule in cardia (benign)  . EAR PINNA RECONSTRUCTION W/ RIB GRAFT     7 surgeries starting at age 55  . ESOPHAGOGASTRODUODENOSCOPY  2008   Dr. Oneida Alar: Reflux esophagitis, large hh, gastritis.   Marland Kitchen GANGLION  CYST EXCISION Right 09/19/2013   Procedure: EXCISION [name] FOOT;  Surgeon: Marcheta Grammes, DPM;  Location: [facility];  Service: Podiatry;  Laterality: Right;  . OSTECTOMY Right 09/19/2013   Procedure: OSTECTOMY;  Surgeon: Marcheta Grammes, DPM;  Location: [facility];  Service: Podiatry;  Laterality: Right;  . RETINAL DETACHMENT SURGERY     bilateral  . TONSILLECTOMY     Current Outpatient Medications on File Prior to Visit  Medication Sig Dispense Refill  . amitriptyline (ELAVIL) 50 MG tablet TAKE 5 TABLETS BY MOUTH ONCE DAILY AT BEDTIME 150 tablet 3  . atorvastatin (LIPITOR) 20 MG tablet Take 1  tablet (20 mg total) by mouth daily. 90 tablet 3  . escitalopram (LEXAPRO) 20 MG tablet TAKE 1 TABLET BY MOUTH ONCE DAILY 90 tablet 3  . levothyroxine (SYNTHROID, LEVOTHROID) 112 MCG tablet TAKE 1 TABLET BY MOUTH ONCE DAILY 30 tablet 11  . naproxen (NAPROSYN) 500 MG tablet Take 1 tablet (500 mg total) by mouth 2 (two) times daily with a meal. 60 tablet 2  . pantoprazole (PROTONIX) 40 MG tablet TAKE ONE TABLET BY MOUTH TWICE DAILY 180 tablet 3   No current facility-administered medications on file prior to visit.    Allergies  Allergen Reactions  . Sulfonamide Derivatives     REACTION: Joint Pain and locking - walks like a 72 year old   Social History   Socioeconomic History  . Marital status: Married    Spouse name: Not on file  . Number of children: 3  . Years of education: Not on file  . Highest education level: Not on file  Occupational History  . Occupation: PT - [employer]  Social Needs  . Financial resource strain: Not on file  . Food insecurity:    Worry: Not on file    Inability: Not on file  . Transportation needs:    Medical: Not on file    Non-medical: Not on file  Tobacco Use  . Smoking status: Current Every Day Smoker    Packs/day: 1.50  . Smokeless tobacco: Never Used  Substance and Sexual Activity  . Alcohol use: No  . Drug use: No  . Sexual activity: Not on file  Lifestyle  . Physical activity:    Days per week: Not on file    Minutes per session: Not on file  . Stress: Not on file  Relationships  . Social connections:    Talks on phone: Not on file    Gets together: Not on file    Attends religious service: Not on file    Active member of club or organization: Not on file    Attends meetings of clubs or organizations: Not on file    Relationship status: Not on file  . Intimate partner violence:    Fear of current or ex partner: Not on file    Emotionally abused: Not on file    Physically abused: Not on file    Forced sexual activity: Not on file    Other Topics Concern  . Not on file  Social History Narrative  . Not on file      Review of Systems  All other systems reviewed and are negative.      Objective:   Physical Exam  Constitutional: She appears well-developed and well-nourished. No distress.  Neck: No JVD present. No thyromegaly present.  Cardiovascular: Normal rate, regular rhythm and normal heart sounds. Exam reveals no gallop.  No murmur heard. Pulmonary/Chest: Effort normal. No  respiratory distress. She has no wheezes. She has rales in the right lower field and the left lower field.  Abdominal: Soft. Bowel sounds are normal. She exhibits no distension. There is no tenderness. There is no rebound.  Musculoskeletal:       Lumbar back: She exhibits normal range of motion, no tenderness and no bony tenderness.  Lymphadenopathy:    She has no cervical adenopathy.  Skin: She is not diaphoretic.  Vitals reviewed.         Assessment & Plan:  Abnormal breath sounds - Plan: DG Chest 2 View  Chronic fatigue - Plan: CBC with Differential/Platelet, COMPLETE METABOLIC PANEL WITH GFR, Vitamin B12, TSH  Hepatic steatosis - Plan: PT with INR/Fingerstick, Protime-INR  Hypothyroidism, unspecified type  Colon cancer screening Given the abnormal breath sounds, I asked the patient to get a chest x-ray to evaluate further.  Otherwise she is relatively asymptomatic.  I hope that this simply represents atelectasis or scar.  I will check a TSH today given her history of hypothyroidism.  Given her chronic fatigue, I will also check a CBC along with a CMP and vitamin B12 level.  Given her history of hepatic steatosis, the patient is asymptomatic however I will check a PT/INR, liver function test, bilirubin, as well as an albumin to assess for any liver dysfunction.  If there is evidence of underlying liver dysfunction, I would likely repeat a right upper quadrant ultrasound to evaluate for underlying cirrhosis.  The results of this  may dictate whether or not we perform a gastroenterology consultation.  She refuses a colonoscopy but will consent to a Cologuard.  She states that she will gradual her own mammogram.

## 2017-10-07 LAB — PROTIME-INR
INR: 1
Prothrombin Time: 10.1 s (ref 9.0–11.5)

## 2017-10-07 LAB — CBC WITH DIFFERENTIAL/PLATELET
BASOS PCT: 1 %
Basophils Absolute: 50 cells/uL (ref 0–200)
EOS PCT: 2 %
Eosinophils Absolute: 100 cells/uL (ref 15–500)
HCT: 37.9 % (ref 35.0–45.0)
Hemoglobin: 12.8 g/dL (ref 11.7–15.5)
Lymphs Abs: 1635 cells/uL (ref 850–3900)
MCH: 31.1 pg (ref 27.0–33.0)
MCHC: 33.8 g/dL (ref 32.0–36.0)
MCV: 92 fL (ref 80.0–100.0)
MONOS PCT: 4.6 %
MPV: 10.5 fL (ref 7.5–12.5)
Neutro Abs: 2985 cells/uL (ref 1500–7800)
Neutrophils Relative %: 59.7 %
PLATELETS: 170 10*3/uL (ref 140–400)
RBC: 4.12 10*6/uL (ref 3.80–5.10)
RDW: 12.6 % (ref 11.0–15.0)
TOTAL LYMPHOCYTE: 32.7 %
WBC: 5 10*3/uL (ref 3.8–10.8)
WBCMIX: 230 {cells}/uL (ref 200–950)

## 2017-10-07 LAB — COMPLETE METABOLIC PANEL WITH GFR
AG RATIO: 2 (calc) (ref 1.0–2.5)
ALBUMIN MSPROF: 4.3 g/dL (ref 3.6–5.1)
ALT: 22 U/L (ref 6–29)
AST: 34 U/L (ref 10–35)
Alkaline phosphatase (APISO): 125 U/L (ref 33–130)
BUN: 9 mg/dL (ref 7–25)
CALCIUM: 9.7 mg/dL (ref 8.6–10.4)
CO2: 26 mmol/L (ref 20–32)
Chloride: 105 mmol/L (ref 98–110)
Creat: 0.79 mg/dL (ref 0.60–0.93)
GFR, EST AFRICAN AMERICAN: 86 mL/min/{1.73_m2} (ref >=60)
GFR, Est Non African American: 74 mL/min/{1.73_m2} (ref >=60)
GLOBULIN: 2.2 g/dL (ref 1.9–3.7)
Glucose, Bld: 96 mg/dL (ref 65–99)
POTASSIUM: 4.9 mmol/L (ref 3.5–5.3)
SODIUM: 139 mmol/L (ref 135–146)
TOTAL PROTEIN: 6.5 g/dL (ref 6.1–8.1)
Total Bilirubin: 0.5 mg/dL (ref 0.2–1.2)

## 2017-10-07 LAB — TSH: TSH: 1.69 mIU/L (ref 0.40–4.50)

## 2017-10-07 LAB — VITAMIN B12: Vitamin B-12: 339 pg/mL (ref 200–1100)

## 2017-10-09 ENCOUNTER — Other Ambulatory Visit: Payer: Self-pay | Admitting: Family Medicine

## 2017-10-09 ENCOUNTER — Ambulatory Visit (HOSPITAL_COMMUNITY)
Admission: RE | Admit: 2017-10-09 | Discharge: 2017-10-09 | Disposition: A | Discharge status: Home / Self Care | Payer: Medicare Other | Source: Ambulatory Visit | Attending: Family Medicine | Admitting: Family Medicine

## 2017-10-09 DIAGNOSIS — J189 Pneumonia, unspecified organism: Secondary | ICD-10-CM

## 2017-10-09 DIAGNOSIS — R0689 Other abnormalities of breathing: Secondary | ICD-10-CM

## 2017-10-09 MED ORDER — LEVOFLOXACIN 500 MG PO TABS
500.0000 mg | ORAL_TABLET | Freq: Every day | ORAL | 0 refills | Status: DC
Start: 2017-10-09 — End: 2017-10-27

## 2017-10-13 ENCOUNTER — Other Ambulatory Visit: Payer: Self-pay | Admitting: Family Medicine

## 2017-10-20 ENCOUNTER — Encounter: Payer: Self-pay | Admitting: Family Medicine

## 2017-10-25 DIAGNOSIS — Z6826 Body mass index (BMI) 26.0-26.9, adult: Secondary | ICD-10-CM | POA: Diagnosis not present

## 2017-10-25 DIAGNOSIS — Z4689 Encounter for fitting and adjustment of other specified devices: Secondary | ICD-10-CM | POA: Diagnosis not present

## 2017-10-27 ENCOUNTER — Other Ambulatory Visit: Payer: Self-pay | Admitting: Family Medicine

## 2017-10-27 DIAGNOSIS — J189 Pneumonia, unspecified organism: Secondary | ICD-10-CM

## 2017-10-27 MED ORDER — LEVOFLOXACIN 500 MG PO TABS: 500.0000 mg | ORAL_TABLET | Freq: Every day | ORAL | 0 refills | Status: DC

## 2017-11-13 ENCOUNTER — Ambulatory Visit (INDEPENDENT_AMBULATORY_CARE_PROVIDER_SITE_OTHER): Payer: Medicare Other | Admitting: Family Medicine

## 2017-11-13 ENCOUNTER — Encounter: Payer: Self-pay | Admitting: Family Medicine

## 2017-11-13 VITALS — BP 100/70 | HR 88 | Temp 98.5°F | Resp 20 | Ht 68.0 in | Wt 170.0 lb

## 2017-11-13 DIAGNOSIS — R0689 Other abnormalities of breathing: Secondary | ICD-10-CM | POA: Diagnosis not present

## 2017-11-13 DIAGNOSIS — Z23 Encounter for immunization: Secondary | ICD-10-CM | POA: Diagnosis not present

## 2017-11-13 NOTE — Addendum Note (Signed)
Addended by: Shary Decamp B on: 11/13/2017 04:11 PM   Modules accepted: Orders

## 2017-11-13 NOTE — Progress Notes (Signed)
Subjective:    Patient ID: Shari Branch, female    DOB: 1946/02/11, 72 y.o.   MRN: [id number]  Medication Refill    10/06/17 Patient is here today at our request because she is overdue for lab work to evaluate her hypothyroidism.  She is currently on levothyroxine 112 mcg p.o. daily for treatment of this.  She does report chronic fatigue.  She states that she has no energy.  She gets tired very easily.  She denies any hypersomnia.  She is requesting that we also check her for B12 deficiency.  She is overdue for lab work to monitor her hepatic steatosis.  On an MRI in 2016 to follow-up what appear to be benign hepatic adenomas, she was found to have moderate to severe hepatic steatosis.  See results dictated below.  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  Underlying severe hepatic steatosis.  She demonstrates no evidence of jaundice.  She denies any itching.  She denies any episodes of encephalopathy.  She denies any bleeding or bruising.  She is questioning if she needs to see a hepatologist to follow-up on the adenomatous.  I explained to her that on the most recent MRI these lesions were stable and slightly improved and therefore favored to reflect benign hepatic adenomas which do not require further follow-up.  We may need to obtain her right upper quadrant ultrasound however if there is evidence of liver dysfunction on her lab work such as an elevated INR, elevated liver function test, elevated bilirubin, or low albumin.  Her physical exam today, the patient demonstrates bibasilar crackles without explanation.  She denies any cough or shortness of breath.  These have not been present previously on exam.  Her only review of systems complaint is arthritis and pain in the joints of her left third and fourth MCP joints as well as a rash on her legs that consist of several areas of erythema with fine white scale and  a circular patch that appear to be either nummular eczema versus guttate psoriasis.  There are 3 such patches on her right knee.  There is also one patch on her lower left leg.  She is overdue for mammogram however she prefers to schedule this herself.  She is also overdue for colon cancer screening.  She refuses a colonoscopy but she will consent to a Cologuard.  At that time, my plan was: Given the abnormal breath sounds, I asked the patient to get a chest x-ray to evaluate further.  Otherwise she is relatively asymptomatic.  I hope that this simply represents atelectasis or scar.  I will check a TSH today given her history of hypothyroidism.  Given her chronic fatigue, I will also check a CBC along with a CMP and vitamin B12 level.  Given her history of hepatic steatosis, the patient is asymptomatic however I will check a PT/INR, liver function test, bilirubin, as well as an albumin to assess for any liver dysfunction.  If there is evidence of underlying liver dysfunction, I would likely repeat a right upper quadrant ultrasound to evaluate for underlying cirrhosis.  The results of this may dictate whether or not we perform a gastroenterology consultation.  She refuses a colonoscopy but will consent to a Cologuard.  She states that she will gradual her own mammogram.  11/13/17 Chest x-ray revealed patchy left basilar pneumonia.  Patient was started on Levaquin 500 mg a day for 7 days and was  instructed to repeat a chest x-ray in 4 weeks.  We had a difficult time reaching the patient for several weeks.  She completed antibiotics approximately 2 weeks ago.  Clinically she does not appear to have pneumonia.  She never did.  She denies any cough.  She denies any shortness of breath.  She denies any fever.  She denies any chest pain.  Clinically the patient continues to have abnormal bibasilar breath sounds.  She has crackles and rhonchi bilaterally.  This is faint but still present.  She continues to smoke. Past  Medical History:  Diagnosis Date  . GERD 01/25/2007   Qualifier: Diagnosis of  By: [place] LPN, Maudie Mercury    . Hepatic steatosis   . Hyperlipidemia   . Liver hemangioma   . Osteopenia    6/17 T -2.1 Left hip  . Thyroid disease   . Trigeminal neuralgia 1997   Trigemninal Tumor- Left, [place]   Past Surgical History:  Procedure Laterality Date  .  trigeminal nerve tumor     numbness on left side of face  . ABDOMINAL HYSTERECTOMY    . BREAST REDUCTION SURGERY    . CATARACT EXTRACTION     bilateral  . CHOLECYSTECTOMY    . COLONOSCOPY  2008   Dr. Oneida Alar: poor prep, inadequate exam  . COLONOSCOPY WITH ESOPHAGOGASTRODUODENOSCOPY (EGD)  2009   Dr. Oneida Alar: good prep, normal TI, normal colon, hemorrhoids, gastritis, nodule in cardia (benign)  . EAR PINNA RECONSTRUCTION W/ RIB GRAFT     7 surgeries starting at age 89  . ESOPHAGOGASTRODUODENOSCOPY  2008   Dr. Oneida Alar: Reflux esophagitis, large hh, gastritis.   Marland Kitchen [name] EXCISION Right 09/19/2013   Procedure: EXCISION [name] FOOT;  Surgeon: Marcheta Grammes, DPM;  Location: [facility];  Service: Podiatry;  Laterality: Right;  . OSTECTOMY Right 09/19/2013   Procedure: OSTECTOMY;  Surgeon: Marcheta Grammes, DPM;  Location: [facility];  Service: Podiatry;  Laterality: Right;  . RETINAL DETACHMENT SURGERY     bilateral  . TONSILLECTOMY     Current Outpatient Medications on File Prior to Visit  Medication Sig Dispense Refill  . amitriptyline (ELAVIL) 50 MG tablet TAKE 5 TABLETS BY MOUTH AT BEDTIME 150 tablet 3  . atorvastatin (LIPITOR) 20 MG tablet Take 1 tablet (20 mg total) by mouth daily. 90 tablet 3  . escitalopram (LEXAPRO) 20 MG tablet TAKE 1 TABLET BY MOUTH ONCE DAILY 90 tablet 3  . levothyroxine (SYNTHROID, LEVOTHROID) 112 MCG tablet TAKE 1 TABLET BY MOUTH ONCE DAILY 30 tablet 11  . naproxen (NAPROSYN) 500 MG tablet Take 1 tablet (500 mg total) by mouth 2 (two) times daily with a meal. 60 tablet 2  .  pantoprazole (PROTONIX) 40 MG tablet TAKE ONE TABLET BY MOUTH TWICE DAILY 180 tablet 3   No current facility-administered medications on file prior to visit.    Allergies  Allergen Reactions  . Sulfonamide Derivatives     REACTION: Joint Pain and locking - walks like a 72 year old   Social History   Socioeconomic History  . Marital status: Married    Spouse name: Not on file  . Number of children: 3  . Years of education: Not on file  . Highest education level: Not on file  Occupational History  . Occupation: PT - [employer]  Social Needs  . Financial resource strain: Not on file  . Food insecurity:    Worry: Not on file    Inability: Not on  file  . Transportation needs:    Medical: Not on file    Non-medical: Not on file  Tobacco Use  . Smoking status: Current Every Day Smoker    Packs/day: 1.50  . Smokeless tobacco: Never Used  Substance and Sexual Activity  . Alcohol use: No  . Drug use: No  . Sexual activity: Not on file  Lifestyle  . Physical activity:    Days per week: Not on file    Minutes per session: Not on file  . Stress: Not on file  Relationships  . Social connections:    Talks on phone: Not on file    Gets together: Not on file    Attends religious service: Not on file    Active member of club or organization: Not on file    Attends meetings of clubs or organizations: Not on file    Relationship status: Not on file  . Intimate partner violence:    Fear of current or ex partner: Not on file    Emotionally abused: Not on file    Physically abused: Not on file    Forced sexual activity: Not on file  Other Topics Concern  . Not on file  Social History Narrative  . Not on file      Review of Systems  All other systems reviewed and are negative.      Objective:   Physical Exam  Constitutional: She appears well-developed and well-nourished. No distress.  Neck: No JVD present. No thyromegaly present.  Cardiovascular: Normal rate, regular rhythm  and normal heart sounds. Exam reveals no gallop.  No murmur heard. Pulmonary/Chest: Effort normal. No respiratory distress. She has no wheezes. She has rales in the right lower field and the left lower field.  Abdominal: Soft. Bowel sounds are normal. She exhibits no distension. There is no tenderness. There is no rebound.  Musculoskeletal:       Lumbar back: She exhibits normal range of motion, no tenderness and no bony tenderness.  Lymphadenopathy:    She has no cervical adenopathy.  Skin: She is not diaphoretic.  Vitals reviewed.         Assessment & Plan:  Abnormal breath sounds Clinically the patient does not have pneumonia.  She has no cough fever chest pain or shortness of breath.  Her exam reveals the same abnormal breath sounds that I appreciated at her last visit.  Therefore I would repeat chest x-ray and if the left basilar opacity presents, I would recommend a CT scan of the lungs to rule out malignancy.

## 2017-11-14 ENCOUNTER — Ambulatory Visit (HOSPITAL_COMMUNITY)
Admission: RE | Admit: 2017-11-14 | Discharge: 2017-11-14 | Disposition: A | Discharge status: Home / Self Care | Payer: Medicare Other | Source: Ambulatory Visit | Attending: Family Medicine | Admitting: Family Medicine

## 2017-11-14 DIAGNOSIS — J189 Pneumonia, unspecified organism: Secondary | ICD-10-CM | POA: Diagnosis not present

## 2017-11-14 DIAGNOSIS — Z09 Encounter for follow-up examination after completed treatment for conditions other than malignant neoplasm: Secondary | ICD-10-CM | POA: Diagnosis not present

## 2017-11-14 DIAGNOSIS — R0689 Other abnormalities of breathing: Secondary | ICD-10-CM | POA: Diagnosis not present

## 2017-12-06 ENCOUNTER — Other Ambulatory Visit: Payer: Self-pay | Admitting: Family Medicine

## 2018-02-05 DIAGNOSIS — Z4689 Encounter for fitting and adjustment of other specified devices: Secondary | ICD-10-CM | POA: Diagnosis not present

## 2018-02-05 DIAGNOSIS — Z6825 Body mass index (BMI) 25.0-25.9, adult: Secondary | ICD-10-CM | POA: Diagnosis not present

## 2018-04-02 ENCOUNTER — Ambulatory Visit (INDEPENDENT_AMBULATORY_CARE_PROVIDER_SITE_OTHER): Payer: Medicare Other | Admitting: Family Medicine

## 2018-04-02 ENCOUNTER — Encounter: Payer: Self-pay | Admitting: Family Medicine

## 2018-04-02 VITALS — BP 110/68 | HR 80 | Temp 98.3°F | Resp 16 | Ht 68.0 in | Wt 169.0 lb

## 2018-04-02 DIAGNOSIS — R0781 Pleurodynia: Secondary | ICD-10-CM

## 2018-04-02 DIAGNOSIS — E039 Hypothyroidism, unspecified: Secondary | ICD-10-CM | POA: Diagnosis not present

## 2018-04-02 DIAGNOSIS — R1011 Right upper quadrant pain: Secondary | ICD-10-CM | POA: Diagnosis not present

## 2018-04-02 NOTE — Progress Notes (Signed)
Subjective:    Patient ID: Shari Branch, female    DOB: [birth year], 73 y.o.   MRN: [id number]  Patient states that recently she awoke with severe pain in her right side.  The pain was located in her right ribs just below her nipple.  She states the pain was severe.  It was made worse by breathing or movement.  It lasted gradually over the last few days and is slowly starting to improve.  She is still tender to palpation over the ribs in that area as diagrammed.  She denies any cough or pleurisy or hemoptysis at the present time.  She denies any shortness of breath.  Her balance seems to be slightly off today as she is somewhat unsteady on her feet.  She also seems to be slurring her speech slightly.  Therefore I have recommended that she decrease the dose of her amitriptyline.  She is currently taken 150 mg p.o. nightly for trigeminal neuralgia.  I recommended decreasing it to 100 mg to see if the symptoms improve. Past Medical History:  Diagnosis Date  . GERD 01/25/2007   Qualifier: Diagnosis of  By: [place] LPN, Maudie Mercury    . Hepatic steatosis   . Hyperlipidemia   . Liver hemangioma   . Osteopenia    6/17 T -2.1 Left hip  . Thyroid disease   . Trigeminal neuralgia 1997   Trigemninal Tumor- Left, [place]   Past Surgical History:  Procedure Laterality Date  .  trigeminal nerve tumor     numbness on left side of face  . ABDOMINAL HYSTERECTOMY    . BREAST REDUCTION SURGERY    . CATARACT EXTRACTION     bilateral  . CHOLECYSTECTOMY    . COLONOSCOPY  2008   Dr. Oneida Alar: poor prep, inadequate exam  . COLONOSCOPY WITH ESOPHAGOGASTRODUODENOSCOPY (EGD)  2009   Dr. Oneida Alar: good prep, normal TI, normal colon, hemorrhoids, gastritis, nodule in cardia (benign)  . EAR PINNA RECONSTRUCTION W/ RIB GRAFT     7 surgeries starting at age 30  . ESOPHAGOGASTRODUODENOSCOPY  2008   Dr. Oneida Alar: Reflux esophagitis, large hh, gastritis.   Marland Kitchen [name] EXCISION Right 09/19/2013   Procedure: EXCISION [name] FOOT;  Surgeon: Marcheta Grammes, DPM;  Location: [facility];  Service: Podiatry;  Laterality: Right;  . OSTECTOMY Right 09/19/2013   Procedure: OSTECTOMY;  Surgeon: Marcheta Grammes, DPM;  Location: [facility];  Service: Podiatry;  Laterality: Right;  . RETINAL DETACHMENT SURGERY     bilateral  . TONSILLECTOMY     Current Outpatient Medications on File Prior to Visit  Medication Sig Dispense Refill  . amitriptyline (ELAVIL) 50 MG tablet TAKE 5 TABLETS BY MOUTH AT BEDTIME (Patient taking differently: 3 tabs po qhs) 150 tablet 3  . atorvastatin (LIPITOR) 20 MG tablet Take 1 tablet (20 mg total) by mouth daily. 90 tablet 3  . escitalopram (LEXAPRO) 20 MG tablet TAKE 1 TABLET BY MOUTH ONCE DAILY 90 tablet 3  . levothyroxine (SYNTHROID, LEVOTHROID) 112 MCG tablet TAKE 1 TABLET BY MOUTH ONCE DAILY 30 tablet 11  . naproxen (NAPROSYN) 500 MG tablet TAKE ONE TABLET BY MOUTH TWICE DAILY WITH MEALS 60 tablet 2  . pantoprazole (PROTONIX) 40 MG tablet TAKE ONE TABLET BY MOUTH TWICE DAILY 180 tablet 3   No current facility-administered medications on file prior to visit.    Allergies  Allergen Reactions  . Sulfonamide Derivatives     REACTION: Joint Pain and locking - walks like  a 73 year old   Social History   Socioeconomic History  . Marital status: Married    Spouse name: Not on file  . Number of children: 3  . Years of education: Not on file  . Highest education level: Not on file  Occupational History  . Occupation: PT - [employer]  Social Needs  . Financial resource strain: Not on file  . Food insecurity:    Worry: Not on file    Inability: Not on file  . Transportation needs:    Medical: Not on file    Non-medical: Not on file  Tobacco Use  . Smoking status: Current Every Day Smoker    Packs/day: 1.50  . Smokeless tobacco: Never Used  Substance and Sexual Activity  . Alcohol use: No  . Drug use: No  . Sexual activity: Not on file  Lifestyle    . Physical activity:    Days per week: Not on file    Minutes per session: Not on file  . Stress: Not on file  Relationships  . Social connections:    Talks on phone: Not on file    Gets together: Not on file    Attends religious service: Not on file    Active member of club or organization: Not on file    Attends meetings of clubs or organizations: Not on file    Relationship status: Not on file  . Intimate partner violence:    Fear of current or ex partner: Not on file    Emotionally abused: Not on file    Physically abused: Not on file    Forced sexual activity: Not on file  Other Topics Concern  . Not on file  Social History Narrative  . Not on file     Past Medical History:  Diagnosis Date  . GERD 01/25/2007   Qualifier: Diagnosis of  By: [place] LPN, Maudie Mercury    . Hepatic steatosis   . Hyperlipidemia   . Liver hemangioma   . Osteopenia    6/17 T -2.1 Left hip  . Thyroid disease   . Trigeminal neuralgia 1997   Trigemninal Tumor- Left, [place]   Past Surgical History:  Procedure Laterality Date  .  trigeminal nerve tumor     numbness on left side of face  . ABDOMINAL HYSTERECTOMY    . BREAST REDUCTION SURGERY    . CATARACT EXTRACTION     bilateral  . CHOLECYSTECTOMY    . COLONOSCOPY  2008   Dr. Oneida Alar: poor prep, inadequate exam  . COLONOSCOPY WITH ESOPHAGOGASTRODUODENOSCOPY (EGD)  2009   Dr. Oneida Alar: good prep, normal TI, normal colon, hemorrhoids, gastritis, nodule in cardia (benign)  . EAR PINNA RECONSTRUCTION W/ RIB GRAFT     7 surgeries starting at age 44  . ESOPHAGOGASTRODUODENOSCOPY  2008   Dr. Oneida Alar: Reflux esophagitis, large hh, gastritis.   Marland Kitchen [name] EXCISION Right 09/19/2013   Procedure: EXCISION [name] FOOT;  Surgeon: Marcheta Grammes, DPM;  Location: [facility];  Service: Podiatry;  Laterality: Right;  . OSTECTOMY Right 09/19/2013   Procedure: OSTECTOMY;  Surgeon: Marcheta Grammes, DPM;  Location: [facility];   Service: Podiatry;  Laterality: Right;  . RETINAL DETACHMENT SURGERY     bilateral  . TONSILLECTOMY     Current Outpatient Medications on File Prior to Visit  Medication Sig Dispense Refill  . amitriptyline (ELAVIL) 50 MG tablet TAKE 5 TABLETS BY MOUTH AT BEDTIME (Patient taking differently: 3 tabs po  qhs) 150 tablet 3  . atorvastatin (LIPITOR) 20 MG tablet Take 1 tablet (20 mg total) by mouth daily. 90 tablet 3  . escitalopram (LEXAPRO) 20 MG tablet TAKE 1 TABLET BY MOUTH ONCE DAILY 90 tablet 3  . levothyroxine (SYNTHROID, LEVOTHROID) 112 MCG tablet TAKE 1 TABLET BY MOUTH ONCE DAILY 30 tablet 11  . naproxen (NAPROSYN) 500 MG tablet TAKE ONE TABLET BY MOUTH TWICE DAILY WITH MEALS 60 tablet 2  . pantoprazole (PROTONIX) 40 MG tablet TAKE ONE TABLET BY MOUTH TWICE DAILY 180 tablet 3   No current facility-administered medications on file prior to visit.    Allergies  Allergen Reactions  . Sulfonamide Derivatives     REACTION: Joint Pain and locking - walks like a 73 year old   Social History   Socioeconomic History  . Marital status: Married    Spouse name: Not on file  . Number of children: 3  . Years of education: Not on file  . Highest education level: Not on file  Occupational History  . Occupation: PT - [employer]  Social Needs  . Financial resource strain: Not on file  . Food insecurity:    Worry: Not on file    Inability: Not on file  . Transportation needs:    Medical: Not on file    Non-medical: Not on file  Tobacco Use  . Smoking status: Current Every Day Smoker    Packs/day: 1.50  . Smokeless tobacco: Never Used  Substance and Sexual Activity  . Alcohol use: No  . Drug use: No  . Sexual activity: Not on file  Lifestyle  . Physical activity:    Days per week: Not on file    Minutes per session: Not on file  . Stress: Not on file  Relationships  . Social connections:    Talks on phone: Not on file    Gets together: Not on file    Attends religious service: Not  on file    Active member of club or organization: Not on file    Attends meetings of clubs or organizations: Not on file    Relationship status: Not on file  . Intimate partner violence:    Fear of current or ex partner: Not on file    Emotionally abused: Not on file    Physically abused: Not on file    Forced sexual activity: Not on file  Other Topics Concern  . Not on file  Social History Narrative  . Not on file      Review of Systems  All other systems reviewed and are negative.      Objective:   Physical Exam  Constitutional: She appears well-developed and well-nourished. No distress.  Neck: No JVD present. No thyromegaly present.  Cardiovascular: Normal rate, regular rhythm and normal heart sounds. Exam reveals no gallop.  No murmur heard. Pulmonary/Chest: Effort normal. No respiratory distress. She has no wheezes. She has no rhonchi. She has no rales. She exhibits tenderness and bony tenderness.    Abdominal: Soft. Bowel sounds are normal. She exhibits no distension. There is no abdominal tenderness. There is no rebound.  Musculoskeletal:     Lumbar back: She exhibits normal range of motion, no tenderness and no bony tenderness.  Lymphadenopathy:    She has no cervical adenopathy.  Skin: She is not diaphoretic.  Vitals reviewed.         Assessment & Plan:  Rib pain on right side - Plan: DG Ribs Unilateral Right, DG  Chest 2 View  RUQ pain - Plan: CBC with Differential/Platelet, COMPLETE METABOLIC PANEL WITH GFR  Hypothyroidism, unspecified type - Plan: TSH Chest pain seems to be skeletal in nature.  I will obtain x-rays of the right ribs as well as a chest x-ray to evaluate for any infiltrate.  Patient has had her gallbladder removed.  I do not believe the pain is due to an intra-abdominal abnormality.  I suspect a bruised or cracked rib.  Patient would like to check her thyroid while she is having lab work drawn today.

## 2018-04-03 ENCOUNTER — Ambulatory Visit (HOSPITAL_COMMUNITY)
Admission: RE | Admit: 2018-04-03 | Discharge: 2018-04-03 | Disposition: A | Discharge status: Home / Self Care | Payer: Medicare Other | Source: Ambulatory Visit | Attending: Family Medicine | Admitting: Family Medicine

## 2018-04-03 ENCOUNTER — Encounter: Payer: Self-pay | Admitting: *Deleted

## 2018-04-03 DIAGNOSIS — R0781 Pleurodynia: Secondary | ICD-10-CM | POA: Insufficient documentation

## 2018-04-03 DIAGNOSIS — S299XXA Unspecified injury of thorax, initial encounter: Secondary | ICD-10-CM | POA: Diagnosis not present

## 2018-04-03 LAB — TSH: TSH: 1.3 mIU/L (ref 0.40–4.50)

## 2018-04-03 LAB — CBC WITH DIFFERENTIAL/PLATELET
ABSOLUTE MONOCYTES: 238 {cells}/uL (ref 200–950)
BASOS PCT: 0.6 %
Basophils Absolute: 42 cells/uL (ref 0–200)
Eosinophils Absolute: 140 cells/uL (ref 15–500)
Eosinophils Relative: 2 %
HEMATOCRIT: 36.9 % (ref 35.0–45.0)
Hemoglobin: 12.7 g/dL (ref 11.7–15.5)
LYMPHS ABS: 2051 {cells}/uL (ref 850–3900)
MCH: 31.1 pg (ref 27.0–33.0)
MCHC: 34.4 g/dL (ref 32.0–36.0)
MCV: 90.4 fL (ref 80.0–100.0)
MPV: 10.4 fL (ref 7.5–12.5)
Monocytes Relative: 3.4 %
NEUTROS PCT: 64.7 %
Neutro Abs: 4529 cells/uL (ref 1500–7800)
Platelets: 218 10*3/uL (ref 140–400)
RBC: 4.08 10*6/uL (ref 3.80–5.10)
RDW: 12.3 % (ref 11.0–15.0)
Total Lymphocyte: 29.3 %
WBC: 7 10*3/uL (ref 3.8–10.8)

## 2018-04-03 LAB — COMPLETE METABOLIC PANEL WITH GFR
AG Ratio: 1.9 (calc) (ref 1.0–2.5)
ALBUMIN MSPROF: 4.2 g/dL (ref 3.6–5.1)
ALT: 15 U/L (ref 6–29)
AST: 20 U/L (ref 10–35)
Alkaline phosphatase (APISO): 110 U/L (ref 33–130)
BILIRUBIN TOTAL: 0.4 mg/dL (ref 0.2–1.2)
BUN: 9 mg/dL (ref 7–25)
CO2: 26 mmol/L (ref 20–32)
CREATININE: 0.66 mg/dL (ref 0.60–0.93)
Calcium: 9 mg/dL (ref 8.6–10.4)
Chloride: 102 mmol/L (ref 98–110)
GFR, Est African American: 102 mL/min/{1.73_m2} (ref >=60)
GFR, Est Non African American: 88 mL/min/{1.73_m2} (ref >=60)
GLOBULIN: 2.2 g/dL (ref 1.9–3.7)
GLUCOSE: 90 mg/dL (ref 65–99)
Potassium: 4.2 mmol/L (ref 3.5–5.3)
SODIUM: 138 mmol/L (ref 135–146)
Total Protein: 6.4 g/dL (ref 6.1–8.1)

## 2018-05-10 DIAGNOSIS — N8110 Cystocele, unspecified: Secondary | ICD-10-CM | POA: Diagnosis not present

## 2018-05-10 DIAGNOSIS — Z6826 Body mass index (BMI) 26.0-26.9, adult: Secondary | ICD-10-CM | POA: Diagnosis not present

## 2018-05-10 DIAGNOSIS — N393 Stress incontinence (female) (male): Secondary | ICD-10-CM | POA: Diagnosis not present

## 2018-05-10 DIAGNOSIS — R339 Retention of urine, unspecified: Secondary | ICD-10-CM | POA: Diagnosis not present

## 2018-05-10 DIAGNOSIS — Z4689 Encounter for fitting and adjustment of other specified devices: Secondary | ICD-10-CM | POA: Diagnosis not present

## 2018-05-14 ENCOUNTER — Other Ambulatory Visit: Payer: Self-pay | Admitting: Family Medicine

## 2018-06-18 ENCOUNTER — Other Ambulatory Visit: Payer: Self-pay | Admitting: Family Medicine

## 2018-07-17 DIAGNOSIS — N393 Stress incontinence (female) (male): Secondary | ICD-10-CM | POA: Diagnosis not present

## 2018-07-27 ENCOUNTER — Other Ambulatory Visit: Payer: Self-pay | Admitting: Family Medicine

## 2018-07-31 DIAGNOSIS — Z6826 Body mass index (BMI) 26.0-26.9, adult: Secondary | ICD-10-CM | POA: Diagnosis not present

## 2018-07-31 DIAGNOSIS — N393 Stress incontinence (female) (male): Secondary | ICD-10-CM | POA: Diagnosis not present

## 2018-07-31 DIAGNOSIS — N8110 Cystocele, unspecified: Secondary | ICD-10-CM | POA: Diagnosis not present

## 2018-08-20 ENCOUNTER — Other Ambulatory Visit: Payer: Self-pay | Admitting: Family Medicine

## 2018-09-14 ENCOUNTER — Other Ambulatory Visit: Payer: Self-pay | Admitting: Family Medicine

## 2018-10-15 ENCOUNTER — Ambulatory Visit (INDEPENDENT_AMBULATORY_CARE_PROVIDER_SITE_OTHER): Payer: Medicare Other | Admitting: Otolaryngology

## 2018-10-15 ENCOUNTER — Other Ambulatory Visit: Payer: Self-pay

## 2018-10-15 DIAGNOSIS — H903 Sensorineural hearing loss, bilateral: Secondary | ICD-10-CM | POA: Diagnosis not present

## 2018-10-15 DIAGNOSIS — H900 Conductive hearing loss, bilateral: Secondary | ICD-10-CM | POA: Diagnosis not present

## 2018-10-18 ENCOUNTER — Other Ambulatory Visit: Payer: Self-pay | Admitting: Family Medicine

## 2018-11-21 ENCOUNTER — Other Ambulatory Visit: Payer: Self-pay | Admitting: Family Medicine

## 2018-12-23 ENCOUNTER — Other Ambulatory Visit: Payer: Self-pay | Admitting: Family Medicine

## 2018-12-25 ENCOUNTER — Encounter: Payer: Self-pay | Admitting: Family Medicine

## 2018-12-25 ENCOUNTER — Ambulatory Visit (HOSPITAL_COMMUNITY)
Admission: RE | Admit: 2018-12-25 | Discharge: 2018-12-25 | Disposition: A | Discharge status: Home / Self Care | Payer: Medicare Other | Source: Ambulatory Visit | Attending: Family Medicine | Admitting: Family Medicine

## 2018-12-25 ENCOUNTER — Telehealth: Payer: Self-pay | Admitting: Family Medicine

## 2018-12-25 ENCOUNTER — Other Ambulatory Visit: Payer: Self-pay

## 2018-12-25 ENCOUNTER — Ambulatory Visit (INDEPENDENT_AMBULATORY_CARE_PROVIDER_SITE_OTHER): Payer: Medicare Other | Admitting: Family Medicine

## 2018-12-25 VITALS — BP 120/70 | HR 94 | Temp 97.9°F | Resp 16 | Ht 68.0 in | Wt 174.0 lb

## 2018-12-25 DIAGNOSIS — M545 Low back pain, unspecified: Secondary | ICD-10-CM

## 2018-12-25 DIAGNOSIS — Z23 Encounter for immunization: Secondary | ICD-10-CM | POA: Diagnosis not present

## 2018-12-25 DIAGNOSIS — S3992XA Unspecified injury of lower back, initial encounter: Secondary | ICD-10-CM | POA: Diagnosis not present

## 2018-12-25 MED ORDER — HYDROCODONE-ACETAMINOPHEN 5-325 MG PO TABS: 1.0000 | ORAL_TABLET | Freq: Four times a day (QID) | ORAL | 0 refills | Status: DC | PRN

## 2018-12-25 NOTE — Telephone Encounter (Signed)
Just FYI - with the new DEA law the pharm will only give her 5 days worth of pain medication so she only received 20 pills instead of 30. She states that should be plenty but I informed her if she needed more to just call and let us know.

## 2018-12-25 NOTE — Addendum Note (Signed)
Addended by: Shary Decamp B on: 12/25/2018 02:45 PM   Modules accepted: Orders

## 2018-12-25 NOTE — Progress Notes (Signed)
Subjective:    Patient ID: Shari Branch, female    DOB: Apr 29, 1945, 73 y.o.   MRN: [id number] About 10 or 11 days ago, the patient slipped while she was standing in the bathroom on some water.  She fell directly on her tailbone from a standing height.  The floor is hard.  She instantly felt pain radiate up from her tailbone all the way to her neck.  She now has constant bandlike pain in her lower back roughly at the level of L4 and L5 that wraps around both iliac crest to the front.  The pain is constant.  Is made worse by movement and by prolonged standing.  She denies any bowel or bladder incontinence.  She denies any saddle anesthesia.  She denies any lumbar radiculopathy or sciatica.  She denies any leg weakness or numbness.  She has normal reflexes checked the patella and Achilles.  She has normal muscle strength in both legs. Past Medical History:  Diagnosis Date  . GERD 01/25/2007   Qualifier: Diagnosis of  By: [place] LPN, Maudie Mercury    . Hepatic steatosis   . Hyperlipidemia   . Liver hemangioma   . Osteopenia    6/17 T -2.1 Left hip  . Thyroid disease   . Trigeminal neuralgia 1997   Trigemninal Tumor- Left, [place]   Past Surgical History:  Procedure Laterality Date  .  trigeminal nerve tumor     numbness on left side of face  . ABDOMINAL HYSTERECTOMY    . BREAST REDUCTION SURGERY    . CATARACT EXTRACTION     bilateral  . CHOLECYSTECTOMY    . COLONOSCOPY  2008   Dr. Oneida Alar: poor prep, inadequate exam  . COLONOSCOPY WITH ESOPHAGOGASTRODUODENOSCOPY (EGD)  2009   Dr. Oneida Alar: good prep, normal TI, normal colon, hemorrhoids, gastritis, nodule in cardia (benign)  . EAR PINNA RECONSTRUCTION W/ RIB GRAFT     7 surgeries starting at age 26  . ESOPHAGOGASTRODUODENOSCOPY  2008   Dr. Oneida Alar: Reflux esophagitis, large hh, gastritis.   Marland Kitchen [name] EXCISION Right 09/19/2013   Procedure: EXCISION [name] FOOT;  Surgeon: Marcheta Grammes, DPM;  Location: [facility];  Service: Podiatry;  Laterality: Right;  . OSTECTOMY Right 09/19/2013   Procedure: OSTECTOMY;  Surgeon: Marcheta Grammes, DPM;  Location: [facility];  Service: Podiatry;  Laterality: Right;  . RETINAL DETACHMENT SURGERY     bilateral  . TONSILLECTOMY     Current Outpatient Medications on File Prior to Visit  Medication Sig Dispense Refill  . amitriptyline (ELAVIL) 50 MG tablet TAKE 3 TABLETS BY MOUTH EVERY DAY AT BEDTIME 90 tablet 1  . atorvastatin (LIPITOR) 20 MG tablet Take 1 tablet (20 mg total) by mouth daily. 90 tablet 3  . escitalopram (LEXAPRO) 20 MG tablet Take 1 tablet by mouth once daily 90 tablet 2  . levothyroxine (SYNTHROID, LEVOTHROID) 112 MCG tablet TAKE 1 TABLET BY MOUTH ONCE DAILY 30 tablet 11  . naproxen (NAPROSYN) 500 MG tablet TAKE ONE TABLET BY MOUTH TWICE DAILY WITH MEALS 60 tablet 2  . pantoprazole (PROTONIX) 40 MG tablet Take 1 tablet by mouth twice daily 180 tablet 0   No current facility-administered medications on file prior to visit.    Allergies  Allergen Reactions  . Sulfonamide Derivatives     REACTION: Joint Pain and locking - walks like a 73 year old   Social History   Socioeconomic History  . Marital status: Married  Spouse name: Not on file  . Number of children: 3  . Years of education: Not on file  . Highest education level: Not on file  Occupational History  . Occupation: PT - [employer]  Social Needs  . Financial resource strain: Not on file  . Food insecurity    Worry: Not on file    Inability: Not on file  . Transportation needs    Medical: Not on file    Non-medical: Not on file  Tobacco Use  . Smoking status: Current Every Day Smoker    Packs/day: 1.50  . Smokeless tobacco: Never Used  Substance and Sexual Activity  . Alcohol use: No  . Drug use: No  . Sexual activity: Not on file  Lifestyle  . Physical activity    Days per week: Not on file    Minutes per session: Not on file  . Stress: Not on file  Relationships  .  Social Herbalist on phone: Not on file    Gets together: Not on file    Attends religious service: Not on file    Active member of club or organization: Not on file    Attends meetings of clubs or organizations: Not on file    Relationship status: Not on file  . Intimate partner violence    Fear of current or ex partner: Not on file    Emotionally abused: Not on file    Physically abused: Not on file    Forced sexual activity: Not on file  Other Topics Concern  . Not on file  Social History Narrative  . Not on file     Past Medical History:  Diagnosis Date  . GERD 01/25/2007   Qualifier: Diagnosis of  By: [place] LPN, Maudie Mercury    . Hepatic steatosis   . Hyperlipidemia   . Liver hemangioma   . Osteopenia    6/17 T -2.1 Left hip  . Thyroid disease   . Trigeminal neuralgia 1997   Trigemninal Tumor- Left, [place]   Past Surgical History:  Procedure Laterality Date  .  trigeminal nerve tumor     numbness on left side of face  . ABDOMINAL HYSTERECTOMY    . BREAST REDUCTION SURGERY    . CATARACT EXTRACTION     bilateral  . CHOLECYSTECTOMY    . COLONOSCOPY  2008   Dr. Oneida Alar: poor prep, inadequate exam  . COLONOSCOPY WITH ESOPHAGOGASTRODUODENOSCOPY (EGD)  2009   Dr. Oneida Alar: good prep, normal TI, normal colon, hemorrhoids, gastritis, nodule in cardia (benign)  . EAR PINNA RECONSTRUCTION W/ RIB GRAFT     7 surgeries starting at age 17  . ESOPHAGOGASTRODUODENOSCOPY  2008   Dr. Oneida Alar: Reflux esophagitis, large hh, gastritis.   Marland Kitchen [name] EXCISION Right 09/19/2013   Procedure: EXCISION [name] FOOT;  Surgeon: Marcheta Grammes, DPM;  Location: [facility];  Service: Podiatry;  Laterality: Right;  . OSTECTOMY Right 09/19/2013   Procedure: OSTECTOMY;  Surgeon: Marcheta Grammes, DPM;  Location: [facility];  Service: Podiatry;  Laterality: Right;  . RETINAL DETACHMENT SURGERY     bilateral  . TONSILLECTOMY     Current Outpatient  Medications on File Prior to Visit  Medication Sig Dispense Refill  . amitriptyline (ELAVIL) 50 MG tablet TAKE 3 TABLETS BY MOUTH EVERY DAY AT BEDTIME 90 tablet 1  . atorvastatin (LIPITOR) 20 MG tablet Take 1 tablet (20 mg total) by mouth daily. 90 tablet 3  . escitalopram (  LEXAPRO) 20 MG tablet Take 1 tablet by mouth once daily 90 tablet 2  . levothyroxine (SYNTHROID, LEVOTHROID) 112 MCG tablet TAKE 1 TABLET BY MOUTH ONCE DAILY 30 tablet 11  . naproxen (NAPROSYN) 500 MG tablet TAKE ONE TABLET BY MOUTH TWICE DAILY WITH MEALS 60 tablet 2  . pantoprazole (PROTONIX) 40 MG tablet Take 1 tablet by mouth twice daily 180 tablet 0   No current facility-administered medications on file prior to visit.    Allergies  Allergen Reactions  . Sulfonamide Derivatives     REACTION: Joint Pain and locking - walks like a 73 year old   Social History   Socioeconomic History  . Marital status: Married    Spouse name: Not on file  . Number of children: 3  . Years of education: Not on file  . Highest education level: Not on file  Occupational History  . Occupation: PT - [employer]  Social Needs  . Financial resource strain: Not on file  . Food insecurity    Worry: Not on file    Inability: Not on file  . Transportation needs    Medical: Not on file    Non-medical: Not on file  Tobacco Use  . Smoking status: Current Every Day Smoker    Packs/day: 1.50  . Smokeless tobacco: Never Used  Substance and Sexual Activity  . Alcohol use: No  . Drug use: No  . Sexual activity: Not on file  Lifestyle  . Physical activity    Days per week: Not on file    Minutes per session: Not on file  . Stress: Not on file  Relationships  . Social Herbalist on phone: Not on file    Gets together: Not on file    Attends religious service: Not on file    Active member of club or organization: Not on file    Attends meetings of clubs or organizations: Not on file    Relationship status: Not on file  .  Intimate partner violence    Fear of current or ex partner: Not on file    Emotionally abused: Not on file    Physically abused: Not on file    Forced sexual activity: Not on file  Other Topics Concern  . Not on file  Social History Narrative  . Not on file      Review of Systems  All other systems reviewed and are negative.      Objective:   Physical Exam  Constitutional: She appears well-developed and well-nourished. No distress.  Cardiovascular: Normal rate, regular rhythm and normal heart sounds. Exam reveals no gallop.  No murmur heard. Pulmonary/Chest: Effort normal. No respiratory distress. She has no wheezes. She has no rhonchi. She has no rales.  Musculoskeletal:     Lumbar back: She exhibits decreased range of motion, tenderness and pain. She exhibits no bony tenderness and no spasm.       Back:  Neurological: She has normal reflexes. She exhibits normal muscle tone. Coordination normal.  Skin: She is not diaphoretic.  Vitals reviewed.         Assessment & Plan:  Acute midline low back pain without sciatica - Plan: DG Lumbar Spine Complete  I am concerned about a possible vertebral fracture.  I recommended an x-ray of the lumbar spine to evaluate further.  Meanwhile treat the pain with hydrocodone/acetaminophen 5/325 1 p.o. every 6 hours as needed pain.  The patient is already tried naproxen with no  relief.

## 2019-01-07 ENCOUNTER — Other Ambulatory Visit: Payer: Self-pay | Admitting: Family Medicine

## 2019-01-07 MED ORDER — HYDROCODONE-ACETAMINOPHEN 5-325 MG PO TABS: 1.0000 | ORAL_TABLET | Freq: Four times a day (QID) | ORAL | 0 refills | Status: DC | PRN

## 2019-01-07 NOTE — Telephone Encounter (Signed)
Patient is requesting a refill on Hydrocodone   LOV: 12/25/18  LRF:   12/25/18

## 2019-01-16 ENCOUNTER — Other Ambulatory Visit: Payer: Self-pay | Admitting: Family Medicine

## 2019-01-21 ENCOUNTER — Other Ambulatory Visit: Payer: Self-pay

## 2019-01-22 ENCOUNTER — Encounter: Payer: Self-pay | Admitting: Family Medicine

## 2019-01-22 ENCOUNTER — Ambulatory Visit (INDEPENDENT_AMBULATORY_CARE_PROVIDER_SITE_OTHER): Payer: Medicare Other | Admitting: Family Medicine

## 2019-01-22 ENCOUNTER — Other Ambulatory Visit: Payer: Self-pay | Admitting: Family Medicine

## 2019-01-22 VITALS — BP 130/74 | HR 88 | Temp 97.0°F | Resp 16 | Ht 68.0 in | Wt 173.0 lb

## 2019-01-22 DIAGNOSIS — S22080A Wedge compression fracture of T11-T12 vertebra, initial encounter for closed fracture: Secondary | ICD-10-CM | POA: Diagnosis not present

## 2019-01-22 DIAGNOSIS — M545 Low back pain, unspecified: Secondary | ICD-10-CM

## 2019-01-22 MED ORDER — HYDROCODONE-ACETAMINOPHEN 5-325 MG PO TABS: 1.0000 | ORAL_TABLET | Freq: Four times a day (QID) | ORAL | 0 refills | Status: DC | PRN

## 2019-01-22 NOTE — Progress Notes (Signed)
Subjective:    Patient ID: Shari Branch, female    DOB: 02-25-1946, 73 y.o.   MRN: [id number]  12/25/18 About 10 or 11 days ago, the patient slipped while she was standing in the bathroom on some water.  She fell directly on her tailbone from a standing height.  The floor is hard.  She instantly felt pain radiate up from her tailbone all the way to her neck.  She now has constant bandlike pain in her lower back roughly at the level of L4 and L5 that wraps around both iliac crest to the front.  The pain is constant.  Is made worse by movement and by prolonged standing.  She denies any bowel or bladder incontinence.  She denies any saddle anesthesia.  She denies any lumbar radiculopathy or sciatica.  She denies any leg weakness or numbness.  She has normal reflexes checked the patella and Achilles.  She has normal muscle strength in both legs.  At that time, my plan was: I am concerned about a possible vertebral fracture.  I recommended an x-ray of the lumbar spine to evaluate further.  Meanwhile treat the pain with hydrocodone/acetaminophen 5/325 1 p.o. every 6 hours as needed pain.  The patient is already tried naproxen with no relief.  01/22/19 X-ray does show a compression fracture at T12.  However the patient's pain seems lower than that.  She is also complaining of pain roughly around the level of L4 in a bandlike pattern.  The pain is intense.  She is having to take hydrocodone every 6 hours.  1 pill every 6 hours does not alleviate the pain.  She denies any symptoms of cauda equina syndrome.  She denies any bowel or bladder incontinence.  She denies any leg weakness.  She denies any leg numbness.  She denies any radicular symptoms.  However the pain is intense and is a stabbing pain every time she stands up.  It occurred suddenly approximately 6 weeks ago when the patient slipped as stated above.  Therefore this would be consistent with an acute injury which would go along with compression fracture  seen at T12.  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 Oneida Alar: poor prep, inadequate exam   COLONOSCOPY WITH ESOPHAGOGASTRODUODENOSCOPY (EGD)  2009   Dr. Oneida Alar: good prep, normal TI, normal colon, hemorrhoids, gastritis, nodule in cardia (benign)   EAR PINNA RECONSTRUCTION W/ RIB GRAFT     7 surgeries starting at age 67   ESOPHAGOGASTRODUODENOSCOPY  2008   Dr. Oneida Alar: Reflux esophagitis, large hh, gastritis.    [name] EXCISION Right 09/19/2013   Procedure: EXCISION [name] FOOT;  Surgeon: Marcheta Grammes, DPM;  Location: [facility];  Service: Podiatry;  Laterality: Right;   OSTECTOMY Right 09/19/2013   Procedure:  OSTECTOMY;  Surgeon: Marcheta Grammes, DPM;  Location: [facility];  Service: Podiatry;  Laterality: Right;   RETINAL DETACHMENT SURGERY     bilateral   TONSILLECTOMY     Current Outpatient Medications on File Prior to Visit  Medication Sig Dispense Refill   amitriptyline (ELAVIL) 50 MG tablet TAKE 3 TABLETS BY MOUTH EVERY DAY AT BEDTIME 90 tablet 1   atorvastatin (LIPITOR) 20 MG tablet Take 1 tablet (20 mg total) by mouth daily. 90 tablet 3   escitalopram (LEXAPRO) 20 MG tablet Take 1 tablet by mouth once daily 90 tablet 2   levothyroxine (SYNTHROID) 112 MCG tablet Take 1 tablet by mouth once daily 90 tablet 0   naproxen (NAPROSYN) 500 MG tablet TAKE ONE TABLET BY MOUTH  TWICE DAILY WITH MEALS 60 tablet 2   No current facility-administered medications on file prior to visit.    Allergies  Allergen Reactions   Sulfonamide Derivatives     REACTION: Joint Pain and locking - walks like a 73 year old   Social History   Socioeconomic History   Marital status: Married    Spouse name: Not on file   Number of children: 3   Years of education: Not on file   Highest education level: Not on file  Occupational History   Occupation: PT - [employer]  Social Needs   Emergency planning/management officer strain: Not on file   Food insecurity    Worry: Not on file    Inability: Not on file   Transportation needs    Medical: Not on file    Non-medical: Not on file  Tobacco Use   Smoking status: Current Every Day Smoker    Packs/day: 1.50   Smokeless tobacco: Never Used  Substance and Sexual Activity   Alcohol use: No   Drug use: No   Sexual activity: Not on file  Lifestyle   Physical activity    Days per week: Not on file    Minutes per session: Not on file   Stress: Not on file  Relationships   Social connections    Talks on phone: Not on file    Gets together: Not on file    Attends religious service: Not on file    Active member of club or organization: Not on file    Attends meetings of clubs or organizations: Not on file    Relationship status: Not on file   Intimate partner violence    Fear of current or ex partner: Not on file    Emotionally abused: Not on file    Physically abused: Not on file    Forced sexual activity: Not on file  Other Topics Concern   Not on file  Social History Narrative   Not on file     Past Medical History:  Diagnosis Date   GERD 01/25/2007   Qualifier: Diagnosis of  By: [place] LPN, Kim     Hepatic steatosis    Hyperlipidemia    Liver hemangioma    Osteopenia    6/17 T -2.1 Left hip   Thyroid disease    Trigeminal neuralgia 1997   Trigemninal Tumor- Left, [place]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 Oneida Alar: poor prep, inadequate exam   COLONOSCOPY WITH ESOPHAGOGASTRODUODENOSCOPY (EGD)  2009   Dr. Oneida Alar: good prep, normal TI, normal colon, hemorrhoids, gastritis, nodule in cardia (benign)   EAR PINNA RECONSTRUCTION W/ RIB GRAFT     7 surgeries starting at age 28   ESOPHAGOGASTRODUODENOSCOPY  2008   Dr. Oneida Alar: Reflux esophagitis, large hh, gastritis.    [name] EXCISION Right 09/19/2013   Procedure: EXCISION [name] FOOT;  Surgeon: Marcheta Grammes, DPM;  Location: [facility];  Service: Podiatry;  Laterality: Right;   OSTECTOMY Right 09/19/2013   Procedure: OSTECTOMY;  Surgeon: Marcheta Grammes, DPM;  Location: [facility];  Service: Podiatry;  Laterality: Right;   RETINAL DETACHMENT SURGERY     bilateral   TONSILLECTOMY     Current Outpatient Medications on File Prior to Visit  Medication Sig Dispense Refill   amitriptyline (ELAVIL) 50 MG tablet TAKE 3 TABLETS BY MOUTH EVERY DAY AT BEDTIME 90 tablet 1   atorvastatin (LIPITOR) 20 MG tablet Take 1 tablet (20 mg total) by mouth daily. 90 tablet 3   escitalopram (LEXAPRO) 20 MG tablet Take 1 tablet by mouth once daily 90 tablet 2   levothyroxine (SYNTHROID) 112 MCG tablet Take 1 tablet by mouth once daily 90 tablet 0   naproxen (NAPROSYN) 500 MG tablet TAKE ONE TABLET BY MOUTH TWICE DAILY WITH MEALS 60 tablet 2   No current facility-administered medications on file prior to visit.    Allergies  Allergen Reactions   Sulfonamide Derivatives     REACTION: Joint Pain and locking - walks like a 73 year old   Social History   Socioeconomic History   Marital status: Married    Spouse name: Not on file   Number of children: 3   Years of education: Not on  file   Highest education level: Not on file  Occupational History   Occupation: PT - [employer]  Social Needs   Emergency planning/management officer strain: Not on file   Food insecurity    Worry: Not on file    Inability: Not on file   Transportation needs    Medical: Not on file    Non-medical: Not on file  Tobacco Use   Smoking status: Current Every Day Smoker    Packs/day: 1.50   Smokeless tobacco: Never Used  Substance and Sexual Activity   Alcohol use: No   Drug use: No   Sexual activity: Not on file  Lifestyle   Physical activity    Days per week: Not on file    Minutes per session: Not on file   Stress: Not on file  Relationships   Social connections    Talks on phone: Not on file    Gets together: Not on file    Attends religious service: Not on file    Active member of club or organization: Not on file    Attends meetings of clubs or organizations: Not on file    Relationship status: Not on file   Intimate partner violence    Fear of current or ex partner: Not on file    Emotionally abused: Not on file    Physically abused: Not on file    Forced sexual activity: Not on file  Other Topics Concern   Not on file  Social History Narrative   Not on file      Review of Systems  All other systems reviewed and are negative.      Objective:  Physical Exam  Constitutional: She appears well-developed and well-nourished. No distress.  Cardiovascular: Normal rate, regular rhythm and normal heart sounds. Exam reveals no gallop.  No murmur heard. Pulmonary/Chest: Effort normal. No respiratory distress. She has no wheezes. She has no rhonchi. She has no rales.  Musculoskeletal:     Lumbar back: She exhibits decreased range of motion, tenderness and pain. She exhibits no bony tenderness and no spasm.       Back:  Neurological: She has normal reflexes. She exhibits normal muscle tone. Coordination normal.  Skin: She is not diaphoretic.  Vitals reviewed.           Assessment & Plan:  Closed wedge compression fracture of T12 vertebra, initial encounter ([place]) - Plan: MR Lumbar Spine Wo Contrast  Acute bilateral low back pain without sciatica - Plan: MR Lumbar Spine Wo Contrast  Patient's pain is lower than the location of the compression fracture.  Therefore I recommended an MRI of the lumbar spine.  Of asked if they can include T12 on the images as well.  This would give [place] a look at T12 as well as the lower spine where she reports the pain.  If the MRI confirms an acute vertebral fracture at T12 and there is no significant arthritis seen lower in the spine that would explain the patient's pain, I would refer the patient for possible kyphoplasty to help alleviate her pain as she is requiring an extensive use of opiates.  However if the compression fracture appears old or if there is another significant finding lower in the MRI at the level of L4-L5, the patient may benefit from seeing neurosurgery to discuss cortisone injections as the compression fracture at T12 may be a coincidental finding.  Hydrocodone was refilled and 60 tablets were given until we can obtain information on the MRI.

## 2019-01-24 ENCOUNTER — Encounter: Payer: Self-pay | Admitting: Family Medicine

## 2019-01-30 ENCOUNTER — Ambulatory Visit (HOSPITAL_COMMUNITY): Admission: RE | Admit: 2019-01-30 | Payer: Medicare Other | Source: Ambulatory Visit

## 2019-02-06 DIAGNOSIS — N8111 Cystocele, midline: Secondary | ICD-10-CM | POA: Diagnosis not present

## 2019-02-06 DIAGNOSIS — Z4689 Encounter for fitting and adjustment of other specified devices: Secondary | ICD-10-CM | POA: Diagnosis not present

## 2019-02-15 ENCOUNTER — Other Ambulatory Visit: Payer: Self-pay

## 2019-02-15 ENCOUNTER — Ambulatory Visit (HOSPITAL_COMMUNITY)
Admission: RE | Admit: 2019-02-15 | Discharge: 2019-02-15 | Disposition: A | Discharge status: Home / Self Care | Payer: Medicare Other | Source: Ambulatory Visit | Attending: Family Medicine | Admitting: Family Medicine

## 2019-02-15 DIAGNOSIS — M545 Low back pain, unspecified: Secondary | ICD-10-CM

## 2019-02-15 DIAGNOSIS — S22080A Wedge compression fracture of T11-T12 vertebra, initial encounter for closed fracture: Secondary | ICD-10-CM

## 2019-02-20 ENCOUNTER — Other Ambulatory Visit: Payer: Self-pay | Admitting: Family Medicine

## 2019-02-20 MED ORDER — ALENDRONATE SODIUM 70 MG PO TABS: 70.0000 mg | ORAL_TABLET | ORAL | 11 refills | Status: DC

## 2019-02-25 ENCOUNTER — Other Ambulatory Visit: Payer: Self-pay | Admitting: Family Medicine

## 2019-02-25 MED ORDER — HYDROCODONE-ACETAMINOPHEN 5-325 MG PO TABS: 1.0000 | ORAL_TABLET | Freq: Four times a day (QID) | ORAL | 0 refills | Status: DC | PRN

## 2019-02-25 NOTE — Telephone Encounter (Signed)
Patient is requesting a refill on Hydrocodone   LOV: 01/22/2019  LRF:   01/22/19

## 2019-04-09 ENCOUNTER — Other Ambulatory Visit: Payer: Self-pay | Admitting: Family Medicine

## 2019-04-09 MED ORDER — HYDROCODONE-ACETAMINOPHEN 5-325 MG PO TABS: 1.0000 | ORAL_TABLET | Freq: Four times a day (QID) | ORAL | 0 refills | Status: DC | PRN

## 2019-04-09 NOTE — Telephone Encounter (Signed)
Patient is requesting a refill on Hydrocodone   LOV: 01/22/2019  LRF:   02/25/19

## 2019-05-13 ENCOUNTER — Other Ambulatory Visit: Payer: Self-pay | Admitting: Family Medicine

## 2019-05-14 ENCOUNTER — Ambulatory Visit (INDEPENDENT_AMBULATORY_CARE_PROVIDER_SITE_OTHER): Payer: Medicare Other | Admitting: Family Medicine

## 2019-05-14 ENCOUNTER — Encounter (HOSPITAL_COMMUNITY): Payer: Self-pay

## 2019-05-14 ENCOUNTER — Other Ambulatory Visit: Payer: Self-pay

## 2019-05-14 ENCOUNTER — Ambulatory Visit (HOSPITAL_COMMUNITY)
Admission: RE | Admit: 2019-05-14 | Discharge: 2019-05-14 | Disposition: A | Discharge status: Home / Self Care | Payer: Medicare Other | Source: Ambulatory Visit | Attending: Family Medicine | Admitting: Family Medicine

## 2019-05-14 VITALS — BP 120/72 | HR 105 | Temp 97.8°F | Resp 18 | Ht 68.0 in | Wt 172.0 lb

## 2019-05-14 DIAGNOSIS — S4992XA Unspecified injury of left shoulder and upper arm, initial encounter: Secondary | ICD-10-CM | POA: Diagnosis not present

## 2019-05-14 DIAGNOSIS — M25512 Pain in left shoulder: Secondary | ICD-10-CM

## 2019-05-14 MED ORDER — PREDNISONE 20 MG PO TABS: ORAL_TABLET | ORAL | 0 refills | Status: DC

## 2019-05-14 MED ORDER — HYDROCODONE-ACETAMINOPHEN 5-325 MG PO TABS: 1.0000 | ORAL_TABLET | Freq: Four times a day (QID) | ORAL | 0 refills | Status: DC | PRN

## 2019-05-14 NOTE — Progress Notes (Signed)
Subjective:    Patient ID: Shari Branch, female    DOB: Jan 04, 1946, 74 y.o.   MRN: [id number]  12/25/18 About 10 or 11 days ago, the patient slipped while she was standing in the bathroom on some water.  She fell directly on her tailbone from a standing height.  The floor is hard.  She instantly felt pain radiate up from her tailbone all the way to her neck.  She now has constant bandlike pain in her lower back roughly at the level of L4 and L5 that wraps around both iliac crest to the front.  The pain is constant.  Is made worse by movement and by prolonged standing.  She denies any bowel or bladder incontinence.  She denies any saddle anesthesia.  She denies any lumbar radiculopathy or sciatica.  She denies any leg weakness or numbness.  She has normal reflexes checked the patella and Achilles.  She has normal muscle strength in both legs.  At that time, my plan was: I am concerned about a possible vertebral fracture.  I recommended an x-ray of the lumbar spine to evaluate further.  Meanwhile treat the pain with hydrocodone/acetaminophen 5/325 1 p.o. every 6 hours as needed pain.  The patient is already tried naproxen with no relief.  01/22/19 X-ray does show a compression fracture at T12.  However the patient's pain seems lower than that.  She is also complaining of pain roughly around the level of L4 in a bandlike pattern.  The pain is intense.  She is having to take hydrocodone every 6 hours.  1 pill every 6 hours does not alleviate the pain.  She denies any symptoms of cauda equina syndrome.  She denies any bowel or bladder incontinence.  She denies any leg weakness.  She denies any leg numbness.  She denies any radicular symptoms.  However the pain is intense and is a stabbing pain every time she stands up.  It occurred suddenly approximately 6 weeks ago when the patient slipped as stated above.  Therefore this would be consistent with an acute injury which would go along with compression fracture  seen at T12.  Patient is interested in possible kyphoplasty.  At that time, my plan was: Patient's pain is lower than the location of the compression fracture.  Therefore I recommended an MRI of the lumbar spine.  Of asked if they can include T12 on the images as well.  This would give [place] a look at T12 as well as the lower spine where she reports the pain.  If the MRI confirms an acute vertebral fracture at T12 and there is no significant arthritis seen lower in the spine that would explain the patient's pain, I would refer the patient for possible kyphoplasty to help alleviate her pain as she is requiring an extensive use of opiates.  However if the compression fracture appears old or if there is another significant finding lower in the MRI at the level of L4-L5, the patient may benefit from seeing neurosurgery to discuss cortisone injections as the compression fracture at T12 may be a coincidental finding.  Hydrocodone was refilled and 60 tablets were given until we can obtain information on the MRI.   MRI Mild fracture inferior endplate of 624THL appears recent. Mild superior endplate fracture of L1 on the left also appears recent. No significant spinal stenosis. Mild bony retropulsion inferior endplate of 624THL into the canal  Recommended time an d fosamax  05/14/19 Patient's back pain was improving.  However she fell 3 weeks ago.  She was leaving her room and turned the wrong direction in her hallway and tripped over boxes.  She landed on a hard floor on her left side striking her left shoulder.  There was no loss of consciousness.  However she has been dealing with pain in her left shoulder ever since.  However her exam today is completely benign.  She has full range of motion including abduction to 160 degrees in the left shoulder without any significant pain.  She has no pain with internal or external rotation.  She has no pain with empty can testing.  She has a negative drop sign.  She has no pain  with Hawkins maneuver.  She has no pain with cross body reach.  She has a negative Yergason sign.  She has a negative speeds test.  There is no crepitus in the shoulder with range of motion or pain with range of motion.  She does report numbness and tingling radiating down her left arm from her shoulder all the way into her hand.  She also reports an aching and throbbing sensation in her left forearm radiating there from her shoulder area suggesting some type of cervical radiculopathy however she has 2/4 reflexes at the brachioradialis, biceps, and triceps. Past Medical History:  Diagnosis Date  . GERD 01/25/2007   Qualifier: Diagnosis of  By: [place] LPN, Maudie Mercury    . Hepatic steatosis   . Hyperlipidemia   . Liver hemangioma   . Osteopenia    6/17 T -2.1 Left hip  . Thyroid disease   . Trigeminal neuralgia 1997   Trigemninal Tumor- Left, [place]   Past Surgical History:  Procedure Laterality Date  .  trigeminal nerve tumor     numbness on left side of face  . ABDOMINAL HYSTERECTOMY    . BREAST REDUCTION SURGERY    . CATARACT EXTRACTION     bilateral  . CHOLECYSTECTOMY    . COLONOSCOPY  2008   Dr. Oneida Alar: poor prep, inadequate exam  . COLONOSCOPY WITH ESOPHAGOGASTRODUODENOSCOPY (EGD)  2009   Dr. Oneida Alar: good prep, normal TI, normal colon, hemorrhoids, gastritis, nodule in cardia (benign)  . EAR PINNA RECONSTRUCTION W/ RIB GRAFT     7 surgeries starting at age 63  . ESOPHAGOGASTRODUODENOSCOPY  2008   Dr. Oneida Alar: Reflux esophagitis, large hh, gastritis.   Marland Kitchen [name] EXCISION Right 09/19/2013   Procedure: EXCISION [name] FOOT;  Surgeon: Marcheta Grammes, DPM;  Location: [facility];  Service: Podiatry;  Laterality: Right;  . OSTECTOMY Right 09/19/2013   Procedure: OSTECTOMY;  Surgeon: Marcheta Grammes, DPM;  Location: [facility];  Service: Podiatry;  Laterality: Right;  . RETINAL DETACHMENT SURGERY     bilateral  . TONSILLECTOMY     Current  Outpatient Medications on File Prior to Visit  Medication Sig Dispense Refill  . amitriptyline (ELAVIL) 50 MG tablet TAKE 3 TABLETS BY MOUTH EVERY DAY AT BEDTIME 90 tablet 0  . atorvastatin (LIPITOR) 20 MG tablet Take 1 tablet (20 mg total) by mouth daily. 90 tablet 3  . escitalopram (LEXAPRO) 20 MG tablet Take 1 tablet by mouth once daily 90 tablet 2  . HYDROcodone-acetaminophen (NORCO) 5-325 MG tablet Take 1 tablet by mouth every 6 (six) hours as needed for moderate pain. 60 tablet 0  . levothyroxine (SYNTHROID) 112 MCG tablet Take 1 tablet by mouth once daily 90 tablet 0  . naproxen (NAPROSYN) 500 MG tablet TAKE ONE TABLET  BY MOUTH TWICE DAILY WITH MEALS 60 tablet 2  . pantoprazole (PROTONIX) 40 MG tablet Take 1 tablet by mouth twice daily 180 tablet 3  . alendronate (FOSAMAX) 70 MG tablet Take 1 tablet (70 mg total) by mouth every 7 (seven) days. Take with a full glass of water on an empty stomach. (Patient not taking: Reported on 05/14/2019) 4 tablet 11   No current facility-administered medications on file prior to visit.   Allergies  Allergen Reactions  . Sulfonamide Derivatives     REACTION: Joint Pain and locking - walks like a 74 year old   Social History   Socioeconomic History  . Marital status: Married    Spouse name: Not on file  . Number of children: 3  . Years of education: Not on file  . Highest education level: Not on file  Occupational History  . Occupation: PT - [employer]  Tobacco Use  . Smoking status: Current Every Day Smoker    Packs/day: 1.50  . Smokeless tobacco: Never Used  Substance and Sexual Activity  . Alcohol use: No  . Drug use: No  . Sexual activity: Not on file  Other Topics Concern  . Not on file  Social History Narrative  . Not on file   Social Determinants of Health   Financial Resource Strain:   . Difficulty of Paying Living Expenses: Not on file  Food Insecurity:   . Worried About Charity fundraiser in the Last Year: Not on file  . Ran  Out of Food in the Last Year: Not on file  Transportation Needs:   . Lack of Transportation (Medical): Not on file  . Lack of Transportation (Non-Medical): Not on file  Physical Activity:   . Days of Exercise per Week: Not on file  . Minutes of Exercise per Session: Not on file  Stress:   . Feeling of Stress : Not on file  Social Connections:   . Frequency of Communication with Friends and Family: Not on file  . Frequency of Social Gatherings with Friends and Family: Not on file  . Attends Religious Services: Not on file  . Active Member of Clubs or Organizations: Not on file  . Attends Archivist Meetings: Not on file  . Marital Status: Not on file  Intimate Partner Violence:   . Fear of Current or Ex-Partner: Not on file  . Emotionally Abused: Not on file  . Physically Abused: Not on file  . Sexually Abused: Not on file     Past Medical History:  Diagnosis Date  . GERD 01/25/2007   Qualifier: Diagnosis of  By: [place] LPN, Maudie Mercury    . Hepatic steatosis   . Hyperlipidemia   . Liver hemangioma   . Osteopenia    6/17 T -2.1 Left hip  . Thyroid disease   . Trigeminal neuralgia 1997   Trigemninal Tumor- Left, [place]   Past Surgical History:  Procedure Laterality Date  .  trigeminal nerve tumor     numbness on left side of face  . ABDOMINAL HYSTERECTOMY    . BREAST REDUCTION SURGERY    . CATARACT EXTRACTION     bilateral  . CHOLECYSTECTOMY    . COLONOSCOPY  2008   Dr. Oneida Alar: poor prep, inadequate exam  . COLONOSCOPY WITH ESOPHAGOGASTRODUODENOSCOPY (EGD)  2009   Dr. Oneida Alar: good prep, normal TI, normal colon, hemorrhoids, gastritis, nodule in cardia (benign)  . EAR PINNA RECONSTRUCTION W/ RIB GRAFT  7 surgeries starting at age 71  . ESOPHAGOGASTRODUODENOSCOPY  2008   Dr. Oneida Alar: Reflux esophagitis, large hh, gastritis.   Marland Kitchen [name] EXCISION Right 09/19/2013   Procedure: EXCISION [name] FOOT;  Surgeon: Marcheta Grammes, DPM;  Location: [facility];  Service: Podiatry;  Laterality: Right;  . OSTECTOMY Right 09/19/2013   Procedure: OSTECTOMY;  Surgeon: Marcheta Grammes, DPM;  Location: [facility];  Service: Podiatry;  Laterality: Right;  . RETINAL DETACHMENT SURGERY     bilateral  . TONSILLECTOMY     Current Outpatient Medications on File Prior to Visit  Medication Sig Dispense Refill  . amitriptyline (ELAVIL) 50 MG tablet TAKE 3 TABLETS BY MOUTH EVERY DAY AT BEDTIME 90 tablet 0  . atorvastatin (LIPITOR) 20 MG tablet Take 1 tablet (20 mg total) by mouth daily. 90 tablet 3  . escitalopram (LEXAPRO) 20 MG tablet Take 1 tablet by mouth once daily 90 tablet 2  . HYDROcodone-acetaminophen (NORCO) 5-325 MG tablet Take 1 tablet by mouth every 6 (six) hours as needed for moderate pain. 60 tablet 0  . levothyroxine (SYNTHROID) 112 MCG tablet Take 1 tablet by mouth once daily 90 tablet 0  . naproxen (NAPROSYN) 500 MG tablet TAKE ONE TABLET BY MOUTH TWICE DAILY WITH MEALS 60 tablet 2  . pantoprazole (PROTONIX) 40 MG tablet Take 1 tablet by mouth twice daily 180 tablet 3  . alendronate (FOSAMAX) 70 MG tablet Take 1 tablet (70 mg total) by mouth every 7 (seven) days. Take with a full glass of water on an empty stomach. (Patient not taking: Reported on 05/14/2019) 4 tablet 11   No current facility-administered medications on file prior to visit.   Allergies  Allergen Reactions  . Sulfonamide Derivatives     REACTION: Joint Pain and locking - walks like a 74 year old   Social History   Socioeconomic History  . Marital status: Married    Spouse name: Not on file  . Number of children: 3  . Years of education: Not on file  . Highest education level: Not on file  Occupational History  . Occupation: PT - [employer]  Tobacco Use  . Smoking status: Current Every Day Smoker    Packs/day: 1.50  . Smokeless tobacco: Never Used  Substance and Sexual Activity  . Alcohol use: No  . Drug use: No  . Sexual activity: Not  on file  Other Topics Concern  . Not on file  Social History Narrative  . Not on file   Social Determinants of Health   Financial Resource Strain:   . Difficulty of Paying Living Expenses: Not on file  Food Insecurity:   . Worried About Charity fundraiser in the Last Year: Not on file  . Ran Out of Food in the Last Year: Not on file  Transportation Needs:   . Lack of Transportation (Medical): Not on file  . Lack of Transportation (Non-Medical): Not on file  Physical Activity:   . Days of Exercise per Week: Not on file  . Minutes of Exercise per Session: Not on file  Stress:   . Feeling of Stress : Not on file  Social Connections:   . Frequency of Communication with Friends and Family: Not on file  . Frequency of Social Gatherings with Friends and Family: Not on file  . Attends Religious Services: Not on file  . Active Member of Clubs or Organizations: Not on file  . Attends Archivist Meetings: Not on file  .  Marital Status: Not on file  Intimate Partner Violence:   . Fear of Current or Ex-Partner: Not on file  . Emotionally Abused: Not on file  . Physically Abused: Not on file  . Sexually Abused: Not on file      Review of Systems  All other systems reviewed and are negative.      Objective:   Physical Exam  Constitutional: She appears well-developed and well-nourished. No distress.  Cardiovascular: Normal rate, regular rhythm and normal heart sounds. Exam reveals no gallop.  No murmur heard. Pulmonary/Chest: Effort normal. No respiratory distress. She has no wheezes. She has no rhonchi. She has no rales.  Musculoskeletal:     Left shoulder: Pain present. No deformity, effusion, tenderness, bony tenderness, crepitus or spasms. Normal range of motion. Normal strength.       Arms:     Cervical back: No tenderness or bony tenderness. Normal range of motion.     Lumbar back: No spasms.  Neurological: She has normal reflexes. She exhibits normal muscle  tone. Coordination normal.  Skin: She is not diaphoretic.  Vitals reviewed.         Assessment & Plan:  Acute pain of left shoulder - Plan: DG Shoulder Left  Based on her normal shoulder exam, I believe the pain in her shoulder may actually be referred pain cervical radiculopathy.  I will obtain an x-ray of left shoulder to evaluate further.  Meanwhile start the patient on a prednisone taper pack for possible cervical radiculopathy.  I did refill 60 tablets of her hydrocodone.  However I cautioned the patient that I do not want to continue to use pain medication for her lower back pain.  If she continues to have pain in her lower back I would want to send her to see an orthopedist.

## 2019-06-03 ENCOUNTER — Other Ambulatory Visit: Payer: Self-pay | Admitting: Family Medicine

## 2019-06-11 DIAGNOSIS — Z23 Encounter for immunization: Secondary | ICD-10-CM | POA: Diagnosis not present

## 2019-06-14 ENCOUNTER — Other Ambulatory Visit: Payer: Self-pay | Admitting: Family Medicine

## 2019-07-02 ENCOUNTER — Other Ambulatory Visit: Payer: Self-pay | Admitting: Family Medicine

## 2019-07-16 ENCOUNTER — Other Ambulatory Visit: Payer: Self-pay | Admitting: Family Medicine

## 2019-07-18 ENCOUNTER — Other Ambulatory Visit: Payer: Self-pay

## 2019-07-18 ENCOUNTER — Ambulatory Visit (INDEPENDENT_AMBULATORY_CARE_PROVIDER_SITE_OTHER): Payer: Medicare Other | Admitting: Family Medicine

## 2019-07-18 ENCOUNTER — Encounter: Payer: Self-pay | Admitting: Family Medicine

## 2019-07-18 VITALS — BP 140/80 | HR 93 | Temp 97.0°F | Resp 18 | Ht 68.0 in | Wt 173.0 lb

## 2019-07-18 DIAGNOSIS — M545 Low back pain, unspecified: Secondary | ICD-10-CM

## 2019-07-18 DIAGNOSIS — R296 Repeated falls: Secondary | ICD-10-CM

## 2019-07-18 DIAGNOSIS — G3281 Cerebellar ataxia in diseases classified elsewhere: Secondary | ICD-10-CM | POA: Diagnosis not present

## 2019-07-18 DIAGNOSIS — C719 Malignant neoplasm of brain, unspecified: Secondary | ICD-10-CM | POA: Diagnosis not present

## 2019-07-18 DIAGNOSIS — R270 Ataxia, unspecified: Secondary | ICD-10-CM

## 2019-07-18 MED ORDER — CELECOXIB 200 MG PO CAPS: 200.0000 mg | ORAL_CAPSULE | Freq: Two times a day (BID) | ORAL | 2 refills | Status: DC

## 2019-07-18 NOTE — Progress Notes (Signed)
Subjective:    Patient ID: Shari Branch, female    DOB: [birth year], 74 y.o.   MRN: [id number]  12/25/18 About 10 or 11 days ago, the patient slipped while she was standing in the bathroom on some water.  She fell directly on her tailbone from a standing height.  The floor is hard.  She instantly felt pain radiate up from her tailbone all the way to her neck.  She now has constant bandlike pain in her lower back roughly at the level of L4 and L5 that wraps around both iliac crest to the front.  The pain is constant.  Is made worse by movement and by prolonged standing.  She denies any bowel or bladder incontinence.  She denies any saddle anesthesia.  She denies any lumbar radiculopathy or sciatica.  She denies any leg weakness or numbness.  She has normal reflexes checked the patella and Achilles.  She has normal muscle strength in both legs.  At that time, my plan was: I am concerned about a possible vertebral fracture.  I recommended an x-ray of the lumbar spine to evaluate further.  Meanwhile treat the pain with hydrocodone/acetaminophen 5/325 1 p.o. every 6 hours as needed pain.  The patient is already tried naproxen with no relief.  01/22/19 X-ray does show a compression fracture at T12.  However the patient's pain seems lower than that.  She is also complaining of pain roughly around the level of L4 in a bandlike pattern.  The pain is intense.  She is having to take hydrocodone every 6 hours.  1 pill every 6 hours does not alleviate the pain.  She denies any symptoms of cauda equina syndrome.  She denies any bowel or bladder incontinence.  She denies any leg weakness.  She denies any leg numbness.  She denies any radicular symptoms.  However the pain is intense and is a stabbing pain every time she stands up.  It occurred suddenly approximately 6 weeks ago when the patient slipped as stated above.  Therefore this would be consistent with an acute injury which would go along with compression fracture  seen at T12.  Patient is interested in possible kyphoplasty.  At that time, my plan was: Patient's pain is lower than the location of the compression fracture.  Therefore I recommended an MRI of the lumbar spine.  Of asked if they can include T12 on the images as well.  This would give [place] a look at T12 as well as the lower spine where she reports the pain.  If the MRI confirms an acute vertebral fracture at T12 and there is no significant arthritis seen lower in the spine that would explain the patient's pain, I would refer the patient for possible kyphoplasty to help alleviate her pain as she is requiring an extensive use of opiates.  However if the compression fracture appears old or if there is another significant finding lower in the MRI at the level of L4-L5, the patient may benefit from seeing neurosurgery to discuss cortisone injections as the compression fracture at T12 may be a coincidental finding.  Hydrocodone was refilled and 60 tablets were given until we can obtain information on the MRI.  07/18/19  MRI- T12-L1: No significant spinal stenosis. Inferior endplate fracture 624THL with mild bony retropulsion into the canal  L1-2: Negative  L2-3: Mild disc bulging and mild facet degeneration. Negative for stenosis  L3-4: Mild disc degeneration. Small right paracentral disc protrusion at mild facet degeneration. No significant  spinal stenosis  L4-5: Disc degeneration with disc bulging and endplate spurring. Bilateral facet degeneration causing mild spinal stenosis and mild subarticular stenosis bilaterally.  L5-S1: Mild disc bulging. Mild facet degeneration. Mild subarticular stenosis bilaterally.  Patient is here today again reporting low back pain.  She is having to take the pain medication 1-2 times a day.  However the pain is located far lower than the location of the compression fracture at T12 and L1.  It is located in the center of her back roughly around the level of L5.   MRI of the lumbar spine in December revealed no significant abnormalities in this area other than some mild degenerative disc disease.  Patient has tenderness to palpation in the paraspinal muscles just above the coccyx.  She reports pain and stiffness upon standing.  She reports stiffness with walking and constant pain with walking.  She denies any sciatica or radiculopathy.  I do not believe this pain is secondary to the vertebral fracture.  Patient has not been taking Naprosyn due to the fact that the combination of the Naprosyn and the pain medication upsets her stomach.  She has not tried Celebrex.  She is also falling more frequently.  She recently rolled out of bed and fell and hit the floor striking her upper back on the bed.  She does not believe that the amitriptyline is contributing to her falls.  Instead she reports ataxia on a daily basis.  Even today as she stands up she staggers and has to hold onto the chair in the desk.  She staggers into the door frame as she walks.  I tried to perform Romberg testing however the patient could not put her feet together and stand erect without wavering in the air and almost losing her balance.  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  She denies any recent head trauma. Past Medical History:  Diagnosis Date  . GERD 01/25/2007   Qualifier: Diagnosis of  By: [place] LPN, Maudie Mercury    . Hepatic steatosis   . Hyperlipidemia   . Liver hemangioma   . Osteopenia    6/17 T -2.1 Left hip  . Thyroid disease   . Trigeminal neuralgia 1997   Trigemninal Tumor- Left, [place]   Past Surgical History:  Procedure Laterality Date  .  trigeminal nerve tumor     numbness on left side of face  . ABDOMINAL HYSTERECTOMY    . BREAST REDUCTION SURGERY    . CATARACT EXTRACTION     bilateral  . CHOLECYSTECTOMY    . COLONOSCOPY  2008   Dr. Oneida Alar: poor  prep, inadequate exam  . COLONOSCOPY WITH ESOPHAGOGASTRODUODENOSCOPY (EGD)  2009   Dr. Oneida Alar: good prep, normal TI, normal colon, hemorrhoids, gastritis, nodule in cardia (benign)  . EAR PINNA RECONSTRUCTION W/ RIB GRAFT     7 surgeries starting at age 31  . ESOPHAGOGASTRODUODENOSCOPY  2008   Dr. Oneida Alar: Reflux esophagitis, large hh, gastritis.   Marland Kitchen [name] EXCISION Right 09/19/2013   Procedure: EXCISION [name] FOOT;  Surgeon: Marcheta Grammes, DPM;  Location: [facility];  Service: Podiatry;  Laterality: Right;  . OSTECTOMY Right 09/19/2013   Procedure: OSTECTOMY;  Surgeon: Marcheta Grammes, DPM;  Location: [facility];  Service: Podiatry;  Laterality: Right;  . RETINAL DETACHMENT SURGERY     bilateral  . TONSILLECTOMY  Current Outpatient Medications on File Prior to Visit  Medication Sig Dispense Refill  . amitriptyline (ELAVIL) 50 MG tablet TAKE 3 TABLETS BY MOUTH EVERY DAY AT BEDTIME 90 tablet 0  . atorvastatin (LIPITOR) 20 MG tablet Take 1 tablet (20 mg total) by mouth daily. 90 tablet 3  . escitalopram (LEXAPRO) 20 MG tablet Take 1 tablet by mouth once daily 90 tablet 0  . HYDROcodone-acetaminophen (NORCO) 5-325 MG tablet Take 1 tablet by mouth every 6 (six) hours as needed for moderate pain. 60 tablet 0  . pantoprazole (PROTONIX) 40 MG tablet Take 1 tablet by mouth twice daily 180 tablet 3  . alendronate (FOSAMAX) 70 MG tablet Take 1 tablet (70 mg total) by mouth every 7 (seven) days. Take with a full glass of water on an empty stomach. (Patient not taking: Reported on 05/14/2019) 4 tablet 11  . naproxen (NAPROSYN) 500 MG tablet TAKE ONE TABLET BY MOUTH TWICE DAILY WITH MEALS (Patient not taking: Reported on 07/18/2019) 60 tablet 2   No current facility-administered medications on file prior to visit.   Allergies  Allergen Reactions  . Sulfonamide Derivatives     REACTION: Joint Pain and locking - walks like a 74 year old   Social History   Socioeconomic History   . Marital status: Married    Spouse name: Not on file  . Number of children: 3  . Years of education: Not on file  . Highest education level: Not on file  Occupational History  . Occupation: PT - [employer]  Tobacco Use  . Smoking status: Current Every Day Smoker    Packs/day: 1.50  . Smokeless tobacco: Never Used  Substance and Sexual Activity  . Alcohol use: No  . Drug use: No  . Sexual activity: Not on file  Other Topics Concern  . Not on file  Social History Narrative  . Not on file   Social Determinants of Health   Financial Resource Strain:   . Difficulty of Paying Living Expenses:   Food Insecurity:   . Worried About Charity fundraiser in the Last Year:   . Arboriculturist in the Last Year:   Transportation Needs:   . Film/video editor (Medical):   Marland Kitchen Lack of Transportation (Non-Medical):   Physical Activity:   . Days of Exercise per Week:   . Minutes of Exercise per Session:   Stress:   . Feeling of Stress :   Social Connections:   . Frequency of Communication with Friends and Family:   . Frequency of Social Gatherings with Friends and Family:   . Attends Religious Services:   . Active Member of Clubs or Organizations:   . Attends Archivist Meetings:   Marland Kitchen Marital Status:   Intimate Partner Violence:   . Fear of Current or Ex-Partner:   . Emotionally Abused:   Marland Kitchen Physically Abused:   . Sexually Abused:      Past Medical History:  Diagnosis Date  . GERD 01/25/2007   Qualifier: Diagnosis of  By: [place] LPN, Maudie Mercury    . Hepatic steatosis   . Hyperlipidemia   . Liver hemangioma   . Osteopenia    6/17 T -2.1 Left hip  . Thyroid disease   . Trigeminal neuralgia 1997   Trigemninal Tumor- Left, [place]   Past Surgical History:  Procedure Laterality Date  .  trigeminal nerve tumor     numbness on left side of face  . ABDOMINAL HYSTERECTOMY    .  BREAST REDUCTION SURGERY    . CATARACT EXTRACTION     bilateral  .  CHOLECYSTECTOMY    . COLONOSCOPY  2008   Dr. Oneida Alar: poor prep, inadequate exam  . COLONOSCOPY WITH ESOPHAGOGASTRODUODENOSCOPY (EGD)  2009   Dr. Oneida Alar: good prep, normal TI, normal colon, hemorrhoids, gastritis, nodule in cardia (benign)  . EAR PINNA RECONSTRUCTION W/ RIB GRAFT     7 surgeries starting at age 63  . ESOPHAGOGASTRODUODENOSCOPY  2008   Dr. Oneida Alar: Reflux esophagitis, large hh, gastritis.   Marland Kitchen [name] EXCISION Right 09/19/2013   Procedure: EXCISION [name] FOOT;  Surgeon: Marcheta Grammes, DPM;  Location: [facility];  Service: Podiatry;  Laterality: Right;  . OSTECTOMY Right 09/19/2013   Procedure: OSTECTOMY;  Surgeon: Marcheta Grammes, DPM;  Location: [facility];  Service: Podiatry;  Laterality: Right;  . RETINAL DETACHMENT SURGERY     bilateral  . TONSILLECTOMY     Current Outpatient Medications on File Prior to Visit  Medication Sig Dispense Refill  . amitriptyline (ELAVIL) 50 MG tablet TAKE 3 TABLETS BY MOUTH EVERY DAY AT BEDTIME 90 tablet 0  . atorvastatin (LIPITOR) 20 MG tablet Take 1 tablet (20 mg total) by mouth daily. 90 tablet 3  . escitalopram (LEXAPRO) 20 MG tablet Take 1 tablet by mouth once daily 90 tablet 0  . HYDROcodone-acetaminophen (NORCO) 5-325 MG tablet Take 1 tablet by mouth every 6 (six) hours as needed for moderate pain. 60 tablet 0  . pantoprazole (PROTONIX) 40 MG tablet Take 1 tablet by mouth twice daily 180 tablet 3  . alendronate (FOSAMAX) 70 MG tablet Take 1 tablet (70 mg total) by mouth every 7 (seven) days. Take with a full glass of water on an empty stomach. (Patient not taking: Reported on 05/14/2019) 4 tablet 11  . naproxen (NAPROSYN) 500 MG tablet TAKE ONE TABLET BY MOUTH TWICE DAILY WITH MEALS (Patient not taking: Reported on 07/18/2019) 60 tablet 2   No current facility-administered medications on file prior to visit.   Allergies  Allergen Reactions  . Sulfonamide Derivatives     REACTION: Joint Pain and locking - walks like  a 74 year old   Social History   Socioeconomic History  . Marital status: Married    Spouse name: Not on file  . Number of children: 3  . Years of education: Not on file  . Highest education level: Not on file  Occupational History  . Occupation: PT - [employer]  Tobacco Use  . Smoking status: Current Every Day Smoker    Packs/day: 1.50  . Smokeless tobacco: Never Used  Substance and Sexual Activity  . Alcohol use: No  . Drug use: No  . Sexual activity: Not on file  Other Topics Concern  . Not on file  Social History Narrative  . Not on file   Social Determinants of Health   Financial Resource Strain:   . Difficulty of Paying Living Expenses:   Food Insecurity:   . Worried About Charity fundraiser in the Last Year:   . Arboriculturist in the Last Year:   Transportation Needs:   . Film/video editor (Medical):   Marland Kitchen Lack of Transportation (Non-Medical):   Physical Activity:   . Days of Exercise per Week:   . Minutes of Exercise per Session:   Stress:   . Feeling of Stress :   Social Connections:   . Frequency of Communication with Friends and Family:   . Frequency  of Social Gatherings with Friends and Family:   . Attends Religious Services:   . Active Member of Clubs or Organizations:   . Attends Archivist Meetings:   Marland Kitchen Marital Status:   Intimate Partner Violence:   . Fear of Current or Ex-Partner:   . Emotionally Abused:   Marland Kitchen Physically Abused:   . Sexually Abused:       Review of Systems  All other systems reviewed and are negative.      Objective:   Physical Exam  Constitutional: She is oriented to person, place, and time. She appears well-developed and well-nourished. No distress.  Cardiovascular: Normal rate, regular rhythm and normal heart sounds. Exam reveals no gallop.  No murmur heard. Pulmonary/Chest: Effort normal. No respiratory distress. She has no wheezes. She has no rhonchi. She has no rales.  Musculoskeletal:     Lumbar back:  Pain and tenderness present. No spasms or bony tenderness. Decreased range of motion.       Back:  Neurological: She is alert and oriented to person, place, and time. She has normal reflexes. She displays no atrophy. No cranial nerve deficit or sensory deficit. She exhibits normal muscle tone. Coordination and gait abnormal.  Skin: She is not diaphoretic.  Vitals reviewed.         Assessment & Plan:  Frequent falls - Plan: Ambulatory referral to Physical Therapy, MR Brain W Wo Contrast  Ataxia - Plan: MR Brain W Wo Contrast  Ependymoma (HCC) - Plan: MR Brain W Wo Contrast  Acute bilateral low back pain without sciatica - Plan: Ambulatory referral to Physical Therapy  Cerebellar ataxia in diseases classified elsewhere [facility]) - Plan: MR Brain W Wo Contrast  I do not believe the pain in her lower back is due to the compression fracture.  Instead I believe is most likely muscular coupled with some mild arthritic changes.  Therefore rather than just continue to use pain medication I recommended a referral to physical therapy.  I would also start Celebrex 200 mg twice a day as an anti-inflammatory to see if we can better manage her pain rather than simply masking it with hydrocodone twice a day.  I am concerned about her ataxia and more frequent falls.  I have recommended an MRI of the brain to reassess the mass that she is known to have in the left lateral ventricle and also to rule out CVA.  Patient is comfortable with this plan.

## 2019-07-19 ENCOUNTER — Encounter: Payer: Self-pay | Admitting: Family Medicine

## 2019-08-01 ENCOUNTER — Other Ambulatory Visit: Payer: Self-pay

## 2019-08-01 ENCOUNTER — Ambulatory Visit (HOSPITAL_COMMUNITY): Payer: Medicare Other | Attending: Family Medicine | Admitting: Physical Therapy

## 2019-08-01 ENCOUNTER — Encounter (HOSPITAL_COMMUNITY): Payer: Self-pay | Admitting: Physical Therapy

## 2019-08-01 DIAGNOSIS — M545 Low back pain, unspecified: Secondary | ICD-10-CM

## 2019-08-01 DIAGNOSIS — R2689 Other abnormalities of gait and mobility: Secondary | ICD-10-CM | POA: Diagnosis not present

## 2019-08-01 DIAGNOSIS — M6281 Muscle weakness (generalized): Secondary | ICD-10-CM | POA: Insufficient documentation

## 2019-08-01 DIAGNOSIS — G8929 Other chronic pain: Secondary | ICD-10-CM | POA: Insufficient documentation

## 2019-08-01 DIAGNOSIS — R2681 Unsteadiness on feet: Secondary | ICD-10-CM

## 2019-08-02 NOTE — Therapy (Signed)
[place], [place], [address] Phone: [phone]   Fax:  [phone]  Physical Therapy Evaluation  Patient Details  Name: Shari Branch MRN: [id number] Date of Birth: November 25, 1945 Referring Provider (PT): [name], MD   Encounter Date: 08/01/2019  PT End of Session - 08/01/19 0959    Visit Number  1    Number of Visits  12    Date for PT Re-Evaluation  09/13/19    Authorization Type  Primary: Medicare; Secondary: BCBS 70 visit limit PT/OT/ST    Authorization - Visit Number  1    Authorization - Number of Visits  60    Progress Note Due on Visit  10    PT Start Time  [id number]    PT Stop Time  1645    PT Time Calculation (min)  40 min    Equipment Utilized During Treatment  Gait belt    Activity Tolerance  Patient tolerated treatment well    Behavior During Therapy  WFL for tasks assessed/performed       Past Medical History:  Diagnosis Date  . GERD 01/25/2007   Qualifier: Diagnosis of  By: [place] LPN, Maudie Mercury    . Hepatic steatosis   . Hyperlipidemia   . Liver hemangioma   . Osteopenia    6/17 T -2.1 Left hip  . Thyroid disease   . Trigeminal neuralgia 1997   Trigemninal Tumor- Left, [place]    Past Surgical History:  Procedure Laterality Date  .  trigeminal nerve tumor     numbness on left side of face  . ABDOMINAL HYSTERECTOMY    . BREAST REDUCTION SURGERY    . CATARACT EXTRACTION     bilateral  . CHOLECYSTECTOMY    . COLONOSCOPY  2008   Dr. Oneida Alar: poor prep, inadequate exam  . COLONOSCOPY WITH ESOPHAGOGASTRODUODENOSCOPY (EGD)  2009   Dr. Oneida Alar: good prep, normal TI, normal colon, hemorrhoids, gastritis, nodule in cardia (benign)  . EAR PINNA RECONSTRUCTION W/ RIB GRAFT     7 surgeries starting at age 53  . ESOPHAGOGASTRODUODENOSCOPY  2008   Dr. Oneida Alar: Reflux esophagitis, large hh, gastritis.   Marland Kitchen [name] EXCISION Right 09/19/2013   Procedure: EXCISION GANGLION  CYST FOOT;  Surgeon: Marcheta Grammes, DPM;  Location: [facility];  Service: Podiatry;  Laterality: Right;  . OSTECTOMY Right 09/19/2013   Procedure: OSTECTOMY;  Surgeon: Marcheta Grammes, DPM;  Location: [facility];  Service: Podiatry;  Laterality: Right;  . RETINAL DETACHMENT SURGERY     bilateral  . TONSILLECTOMY      There were no vitals filed for this visit.   Subjective Assessment - 08/01/19 1605    Subjective  Patient reported that she fell and injured her back about 3 months ago when she slipped on water. She stated that following this fall it has been a ripple effect of problems. She stated that she has arthritis in her low back. Patient reported that she has a history of vertigo. Patient reported a history of benign tumors and trigeminal neuralgia. 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 She reported that the MD suspects she may have another tumor in her brain or possibly a TIA and plans to do an MRI to assess this. In terms of her back pain, patient reported that sweeping bothers it, but mopping does not. Patient reported that although she does have back pain, she would rather focus on  her balance. 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 She stated that she has had difficulty with her balance since she was a child due to this.    Pertinent History  Fall 3 months ago    Limitations  House hold activities;Standing;Walking    Diagnostic tests  MRI lumbar spine 02/15/19: mild fracture inferior endplate T12 mild superior endplate fracture L1; planning to get an MRI of brain    Patient Stated Goals  Balance    Currently in Pain?  Yes    Pain Score  3     Pain Location  Back    Pain Orientation  Lower;Medial    Pain Descriptors / Indicators  Aching    Pain Type  Chronic pain    Pain Onset  More than a month ago    Pain Frequency  Intermittent    Aggravating Factors   Heavy lifting    Pain Relieving Factors  Taking the antinflammatory  medication and getting in hot water.             08/01/19 0001  Assessment  Medical Diagnosis Acute LBP w/o sciatica, Frequent falls  Referring Provider (PT) [name], MD  Onset Date/Surgical Date  (About 04/2019)  Next MD Visit  (After 08/09/19)  Prior Therapy Yes  Precautions  Precautions Back  Precaution Comments No heavy lifting  Restrictions  Weight Bearing Restrictions No  Balance Screen  Has the patient fallen in the past 6 months Yes  How many times? 2  Has the patient had a decrease in activity level because of a fear of falling?  No  Is the patient reluctant to leave their home because of a fear of falling?  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 Limits for tasks assessed  ROM / Strength  AROM / PROM / Strength Strength;AROM  AROM  AROM Assessment Site Lumbar  Lumbar Flexion 25% limited  Lumbar Extension 50% limited (LOB)  Lumbar - Right Side Bend WFL (LOB)  Lumbar - Left Side Bend WFL (LOB)  Lumbar - Right Rotation WFL  Lumbar - Left Rotation [facility]  Strength  Strength Assessment Site Hip;Knee;Ankle  Right/Left Hip Right;Left  Right/Left Knee Right;Left  Right/Left Ankle Right;Left  Right Hip Flexion 4+/5  Right Hip Extension 4/5  Right Hip ABduction 4+/5  Left Hip Flexion 4/5  Left Hip Extension 4/5  Left Hip ABduction 4+/5  Right Knee Flexion 4+/5  Right Knee Extension 4+/5  Left Knee Flexion 4+/5  Left Knee Extension 4+/5  Right Ankle Dorsiflexion 4+/5  Left Ankle Dorsiflexion 4+/5  Standardized Balance Assessment  Standardized Balance Assessment Dynamic Gait Index  Dynamic Gait Index  Level Surface 3   Change in Gait Speed 2  Gait with Horizontal Head Turns 1  Gait with Vertical Head Turns 1  Gait and Pivot Turn 2  Step Over Obstacle 1  Step Around Obstacles 3  Steps 2  Total Score 15              Objective measurements completed on examination: See above findings.              PT Education - 08/01/19 [id number]  Education Details  Discussed examination findings and POC.    Person(s) Educated  Patient    Methods  Explanation    Comprehension  Verbalized understanding       PT Short Term Goals - 08/01/19 1003      PT SHORT TERM GOAL #1   Title  Patient will report understanding and regular compliance with HEP to improve balance and overall functional mobility.    Time  3    Period  Weeks    Status  New    Target Date  08/22/19        PT Long Term Goals - 08/01/19 1004      PT LONG TERM GOAL #1   Title  Patient will demonstrate an improvement on the DGI of at least 3 points indicating improved balance and decreased risk of falls.    Time  6    Period  Weeks    Status  New    Target Date  09/12/19      PT LONG TERM GOAL #2   Title  Patient will report an overall improvement in balance of at least 50% for improved confidence performing daily activities.    Time  6    Period  Weeks    Status  New    Target Date  09/12/19      PT LONG TERM GOAL #3   Title  Patient will demonstrate at least 4+/5 MMT strength in all LE muscle groups tested in order to improve functional mobility.    Time  6    Period  Weeks    Status  New    Target Date  09/12/19             Plan - 08/01/19 1018    Clinical Impression Statement  Patient is a 74 year old female who presents to outpatient physical therapy with primary concern of falling and balance deficits. Patient does report back pain, but states she would like to focus on balance at this time. Patient did have a fall and MRI of lumbar spine did demonstrate mild fractures at T12 and L1. 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 Upon examination, noted decreased lower extremity strength, and decreased balance with the DGI. Patient demonstrated some limitations, in lumbar spine AROM. Patient's balance appears most limited with head turns or turning around. Patient would benefit from continued skilled physical therapy in order to address the abovementioned deficits in order to improve the patient's overall functional independence.    Personal Factors and Comorbidities  Age;Time since onset of injury/illness/exacerbation;Comorbidity 3+    Comorbidities  See above    Examination-Activity Limitations  Stand;Locomotion Level;Bend    Examination-Participation Restrictions  Yard Work;Cleaning;Meal Prep    Stability/Clinical Decision Making  Evolving/Moderate complexity    Clinical Decision Making  Moderate    Rehab Potential  Fair    PT Frequency  2x / week    PT Duration  6 weeks    PT Treatment/Interventions  ADLs/Self Care Home Management;Aquatic Therapy;Cryotherapy;Electrical Stimulation;Moist Heat;DME Instruction;Gait training;Stair training;Functional mobility training;Therapeutic activities;Therapeutic exercise;Balance training;Neuromuscular re-education;Patient/family education;Orthotic Fit/Training;Passive range of motion;Dry needling;Energy conservation;Taping    PT Next Visit Plan  Review eval/goals. Perform FOTO. Focus on dynamic balance particularly with head turns and full body turns. Provide HEP: Tandem stance near counter, STS.    PT Home Exercise Plan  Initiate at  first treatment session    Consulted and Agree with Plan of Care  Patient       Patient will benefit from skilled therapeutic intervention in order to improve the following deficits and impairments:  Abnormal gait, Dizziness, Pain, Decreased mobility, Decreased strength,  Decreased balance, Difficulty walking  Visit Diagnosis: Unsteadiness on feet  Other abnormalities of gait and mobility  Muscle weakness (generalized)  Chronic bilateral low back pain, unspecified whether sciatica present     Problem List Patient Active Problem List   Diagnosis Date Noted  . Constipation 02/17/2014  . Liver hemangioma   . Chest pain 10/29/2012  . Bacterial conjunctivitis of left eye 09/17/2012  . 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  . COLITIS 09/16/2008  . Nonalcoholic steatohepatitis (NASH) 09/16/2008  . SMOKER 04/28/2008  . TINNITUS, LEFT 04/28/2008  . HIP, ARTHRITIS, DEGEN./OSTEO 11/28/2007  . GASTRITIS 02/15/2007  . OSTEOPENIA 02/15/2007  . HYPOTHYROIDISM 01/25/2007  . HYPERLIPIDEMIA 01/25/2007  . MIGRAINE HEADACHE 01/25/2007  . 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  . CATARACTS 01/25/2007  . ALLERGIC RHINITIS 01/25/2007  . GERD (gastroesophageal reflux disease) 01/25/2007  . MALAISE 01/25/2007  . INCONTINENCE 01/25/2007   Clarene Critchley PT, DPT 10:23 AM, 08/02/19 [place] [address] [place], [place], [address] Phone: [phone]   Fax:  [phone]  Name: Shari Branch MRN: [id number] Date of Birth: 1945/08/19

## 2019-08-07 ENCOUNTER — Encounter (HOSPITAL_COMMUNITY): Payer: Self-pay | Admitting: Physical Therapy

## 2019-08-07 ENCOUNTER — Other Ambulatory Visit: Payer: Self-pay

## 2019-08-07 ENCOUNTER — Ambulatory Visit (HOSPITAL_COMMUNITY): Payer: Medicare Other | Attending: Family Medicine | Admitting: Physical Therapy

## 2019-08-07 DIAGNOSIS — M545 Low back pain, unspecified: Secondary | ICD-10-CM

## 2019-08-07 DIAGNOSIS — M6281 Muscle weakness (generalized): Secondary | ICD-10-CM | POA: Diagnosis present

## 2019-08-07 DIAGNOSIS — G8929 Other chronic pain: Secondary | ICD-10-CM | POA: Diagnosis present

## 2019-08-07 DIAGNOSIS — R2681 Unsteadiness on feet: Secondary | ICD-10-CM | POA: Diagnosis present

## 2019-08-07 DIAGNOSIS — R2689 Other abnormalities of gait and mobility: Secondary | ICD-10-CM | POA: Insufficient documentation

## 2019-08-07 NOTE — Therapy (Signed)
[place], [place], [address] Phone: [phone]   Fax:  [phone]  Physical Therapy Treatment  Patient Details  Name: Shari Branch MRN: [id number] Date of Birth: 1945/09/14 Referring Provider (PT): [name], MD   Encounter Date: 08/07/2019  PT End of Session - 08/07/19 1043    Visit Number  2    Number of Visits  12    Date for PT Re-Evaluation  09/13/19    Authorization Type  Primary: Medicare; Secondary: BCBS 50 visit limit PT/OT/ST    Authorization - Visit Number  2    Authorization - Number of Visits  60    Progress Note Due on Visit  10    PT Start Time  1044   arrives late   PT Stop Time  1115    PT Time Calculation (min)  31 min    Equipment Utilized During Treatment  Gait belt    Activity Tolerance  Patient tolerated treatment well    Behavior During Therapy  [facility] for tasks assessed/performed       Past Medical History:  Diagnosis Date   GERD 01/25/2007   Qualifier: Diagnosis of  By: [place] LPN, Kim     Hepatic steatosis    Hyperlipidemia    Liver hemangioma    Osteopenia    6/17 T -2.1 Left hip   Thyroid disease    Trigeminal neuralgia 1997   Trigemninal Tumor- Left, [place]    Past Surgical History:  Procedure Laterality Date    trigeminal nerve tumor     numbness on left side of face   ABDOMINAL HYSTERECTOMY     BREAST REDUCTION SURGERY     CATARACT EXTRACTION     bilateral   CHOLECYSTECTOMY     COLONOSCOPY  2008   Dr. Oneida Alar: poor prep, inadequate exam   COLONOSCOPY WITH ESOPHAGOGASTRODUODENOSCOPY (EGD)  2009   Dr. Oneida Alar: good prep, normal TI, normal colon, hemorrhoids, gastritis, nodule in cardia (benign)   EAR PINNA RECONSTRUCTION W/ RIB GRAFT     7 surgeries starting at age 20   ESOPHAGOGASTRODUODENOSCOPY  2008   Dr. Oneida Alar: Reflux esophagitis, large hh, gastritis.    [name] EXCISION Right 09/19/2013   Procedure:  EXCISION [name] FOOT;  Surgeon: Marcheta Grammes, DPM;  Location: [facility];  Service: Podiatry;  Laterality: Right;   OSTECTOMY Right 09/19/2013   Procedure: OSTECTOMY;  Surgeon: Marcheta Grammes, DPM;  Location: [facility];  Service: Podiatry;  Laterality: Right;   RETINAL DETACHMENT SURGERY     bilateral   TONSILLECTOMY      There were no vitals filed for this visit.  Subjective Assessment - 08/07/19 1043    Subjective  Patient has not had any falls. Nothing new to report. Her back is a little sore from doing a lot of work yesterday.    Pertinent History  Fall 3 months ago    Limitations  House hold activities;Standing;Walking    Diagnostic tests  MRI lumbar spine 02/15/19: mild fracture inferior endplate T12 mild superior endplate fracture L1; planning to get an MRI of brain    Patient Stated Goals  Balance    Currently in Pain?  Yes    Pain Score  3     Pain Location  Back    Pain Onset  More than a month ago         [facility] PT Assessment - 08/07/19 0001  Observation/Other Assessments   Focus on Therapeutic Outcomes (FOTO)   29% limited                    [facility] Adult PT Treatment/Exercise - 08/07/19 0001      Exercises   Exercises  Knee/Hip      Knee/Hip Exercises: Standing   Other Standing Knee Exercises  gait with head turns and body turns 10 x 50 feet    Other Standing Knee Exercises  tandem balance 2x30 seconds bilateral      Knee/Hip Exercises: Seated   Sit to Sand  1 set;10 reps;without UE support             PT Education - 08/07/19 1043    Education Details  Educated on [employer]) Educated  Patient    Methods  Explanation;Demonstration    Comprehension  Verbalized understanding;Returned demonstration       PT Short Term Goals - 08/07/19 1048      PT SHORT TERM GOAL #1   Title  Patient will report understanding and regular compliance with HEP to improve balance and overall functional mobility.    Time  3     Period  Weeks    Status  On-going    Target Date  08/22/19        PT Long Term Goals - 08/07/19 1048      PT LONG TERM GOAL #1   Title  Patient will demonstrate an improvement on the DGI of at least 3 points indicating improved balance and decreased risk of falls.    Time  6    Period  Weeks    Status  On-going      PT LONG TERM GOAL #2   Title  Patient will report an overall improvement in balance of at least 50% for improved confidence performing daily activities.    Time  6    Period  Weeks    Status  On-going      PT LONG TERM GOAL #3   Title  Patient will demonstrate at least 4+/5 MMT strength in all LE muscle groups tested in order to improve functional mobility.    Time  6    Period  Weeks    Status  On-going            Plan - 08/07/19 1044    Clinical Impression Statement  Reviewed goals and completed lumbar FOTO with patient. She demonstrates significantly impaired static balance with head turns with greatest difficulty looking up and also to the right. She also demonstrates impaired dynamic balance with turning with cueing and has greatest difficulty turning to right but c/o dizziness bilateral. She fatigues quickly with sit to stand exercise and demonstrates dynamic knee valgus bilaterally secondary to impaired hip strength. She shows impaired static balance as well as evidence of mod/severe sway with tandem balance exercise. Patient will continue to benefit from skilled physical therapy in order to reduce impairment and improve function. Session shortened secondary to patient arriving late.    Personal Factors and Comorbidities  Age;Time since onset of injury/illness/exacerbation;Comorbidity 3+    Comorbidities  See above    Examination-Activity Limitations  Stand;Locomotion Level;Bend    Examination-Participation Restrictions  Yard Work;Cleaning;Meal Prep    Stability/Clinical Decision Making  Evolving/Moderate complexity    Rehab Potential  Fair    PT Frequency   2x / week    PT Duration  6 weeks    PT Treatment/Interventions  ADLs/Self Care Home Management;Aquatic Therapy;Cryotherapy;Electrical Stimulation;Moist Heat;DME Instruction;Gait training;Stair training;Functional mobility training;Therapeutic activities;Therapeutic exercise;Balance training;Neuromuscular re-education;Patient/family education;Orthotic Fit/Training;Passive range of motion;Dry needling;Energy conservation;Taping    PT Next Visit Plan  Focus on dynamic balance particularly with head turns and full body turns. Provide HEP: continue LE strengtheing and balance    PT Home Exercise Plan  08/07/19 sit to stand, tandem stance at sink    Consulted and Agree with Plan of Care  Patient       Patient will benefit from skilled therapeutic intervention in order to improve the following deficits and impairments:  Abnormal gait, Dizziness, Pain, Decreased mobility, Decreased strength, Decreased balance, Difficulty walking  Visit Diagnosis: Unsteadiness on feet  Other abnormalities of gait and mobility  Muscle weakness (generalized)  Chronic bilateral low back pain, unspecified whether sciatica present     Problem List Patient Active Problem List   Diagnosis Date Noted   Constipation 02/17/2014   Liver hemangioma    Chest pain 10/29/2012   Bacterial conjunctivitis of left eye 09/17/2012   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   COLITIS 0000000   Nonalcoholic steatohepatitis (NASH) 09/16/2008   SMOKER 04/28/2008   TINNITUS, LEFT 04/28/2008   HIP, ARTHRITIS, DEGEN./OSTEO 11/28/2007   GASTRITIS 02/15/2007   OSTEOPENIA 02/15/2007   HYPOTHYROIDISM 01/25/2007   HYPERLIPIDEMIA 01/25/2007   MIGRAINE HEADACHE 01/25/2007   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   CATARACTS 01/25/2007   ALLERGIC RHINITIS 01/25/2007   GERD (gastroesophageal reflux disease) 01/25/2007   MALAISE 01/25/2007   INCONTINENCE 01/25/2007    11:19 AM, 08/07/19 Mearl Latin PT, DPT Physical Therapist at [place], [place], [address] Phone: [phone]   Fax:  [phone]  Name: Shari Branch MRN: [id number] Date of Birth: 10-08-1945

## 2019-08-09 ENCOUNTER — Ambulatory Visit (HOSPITAL_COMMUNITY)
Admission: RE | Admit: 2019-08-09 | Discharge: 2019-08-09 | Disposition: A | Discharge status: Home / Self Care | Payer: Medicare Other | Source: Ambulatory Visit | Attending: Family Medicine | Admitting: Family Medicine

## 2019-08-09 ENCOUNTER — Other Ambulatory Visit: Payer: Self-pay

## 2019-08-09 DIAGNOSIS — S0990XA Unspecified injury of head, initial encounter: Secondary | ICD-10-CM | POA: Diagnosis not present

## 2019-08-09 DIAGNOSIS — C719 Malignant neoplasm of brain, unspecified: Secondary | ICD-10-CM | POA: Insufficient documentation

## 2019-08-09 DIAGNOSIS — G3281 Cerebellar ataxia in diseases classified elsewhere: Secondary | ICD-10-CM | POA: Diagnosis not present

## 2019-08-09 DIAGNOSIS — R296 Repeated falls: Secondary | ICD-10-CM | POA: Insufficient documentation

## 2019-08-09 DIAGNOSIS — R270 Ataxia, unspecified: Secondary | ICD-10-CM | POA: Diagnosis not present

## 2019-08-09 MED ORDER — GADOBUTROL 1 MMOL/ML IV SOLN
7.0000 mL | Freq: Once | INTRAVENOUS | Status: AC | PRN
  Administered 2019-08-09: 7 mL via INTRAVENOUS

## 2019-08-12 ENCOUNTER — Ambulatory Visit (HOSPITAL_COMMUNITY): Payer: Medicare Other | Admitting: Physical Therapy

## 2019-08-12 ENCOUNTER — Other Ambulatory Visit: Payer: Self-pay

## 2019-08-12 DIAGNOSIS — R2689 Other abnormalities of gait and mobility: Secondary | ICD-10-CM

## 2019-08-12 DIAGNOSIS — G8929 Other chronic pain: Secondary | ICD-10-CM

## 2019-08-12 DIAGNOSIS — M6281 Muscle weakness (generalized): Secondary | ICD-10-CM

## 2019-08-12 DIAGNOSIS — R2681 Unsteadiness on feet: Secondary | ICD-10-CM

## 2019-08-12 NOTE — Therapy (Signed)
[place], [place], [address] Phone: [phone]   Fax:  [phone]  Physical Therapy Treatment  Patient Details  Name: Shari Branch MRN: [id number] Date of Birth: May 18, 1945 Referring Provider (PT): [name], MD   Encounter Date: 08/12/2019  PT End of Session - 08/12/19 1532    Visit Number  3    Number of Visits  12    Date for PT Re-Evaluation  09/13/19    Authorization Type  Primary: Medicare; Secondary: BCBS 82 visit limit PT/OT/ST    Authorization - Visit Number  3    Authorization - Number of Visits  60    Progress Note Due on Visit  10    PT Start Time  1452    PT Stop Time  1530    PT Time Calculation (min)  38 min    Equipment Utilized During Treatment  Gait belt    Activity Tolerance  Patient tolerated treatment well    Behavior During Therapy  [facility] for tasks assessed/performed       Past Medical History:  Diagnosis Date   GERD 01/25/2007   Qualifier: Diagnosis of  By: [place] LPN, Kim     Hepatic steatosis    Hyperlipidemia    Liver hemangioma    Osteopenia    6/17 T -2.1 Left hip   Thyroid disease    Trigeminal neuralgia 1997   Trigemninal Tumor- Left, [place]    Past Surgical History:  Procedure Laterality Date    trigeminal nerve tumor     numbness on left side of face   ABDOMINAL HYSTERECTOMY     BREAST REDUCTION SURGERY     CATARACT EXTRACTION     bilateral   CHOLECYSTECTOMY     COLONOSCOPY  2008   Dr. Oneida Alar: poor prep, inadequate exam   COLONOSCOPY WITH ESOPHAGOGASTRODUODENOSCOPY (EGD)  2009   Dr. Oneida Alar: good prep, normal TI, normal colon, hemorrhoids, gastritis, nodule in cardia (benign)   EAR PINNA RECONSTRUCTION W/ RIB GRAFT     7 surgeries starting at age 74   ESOPHAGOGASTRODUODENOSCOPY  2008   Dr. Oneida Alar: Reflux esophagitis, large hh, gastritis.    [name] EXCISION Right 09/19/2013   Procedure: EXCISION GANGLION  CYST FOOT;  Surgeon: Marcheta Grammes, DPM;  Location: [facility];  Service: Podiatry;  Laterality: Right;   OSTECTOMY Right 09/19/2013   Procedure: OSTECTOMY;  Surgeon: Marcheta Grammes, DPM;  Location: [facility];  Service: Podiatry;  Laterality: Right;   RETINAL DETACHMENT SURGERY     bilateral   TONSILLECTOMY      There were no vitals filed for this visit.  Subjective Assessment - 08/12/19 1512    Subjective  pt reports no issues today.  STates she did her HEP on Saturday but did not do them Sunday.                        [facility] Adult PT Treatment/Exercise - 08/12/19 0001      Knee/Hip Exercises: Standing   Heel Raises  Both;15 reps    Forward Lunges  Both;15 reps    Forward Lunges Limitations  onto 4" step with 1 UE assist    Hip Abduction  Both;10 reps    Hip Extension  Both;10 reps    Lateral Step Up  Both;10 reps    Forward Step Up  Both;10 reps    SLS with Vectors  5X3" each LE with 1  UE assist    Other Standing Knee Exercises  gait with head turns (lateral, up/down 200 feet) no AD    Other Standing Knee Exercises  tandem balance 2x30 seconds bilateral      Knee/Hip Exercises: Seated   Sit to Sand  1 set;10 reps;without UE support               PT Short Term Goals - 08/07/19 1048      PT SHORT TERM GOAL #1   Title  Patient will report understanding and regular compliance with HEP to improve balance and overall functional mobility.    Time  3    Period  Weeks    Status  On-going    Target Date  08/22/19        PT Long Term Goals - 08/07/19 1048      PT LONG TERM GOAL #1   Title  Patient will demonstrate an improvement on the DGI of at least 3 points indicating improved balance and decreased risk of falls.    Time  6    Period  Weeks    Status  On-going      PT LONG TERM GOAL #2   Title  Patient will report an overall improvement in balance of at least 50% for improved confidence performing daily activities.    Time  6     Period  Weeks    Status  On-going      PT LONG TERM GOAL #3   Title  Patient will demonstrate at least 4+/5 MMT strength in all LE muscle groups tested in order to improve functional mobility.    Time  6    Period  Weeks    Status  On-going            Plan - 08/12/19 1532    Clinical Impression Statement  continued with established therex with addition of LE strenghtening/stabilization exercises.  Pt required general cues for form including hold times and posturing with therex. Pt did not require any rest breaks during session.  Gait is more challenged by up/down head turns than lateral movements.  Pt with LOB each time but able to self correct.    Personal Factors and Comorbidities  Age;Time since onset of injury/illness/exacerbation;Comorbidity 3+    Comorbidities  See above    Examination-Activity Limitations  Stand;Locomotion Level;Bend    Examination-Participation Restrictions  Yard Work;Cleaning;Meal Prep    Stability/Clinical Decision Making  Evolving/Moderate complexity    Rehab Potential  Fair    PT Frequency  2x / week    PT Duration  6 weeks    PT Treatment/Interventions  ADLs/Self Care Home Management;Aquatic Therapy;Cryotherapy;Electrical Stimulation;Moist Heat;DME Instruction;Gait training;Stair training;Functional mobility training;Therapeutic activities;Therapeutic exercise;Balance training;Neuromuscular re-education;Patient/family education;Orthotic Fit/Training;Passive range of motion;Dry needling;Energy conservation;Taping    PT Next Visit Plan  Focus on dynamic balance particularly with head turns and full body turn.   continue LE strengtheing and balance; update HEP as needed.    PT Home Exercise Plan  08/07/19 sit to stand, tandem stance at sink    Consulted and Agree with Plan of Care  Patient       Patient will benefit from skilled therapeutic intervention in order to improve the following deficits and impairments:  Abnormal gait, Dizziness, Pain, Decreased  mobility, Decreased strength, Decreased balance, Difficulty walking  Visit Diagnosis: Unsteadiness on feet  Other abnormalities of gait and mobility  Muscle weakness (generalized)  Chronic bilateral low back pain, unspecified whether sciatica present  Problem List Patient Active Problem List   Diagnosis Date Noted   Constipation 02/17/2014   Liver hemangioma    Chest pain 10/29/2012   Bacterial conjunctivitis of left eye 09/17/2012   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   Lichen planus 03/28/2409   COLITIS 46/43/1427   Nonalcoholic steatohepatitis (NASH) 09/16/2008   SMOKER 04/28/2008   TINNITUS, LEFT 04/28/2008   HIP, ARTHRITIS, DEGEN./OSTEO 11/28/2007   GASTRITIS 02/15/2007   OSTEOPENIA 02/15/2007   HYPOTHYROIDISM 01/25/2007   HYPERLIPIDEMIA 01/25/2007   MIGRAINE HEADACHE 01/25/2007   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   CATARACTS 01/25/2007   ALLERGIC RHINITIS 01/25/2007   GERD (gastroesophageal reflux disease) 01/25/2007   MALAISE 01/25/2007   INCONTINENCE 01/25/2007   Teena Irani, PTA/CLT [phone]  Teena Irani 08/12/2019, 3:38 PM  [facility] [address] [place], [place], [address] Phone: [phone]   Fax:  [phone]  Name: Shari Branch MRN: [id number] Date of Birth: [birth year]

## 2019-08-14 ENCOUNTER — Other Ambulatory Visit: Payer: Self-pay

## 2019-08-14 ENCOUNTER — Encounter: Payer: Self-pay | Admitting: *Deleted

## 2019-08-14 ENCOUNTER — Ambulatory Visit (HOSPITAL_COMMUNITY): Payer: Medicare Other | Admitting: Physical Therapy

## 2019-08-14 DIAGNOSIS — G8929 Other chronic pain: Secondary | ICD-10-CM

## 2019-08-14 DIAGNOSIS — M6281 Muscle weakness (generalized): Secondary | ICD-10-CM

## 2019-08-14 DIAGNOSIS — R2681 Unsteadiness on feet: Secondary | ICD-10-CM

## 2019-08-14 DIAGNOSIS — R2689 Other abnormalities of gait and mobility: Secondary | ICD-10-CM

## 2019-08-14 NOTE — Therapy (Signed)
[place], [place], [address] Phone: [phone]   Fax:  [phone]  Physical Therapy Treatment  Patient Details  Name: Shari Branch MRN: [id number] Date of Birth: [birth year] Referring Provider (PT): [name], MD   Encounter Date: 08/14/2019  PT End of Session - 08/14/19 1532    Visit Number  4    Number of Visits  12    Date for PT Re-Evaluation  09/13/19    Authorization Type  Primary: Medicare; Secondary: BCBS 10 visit limit PT/OT/ST    Authorization - Visit Number  4    Authorization - Number of Visits  60    Progress Note Due on Visit  10    PT Start Time  1450    PT Stop Time  1533    PT Time Calculation (min)  43 min    Equipment Utilized During Treatment  Gait belt    Activity Tolerance  Patient tolerated treatment well    Behavior During Therapy  WFL for tasks assessed/performed       Past Medical History:  Diagnosis Date  . GERD 01/25/2007   Qualifier: Diagnosis of  By: [place] LPN, Maudie Mercury    . Hepatic steatosis   . Hyperlipidemia   . Liver hemangioma   . Osteopenia    6/17 T -2.1 Left hip  . Thyroid disease   . Trigeminal neuralgia 1997   Trigemninal Tumor- Left, [place]    Past Surgical History:  Procedure Laterality Date  .  trigeminal nerve tumor     numbness on left side of face  . ABDOMINAL HYSTERECTOMY    . BREAST REDUCTION SURGERY    . CATARACT EXTRACTION     bilateral  . CHOLECYSTECTOMY    . COLONOSCOPY  2008   Dr. Oneida Alar: poor prep, inadequate exam  . COLONOSCOPY WITH ESOPHAGOGASTRODUODENOSCOPY (EGD)  2009   Dr. Oneida Alar: good prep, normal TI, normal colon, hemorrhoids, gastritis, nodule in cardia (benign)  . EAR PINNA RECONSTRUCTION W/ RIB GRAFT     7 surgeries starting at age 43  . ESOPHAGOGASTRODUODENOSCOPY  2008   Dr. Oneida Alar: Reflux esophagitis, large hh, gastritis.   Marland Kitchen [name] EXCISION Right 09/19/2013   Procedure: EXCISION GANGLION  CYST FOOT;  Surgeon: Marcheta Grammes, DPM;  Location: [facility];  Service: Podiatry;  Laterality: Right;  . OSTECTOMY Right 09/19/2013   Procedure: OSTECTOMY;  Surgeon: Marcheta Grammes, DPM;  Location: [facility];  Service: Podiatry;  Laterality: Right;  . RETINAL DETACHMENT SURGERY     bilateral  . TONSILLECTOMY      There were no vitals filed for this visit.  Subjective Assessment - 08/14/19 1453    Subjective  pt states she feels much better today than the other day, however sore from the new exercises.    Currently in Pain?  No/denies                        [facility] Adult PT Treatment/Exercise - 08/14/19 0001      Knee/Hip Exercises: Standing   Heel Raises  Both;15 reps    Forward Lunges  Both;15 reps;2 sets    Forward Lunges Limitations  onto 4" step with 1 UE assist    Hip Abduction  Both;10 reps;2 sets    Hip Extension  Both;10 reps;2 sets    Lateral Step Up  Both;10 reps;Step Height: 4";Hand Hold: 1;2 sets    Forward Step Up  Both;10 reps;Step Height: 4";Hand Hold: 1;2 sets    SLS with Vectors  5X3" each LE with 1 UE assist    Other Standing Knee Exercises  gait with head turns (lateral, up/down 300 feet) no AD    Other Standing Knee Exercises  tandem balance 2x30 seconds bilateral      Knee/Hip Exercises: Seated   Sit to Sand  1 set;10 reps;without UE support               PT Short Term Goals - 08/07/19 1048      PT SHORT TERM GOAL #1   Title  Patient will report understanding and regular compliance with HEP to improve balance and overall functional mobility.    Time  3    Period  Weeks    Status  On-going    Target Date  08/22/19        PT Long Term Goals - 08/07/19 1048      PT LONG TERM GOAL #1   Title  Patient will demonstrate an improvement on the DGI of at least 3 points indicating improved balance and decreased risk of falls.    Time  6    Period  Weeks    Status  On-going      PT LONG TERM GOAL #2   Title  Patient  will report an overall improvement in balance of at least 50% for improved confidence performing daily activities.    Time  6    Period  Weeks    Status  On-going      PT LONG TERM GOAL #3   Title  Patient will demonstrate at least 4+/5 MMT strength in all LE muscle groups tested in order to improve functional mobility.    Time  6    Period  Weeks    Status  On-going            Plan - 08/14/19 1603    Clinical Impression Statement  Continued with therex with added set of most standing exercises.  Pt with improved form, control and less signs of fatigue with activity this session.  worked on head movements with gait today for longer period with most challenge today with lateral movements.  Pt without any issues or LOB this session.    Personal Factors and Comorbidities  Age;Time since onset of injury/illness/exacerbation;Comorbidity 3+    Comorbidities  See above    Examination-Activity Limitations  Stand;Locomotion Level;Bend    Examination-Participation Restrictions  Yard Work;Cleaning;Meal Prep    Stability/Clinical Decision Making  Evolving/Moderate complexity    Rehab Potential  Fair    PT Frequency  2x / week    PT Duration  6 weeks    PT Treatment/Interventions  ADLs/Self Care Home Management;Aquatic Therapy;Cryotherapy;Electrical Stimulation;Moist Heat;DME Instruction;Gait training;Stair training;Functional mobility training;Therapeutic activities;Therapeutic exercise;Balance training;Neuromuscular re-education;Patient/family education;Orthotic Fit/Training;Passive range of motion;Dry needling;Energy conservation;Taping    PT Next Visit Plan  Focus on dynamic balance particularly with head turns and full body turn.   continue LE strengtheing and balance; update HEP as needed.    PT Home Exercise Plan  08/07/19 sit to stand, tandem stance at sink    Consulted and Agree with Plan of Care  Patient       Patient will benefit from skilled therapeutic intervention in order to  improve the following deficits and impairments:  Abnormal gait, Dizziness, Pain, Decreased mobility, Decreased strength, Decreased balance, Difficulty walking  Visit Diagnosis: Unsteadiness on feet  Other abnormalities of gait and mobility  Muscle weakness (generalized)  Chronic bilateral low back pain, unspecified whether sciatica present     Problem List Patient Active Problem List   Diagnosis Date Noted  . Constipation 02/17/2014  . Liver hemangioma   . Chest pain 10/29/2012  . Bacterial conjunctivitis of left eye 09/17/2012  . 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 Lichen planus 46/80/3212  . COLITIS 09/16/2008  . Nonalcoholic steatohepatitis (NASH) 09/16/2008  . SMOKER 04/28/2008  . TINNITUS, LEFT 04/28/2008  . HIP, ARTHRITIS, DEGEN./OSTEO 11/28/2007  . GASTRITIS 02/15/2007  . OSTEOPENIA 02/15/2007  . HYPOTHYROIDISM 01/25/2007  . HYPERLIPIDEMIA 01/25/2007  . MIGRAINE HEADACHE 01/25/2007  . 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  . CATARACTS 01/25/2007  . ALLERGIC RHINITIS 01/25/2007  . GERD (gastroesophageal reflux disease) 01/25/2007  . MALAISE 01/25/2007  . INCONTINENCE 01/25/2007   Teena Irani, PTA/CLT [phone]  Teena Irani 08/14/2019, 4:07 PM  [facility] [address] [place], [place], [address] Phone: [phone]   Fax:  [phone]  Name: Shari Branch MRN: [id number] Date of Birth: 02/22/1946

## 2019-08-19 ENCOUNTER — Other Ambulatory Visit: Payer: Self-pay

## 2019-08-19 ENCOUNTER — Encounter (HOSPITAL_COMMUNITY): Payer: Self-pay | Admitting: Physical Therapy

## 2019-08-19 ENCOUNTER — Ambulatory Visit (HOSPITAL_COMMUNITY): Payer: Medicare Other | Admitting: Physical Therapy

## 2019-08-19 DIAGNOSIS — R2689 Other abnormalities of gait and mobility: Secondary | ICD-10-CM

## 2019-08-19 DIAGNOSIS — R2681 Unsteadiness on feet: Secondary | ICD-10-CM | POA: Diagnosis not present

## 2019-08-19 DIAGNOSIS — M545 Low back pain, unspecified: Secondary | ICD-10-CM

## 2019-08-19 DIAGNOSIS — G8929 Other chronic pain: Secondary | ICD-10-CM

## 2019-08-19 DIAGNOSIS — M6281 Muscle weakness (generalized): Secondary | ICD-10-CM

## 2019-08-19 NOTE — Therapy (Signed)
[place], [place], [address] Phone: [phone]   Fax:  [phone]  Physical Therapy Treatment  Patient Details  Name: Shari Branch MRN: [id number] Date of Birth: Oct 29, 1945 Referring Provider (PT): [name], MD   Encounter Date: 08/19/2019   PT End of Session - 08/19/19 1617    Visit Number 5    Number of Visits 12    Date for PT Re-Evaluation 09/13/19    Authorization Type Primary: Medicare; Secondary: BCBS 57 visit limit PT/OT/ST    Authorization - Visit Number 5    Authorization - Number of Visits 60    Progress Note Due on Visit 10    PT Start Time 1618    PT Stop Time 1658    PT Time Calculation (min) 40 min    Equipment Utilized During Treatment Gait belt    Activity Tolerance Patient tolerated treatment well    Behavior During Therapy WFL for tasks assessed/performed           Past Medical History:  Diagnosis Date  . GERD 01/25/2007   Qualifier: Diagnosis of  By: [place] LPN, Maudie Mercury    . Hepatic steatosis   . Hyperlipidemia   . Liver hemangioma   . Osteopenia    6/17 T -2.1 Left hip  . Thyroid disease   . Trigeminal neuralgia 1997   Trigemninal Tumor- Left, [place]    Past Surgical History:  Procedure Laterality Date  .  trigeminal nerve tumor     numbness on left side of face  . ABDOMINAL HYSTERECTOMY    . BREAST REDUCTION SURGERY    . CATARACT EXTRACTION     bilateral  . CHOLECYSTECTOMY    . COLONOSCOPY  2008   Dr. Oneida Alar: poor prep, inadequate exam  . COLONOSCOPY WITH ESOPHAGOGASTRODUODENOSCOPY (EGD)  2009   Dr. Oneida Alar: good prep, normal TI, normal colon, hemorrhoids, gastritis, nodule in cardia (benign)  . EAR PINNA RECONSTRUCTION W/ RIB GRAFT     7 surgeries starting at age 31  . ESOPHAGOGASTRODUODENOSCOPY  2008   Dr. Oneida Alar: Reflux esophagitis, large hh, gastritis.   Marland Kitchen [name] EXCISION Right 09/19/2013   Procedure: EXCISION [name]  FOOT;  Surgeon: Marcheta Grammes, DPM;  Location: [facility];  Service: Podiatry;  Laterality: Right;  . OSTECTOMY Right 09/19/2013   Procedure: OSTECTOMY;  Surgeon: Marcheta Grammes, DPM;  Location: [facility];  Service: Podiatry;  Laterality: Right;  . RETINAL DETACHMENT SURGERY     bilateral  . TONSILLECTOMY      There were no vitals filed for this visit.   Subjective Assessment - 08/19/19 1620    Subjective States she is still having back pain. States that she has 2/10 pain currently and reports her balance is getting better.    Currently in Pain? Yes    Pain Score 3     Pain Location Back              [facility] PT Assessment - 08/19/19 0001      Assessment   Medical Diagnosis Acute LBP w/o sciatica, Frequent falls    Referring Provider (PT) [name], MD                         [facility] Adult PT Treatment/Exercise - 08/19/19 0001      Knee/Hip Exercises: Standing   Rocker Board Limitations taps lateral - very challenging - 1 UE assist x15 B  Other Standing Knee Exercises semi tandem on line x6, backwards walking x6 15 feet ;  cone taps (3 cones) B 2 finger support on one side 3x5 B ; turning around in circles x3 B     Other Standing Knee Exercises lateral stepping at line x4 laps SBA B, walking with cervical head turns x5 49ft; ; narrow BOS on floor trunk turns 3x5 B --> on foam too challenging                     PT Short Term Goals - 08/07/19 1048      PT SHORT TERM GOAL #1   Title Patient will report understanding and regular compliance with HEP to improve balance and overall functional mobility.    Time 3    Period Weeks    Status On-going    Target Date 08/22/19             PT Long Term Goals - 08/07/19 1048      PT LONG TERM GOAL #1   Title Patient will demonstrate an improvement on the DGI of at least 3 points indicating improved balance and decreased risk of falls.    Time 6    Period Weeks    Status On-going       PT LONG TERM GOAL #2   Title Patient will report an overall improvement in balance of at least 50% for improved confidence performing daily activities.    Time 6    Period Weeks    Status On-going      PT LONG TERM GOAL #3   Title Patient will demonstrate at least 4+/5 MMT strength in all LE muscle groups tested in order to improve functional mobility.    Time 6    Period Weeks    Status On-going                 Plan - 08/19/19 1654    Clinical Impression Statement Session focused on balance today as this is patient's primary complaint. Patient tends to lose balance posterior with all activities. Challenged her with new exercises with mostly dynamic tasks. Could not add rocker board as this was too challenging for patient. Will continue to focus on balance in future sessions.    Personal Factors and Comorbidities Age;Time since onset of injury/illness/exacerbation;Comorbidity 3+    Comorbidities See above    Examination-Activity Limitations Stand;Locomotion Level;Bend    Examination-Participation Restrictions Yard Work;Cleaning;Meal Prep    Stability/Clinical Decision Making Evolving/Moderate complexity    Rehab Potential Fair    PT Frequency 2x / week    PT Duration 6 weeks    PT Treatment/Interventions ADLs/Self Care Home Management;Aquatic Therapy;Cryotherapy;Electrical Stimulation;Moist Heat;DME Instruction;Gait training;Stair training;Functional mobility training;Therapeutic activities;Therapeutic exercise;Balance training;Neuromuscular re-education;Patient/family education;Orthotic Fit/Training;Passive range of motion;Dry needling;Energy conservation;Taping    PT Next Visit Plan Focus on dynamic balance particularly with head turns and full body turn. focus on  balance; update HEP as needed.    PT Home Exercise Plan 08/07/19 sit to stand, tandem stance at sink    Consulted and Agree with Plan of Care Patient           Patient will benefit from skilled therapeutic  intervention in order to improve the following deficits and impairments:  Abnormal gait, Dizziness, Pain, Decreased mobility, Decreased strength, Decreased balance, Difficulty walking  Visit Diagnosis: Unsteadiness on feet  Other abnormalities of gait and mobility  Muscle weakness (generalized)  Chronic bilateral low back pain, unspecified whether sciatica present  Problem List Patient Active Problem List   Diagnosis Date Noted  . Constipation 02/17/2014  . Liver hemangioma   . Chest pain 10/29/2012  . Bacterial conjunctivitis of left eye 09/17/2012  . 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 Lichen planus 38/37/7939  . COLITIS 09/16/2008  . Nonalcoholic steatohepatitis (NASH) 09/16/2008  . SMOKER 04/28/2008  . TINNITUS, LEFT 04/28/2008  . HIP, ARTHRITIS, DEGEN./OSTEO 11/28/2007  . GASTRITIS 02/15/2007  . OSTEOPENIA 02/15/2007  . HYPOTHYROIDISM 01/25/2007  . HYPERLIPIDEMIA 01/25/2007  . MIGRAINE HEADACHE 01/25/2007  . 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  . CATARACTS 01/25/2007  . ALLERGIC RHINITIS 01/25/2007  . GERD (gastroesophageal reflux disease) 01/25/2007  . MALAISE 01/25/2007  . INCONTINENCE 01/25/2007    4:57 PM, 08/19/19 Jerene Pitch, DPT Physical Therapy with [facility]  [phone] office  [facility] [address] [place], [place], [address] Phone: [phone]   Fax:  [phone]  Name: Shari Branch MRN: [id number] Date of Birth: 1945-04-12

## 2019-08-21 ENCOUNTER — Ambulatory Visit (HOSPITAL_COMMUNITY): Payer: Medicare Other | Admitting: Physical Therapy

## 2019-08-21 ENCOUNTER — Other Ambulatory Visit: Payer: Self-pay

## 2019-08-21 ENCOUNTER — Encounter (HOSPITAL_COMMUNITY): Payer: Self-pay | Admitting: Physical Therapy

## 2019-08-21 DIAGNOSIS — M545 Low back pain, unspecified: Secondary | ICD-10-CM

## 2019-08-21 DIAGNOSIS — R2681 Unsteadiness on feet: Secondary | ICD-10-CM

## 2019-08-21 DIAGNOSIS — M6281 Muscle weakness (generalized): Secondary | ICD-10-CM

## 2019-08-21 DIAGNOSIS — R2689 Other abnormalities of gait and mobility: Secondary | ICD-10-CM

## 2019-08-21 DIAGNOSIS — G8929 Other chronic pain: Secondary | ICD-10-CM

## 2019-08-21 NOTE — Therapy (Signed)
[place], [place], [address] Phone: [phone]   Fax:  [phone]  Physical Therapy Treatment  Patient Details  Name: Shari Branch MRN: [id number] Date of Birth: April 14, 1945 Referring Provider (PT): [name], MD   Encounter Date: 08/21/2019   PT End of Session - 08/21/19 1615    Visit Number 6    Number of Visits 12    Date for PT Re-Evaluation 09/13/19    Authorization Type Primary: Medicare; Secondary: BCBS 60 visit limit PT/OT/ST    Authorization - Visit Number 6    Authorization - Number of Visits 60    Progress Note Due on Visit 10    PT Start Time 9935    PT Stop Time 1652    PT Time Calculation (min) 38 min    Equipment Utilized During Treatment Gait belt    Activity Tolerance Patient tolerated treatment well    Behavior During Therapy WFL for tasks assessed/performed           Past Medical History:  Diagnosis Date  . GERD 01/25/2007   Qualifier: Diagnosis of  By: [place] LPN, Maudie Mercury    . Hepatic steatosis   . Hyperlipidemia   . Liver hemangioma   . Osteopenia    6/17 T -2.1 Left hip  . Thyroid disease   . Trigeminal neuralgia 1997   Trigemninal Tumor- Left, [place]    Past Surgical History:  Procedure Laterality Date  .  trigeminal nerve tumor     numbness on left side of face  . ABDOMINAL HYSTERECTOMY    . BREAST REDUCTION SURGERY    . CATARACT EXTRACTION     bilateral  . CHOLECYSTECTOMY    . COLONOSCOPY  2008   Dr. Oneida Alar: poor prep, inadequate exam  . COLONOSCOPY WITH ESOPHAGOGASTRODUODENOSCOPY (EGD)  2009   Dr. Oneida Alar: good prep, normal TI, normal colon, hemorrhoids, gastritis, nodule in cardia (benign)  . EAR PINNA RECONSTRUCTION W/ RIB GRAFT     7 surgeries starting at age 75  . ESOPHAGOGASTRODUODENOSCOPY  2008   Dr. Oneida Alar: Reflux esophagitis, large hh, gastritis.   Marland Kitchen [name] EXCISION Right 09/19/2013   Procedure: EXCISION [name]  FOOT;  Surgeon: Marcheta Grammes, DPM;  Location: [facility];  Service: Podiatry;  Laterality: Right;  . OSTECTOMY Right 09/19/2013   Procedure: OSTECTOMY;  Surgeon: Marcheta Grammes, DPM;  Location: [facility];  Service: Podiatry;  Laterality: Right;  . RETINAL DETACHMENT SURGERY     bilateral  . TONSILLECTOMY      There were no vitals filed for this visit.   Subjective Assessment - 08/21/19 1617    Subjective States she has been doing the side to side walking at home and feels like it is getting better. States she is not in pain and has not had any falls since last session.              [facility] PT Assessment - 08/21/19 0001      Assessment   Medical Diagnosis Acute LBP w/o sciatica, Frequent falls    Referring Provider (PT) [name], MD                         [facility] Adult PT Treatment/Exercise - 08/21/19 0001      Knee/Hip Exercises: Standing   Other Standing Knee Exercises foot taps on 4" step  2x15  B --> then to 12" step 4x5 B; lateral  stepping on balance beam SBA- cues to lean forward towards wall x3 reps B; narrow BOS with cone placement L/R/C on counter --> then added black foam - step by step commands 3x5 B ---> semi tandem on black foam  with head roations 3x5 B     Other Standing Knee Exercises cone tapping - 3 cones with one finger support (did not want to do it without support ) x15 B                     PT Short Term Goals - 08/07/19 1048      PT SHORT TERM GOAL #1   Title Patient will report understanding and regular compliance with HEP to improve balance and overall functional mobility.    Time 3    Period Weeks    Status On-going    Target Date 08/22/19             PT Long Term Goals - 08/07/19 1048      PT LONG TERM GOAL #1   Title Patient will demonstrate an improvement on the DGI of at least 3 points indicating improved balance and decreased risk of falls.    Time 6    Period Weeks    Status On-going      PT  LONG TERM GOAL #2   Title Patient will report an overall improvement in balance of at least 50% for improved confidence performing daily activities.    Time 6    Period Weeks    Status On-going      PT LONG TERM GOAL #3   Title Patient will demonstrate at least 4+/5 MMT strength in all LE muscle groups tested in order to improve functional mobility.    Time 6    Period Weeks    Status On-going                 Plan - 08/21/19 1652    Clinical Impression Statement Patient had difficulties with lateral stepping on balance beam as she tended to just fall backwards instead of taking a step to catch herself. This improved with verbal cues and repetition. Fatigue noted with each exercise so rest break was taken in between reps. On last repetition patient had dizziness and lost balance posteriorly, PT was able to correct loss of balance with gait belt and min/mod assist. Patient most challenged on squishy surface requiring occasional min assist to correct for losses of balance.    Personal Factors and Comorbidities Age;Time since onset of injury/illness/exacerbation;Comorbidity 3+    Comorbidities See above    Examination-Activity Limitations Stand;Locomotion Level;Bend    Examination-Participation Restrictions Yard Work;Cleaning;Meal Prep    Stability/Clinical Decision Making Evolving/Moderate complexity    Rehab Potential Fair    PT Frequency 2x / week    PT Duration 6 weeks    PT Treatment/Interventions ADLs/Self Care Home Management;Aquatic Therapy;Cryotherapy;Electrical Stimulation;Moist Heat;DME Instruction;Gait training;Stair training;Functional mobility training;Therapeutic activities;Therapeutic exercise;Balance training;Neuromuscular re-education;Patient/family education;Orthotic Fit/Training;Passive range of motion;Dry needling;Energy conservation;Taping    PT Next Visit Plan standing on foam with movements most challenging. Focus on dynamic balance particularly with head turns and  full body turn. focus on  balance; update HEP as needed.    PT Home Exercise Plan 08/07/19 sit to stand, tandem stance at sink    Consulted and Agree with Plan of Care Patient           Patient will benefit from skilled therapeutic intervention in order to improve the following deficits  and impairments:  Abnormal gait, Dizziness, Pain, Decreased mobility, Decreased strength, Decreased balance, Difficulty walking  Visit Diagnosis: Unsteadiness on feet  Other abnormalities of gait and mobility  Muscle weakness (generalized)  Chronic bilateral low back pain, unspecified whether sciatica present     Problem List Patient Active Problem List   Diagnosis Date Noted  . Constipation 02/17/2014  . Liver hemangioma   . Chest pain 10/29/2012  . Bacterial conjunctivitis of left eye 09/17/2012  . 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 Lichen planus 59/93/5701  . COLITIS 09/16/2008  . Nonalcoholic steatohepatitis (NASH) 09/16/2008  . SMOKER 04/28/2008  . TINNITUS, LEFT 04/28/2008  . HIP, ARTHRITIS, DEGEN./OSTEO 11/28/2007  . GASTRITIS 02/15/2007  . OSTEOPENIA 02/15/2007  . HYPOTHYROIDISM 01/25/2007  . HYPERLIPIDEMIA 01/25/2007  . MIGRAINE HEADACHE 01/25/2007  . 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  . CATARACTS 01/25/2007  . ALLERGIC RHINITIS 01/25/2007  . GERD (gastroesophageal reflux disease) 01/25/2007  . MALAISE 01/25/2007  . INCONTINENCE 01/25/2007   5:01 PM, 08/21/19 Jerene Pitch, DPT Physical Therapy with [facility]  [phone] office  [place] [address] [place], [place], [address] Phone: [phone]   Fax:  [phone]  Name: Shari Branch MRN: [id number] Date of Birth: [birth year]

## 2019-08-22 ENCOUNTER — Other Ambulatory Visit: Payer: Self-pay | Admitting: Family Medicine

## 2019-08-22 DIAGNOSIS — D429 Neoplasm of uncertain behavior of meninges, unspecified: Secondary | ICD-10-CM

## 2019-08-22 NOTE — Progress Notes (Signed)
Patient called back and was notified of her MRI results and recommendations. Referral for Neurosurgery was placed.

## 2019-08-26 ENCOUNTER — Other Ambulatory Visit: Payer: Self-pay

## 2019-08-26 ENCOUNTER — Encounter (HOSPITAL_COMMUNITY): Payer: Self-pay | Admitting: Physical Therapy

## 2019-08-26 ENCOUNTER — Ambulatory Visit (HOSPITAL_COMMUNITY): Payer: Medicare Other | Admitting: Physical Therapy

## 2019-08-26 DIAGNOSIS — M6281 Muscle weakness (generalized): Secondary | ICD-10-CM

## 2019-08-26 DIAGNOSIS — R2681 Unsteadiness on feet: Secondary | ICD-10-CM

## 2019-08-26 DIAGNOSIS — G8929 Other chronic pain: Secondary | ICD-10-CM

## 2019-08-26 DIAGNOSIS — M545 Low back pain, unspecified: Secondary | ICD-10-CM

## 2019-08-26 DIAGNOSIS — R2689 Other abnormalities of gait and mobility: Secondary | ICD-10-CM

## 2019-08-26 NOTE — Therapy (Signed)
[place], [place], [address] Phone: [phone]   Fax:  [phone]  Physical Therapy Treatment  Patient Details  Name: Shari Branch MRN: [id number] Date of Birth: August 22, 1945 Referring Provider (PT): [name], MD   Encounter Date: 08/26/2019   PT End of Session - 08/26/19 1618    Visit Number 7    Number of Visits 12    Date for PT Re-Evaluation 09/13/19    Authorization Type Primary: Medicare; Secondary: BCBS 60 visit limit PT/OT/ST    Authorization - Visit Number 7    Authorization - Number of Visits 60    Progress Note Due on Visit 10    PT Start Time 1613    PT Stop Time 1651    PT Time Calculation (min) 38 min    Equipment Utilized During Treatment Gait belt    Activity Tolerance Patient tolerated treatment well    Behavior During Therapy WFL for tasks assessed/performed           Past Medical History:  Diagnosis Date  . GERD 01/25/2007   Qualifier: Diagnosis of  By: [place] LPN, Maudie Mercury    . Hepatic steatosis   . Hyperlipidemia   . Liver hemangioma   . Osteopenia    6/17 T -2.1 Left hip  . Thyroid disease   . Trigeminal neuralgia 1997   Trigemninal Tumor- Left, [place]    Past Surgical History:  Procedure Laterality Date  .  trigeminal nerve tumor     numbness on left side of face  . ABDOMINAL HYSTERECTOMY    . BREAST REDUCTION SURGERY    . CATARACT EXTRACTION     bilateral  . CHOLECYSTECTOMY    . COLONOSCOPY  2008   Dr. Oneida Alar: poor prep, inadequate exam  . COLONOSCOPY WITH ESOPHAGOGASTRODUODENOSCOPY (EGD)  2009   Dr. Oneida Alar: good prep, normal TI, normal colon, hemorrhoids, gastritis, nodule in cardia (benign)  . EAR PINNA RECONSTRUCTION W/ RIB GRAFT     7 surgeries starting at age 35  . ESOPHAGOGASTRODUODENOSCOPY  2008   Dr. Oneida Alar: Reflux esophagitis, large hh, gastritis.   Marland Kitchen [name] EXCISION Right 09/19/2013   Procedure: EXCISION [name]  FOOT;  Surgeon: Marcheta Grammes, DPM;  Location: [facility];  Service: Podiatry;  Laterality: Right;  . OSTECTOMY Right 09/19/2013   Procedure: OSTECTOMY;  Surgeon: Marcheta Grammes, DPM;  Location: [facility];  Service: Podiatry;  Laterality: Right;  . RETINAL DETACHMENT SURGERY     bilateral  . TONSILLECTOMY      There were no vitals filed for this visit.   Subjective Assessment - 08/26/19 1617    Subjective MRI results showed a cyst and two brain bleeds and one was over two years old. States she feels a little off balance today. States she doesn't have an apt with her neurosurgeon yet but she is waiting for him to call. States she woke up with the increased balance issues this morning. Reports no pain and that she felt fine after last session.              [facility] PT Assessment - 08/26/19 0001      Assessment   Medical Diagnosis Acute LBP w/o sciatica, Frequent falls    Referring Provider (PT) [name], MD                         [facility] Adult PT Treatment/Exercise - 08/26/19 0001  Knee/Hip Exercises: Standing   Lateral Step Up 4 sets;5 reps;Hand Hold: 0;Step Height: 4";Both    Forward Step Up 4 sets;5 reps;Hand Hold: 0;Step Height: 6";Both               Balance Exercises - 08/26/19 0001      Balance Exercises: Standing   Standing Eyes Opened Narrow base of support (BOS);Foam/compliant surface;5 reps;Head turns   3 sets, blue foam    Tandem Stance Eyes open;Foam/compliant surface;Upper extremity support 1;30 secs;4 reps   2 finger support, bilateral    Other Standing Exercises standing on dyno disc 1 UE support 3x30" - very challenigng.    Other Standing Exercises Comments hurdles 4" height x4 B  forward and lateral  2x4 each                PT Short Term Goals - 08/07/19 1048      PT SHORT TERM GOAL #1   Title Patient will report understanding and regular compliance with HEP to improve balance and overall functional mobility.     Time 3    Period Weeks    Status On-going    Target Date 08/22/19             PT Long Term Goals - 08/07/19 1048      PT LONG TERM GOAL #1   Title Patient will demonstrate an improvement on the DGI of at least 3 points indicating improved balance and decreased risk of falls.    Time 6    Period Weeks    Status On-going      PT LONG TERM GOAL #2   Title Patient will report an overall improvement in balance of at least 50% for improved confidence performing daily activities.    Time 6    Period Weeks    Status On-going      PT LONG TERM GOAL #3   Title Patient will demonstrate at least 4+/5 MMT strength in all LE muscle groups tested in order to improve functional mobility.    Time 6    Period Weeks    Status On-going                 Plan - 08/26/19 1635    Clinical Impression Statement A couple of balance losses noted with forward and lateral step ups but patient able to correct loss of balance with assist of parallel of bars. Increased rest breaks taken between sets secondary to fatigue. Increased difficulties with balance noted on this date. Hurdles were tolerated well but lateral stepping over more challenging then forwards step overs. Will continue to focus on balance as tolerated by patient.    Personal Factors and Comorbidities Age;Time since onset of injury/illness/exacerbation;Comorbidity 3+    Comorbidities See above    Examination-Activity Limitations Stand;Locomotion Level;Bend    Examination-Participation Restrictions Yard Work;Cleaning;Meal Prep    Stability/Clinical Decision Making Evolving/Moderate complexity    Rehab Potential Fair    PT Frequency 2x / week    PT Duration 6 weeks    PT Treatment/Interventions ADLs/Self Care Home Management;Aquatic Therapy;Cryotherapy;Electrical Stimulation;Moist Heat;DME Instruction;Gait training;Stair training;Functional mobility training;Therapeutic activities;Therapeutic exercise;Balance training;Neuromuscular  re-education;Patient/family education;Orthotic Fit/Training;Passive range of motion;Dry needling;Energy conservation;Taping    PT Next Visit Plan standing on foam with movements most challenging. Focus on dynamic balance particularly with head turns and full body turn. focus on  balance; update HEP as needed.    PT Home Exercise Plan 08/07/19 sit to stand, tandem stance at sink; 6/21 narow  BOS with head turns    Consulted and Agree with Plan of Care Patient           Patient will benefit from skilled therapeutic intervention in order to improve the following deficits and impairments:  Abnormal gait, Dizziness, Pain, Decreased mobility, Decreased strength, Decreased balance, Difficulty walking  Visit Diagnosis: Unsteadiness on feet  Other abnormalities of gait and mobility  Muscle weakness (generalized)  Chronic bilateral low back pain, unspecified whether sciatica present     Problem List Patient Active Problem List   Diagnosis Date Noted  . Constipation 02/17/2014  . Liver hemangioma   . Chest pain 10/29/2012  . Bacterial conjunctivitis of left eye 09/17/2012  . 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 Lichen planus 10/62/6948  . COLITIS 09/16/2008  . Nonalcoholic steatohepatitis (NASH) 09/16/2008  . SMOKER 04/28/2008  . TINNITUS, LEFT 04/28/2008  . HIP, ARTHRITIS, DEGEN./OSTEO 11/28/2007  . GASTRITIS 02/15/2007  . OSTEOPENIA 02/15/2007  . HYPOTHYROIDISM 01/25/2007  . HYPERLIPIDEMIA 01/25/2007  . MIGRAINE HEADACHE 01/25/2007  . 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  . CATARACTS 01/25/2007  . ALLERGIC RHINITIS 01/25/2007  . GERD (gastroesophageal reflux disease) 01/25/2007  . MALAISE 01/25/2007  . INCONTINENCE 01/25/2007   4:51 PM, 08/26/19 Jerene Pitch, DPT Physical Therapy with [facility]  [phone] office  [place] [address] [place], [place], [address] Phone: [phone]   Fax:   [phone]  Name: Alante Weimann MRN: [id number] Date of Birth: [birth year]

## 2019-08-28 ENCOUNTER — Telehealth (HOSPITAL_COMMUNITY): Payer: Self-pay

## 2019-08-28 ENCOUNTER — Ambulatory Visit (HOSPITAL_COMMUNITY): Payer: Medicare Other

## 2019-08-28 NOTE — Telephone Encounter (Signed)
pt cancelled appt for today because she is not feeling well 

## 2019-08-29 ENCOUNTER — Other Ambulatory Visit: Payer: Self-pay | Admitting: Family Medicine

## 2019-09-02 ENCOUNTER — Other Ambulatory Visit: Payer: Self-pay

## 2019-09-02 ENCOUNTER — Encounter (HOSPITAL_COMMUNITY): Payer: Self-pay | Admitting: Physical Therapy

## 2019-09-02 ENCOUNTER — Ambulatory Visit (HOSPITAL_COMMUNITY): Payer: Medicare Other | Admitting: Physical Therapy

## 2019-09-02 DIAGNOSIS — R2681 Unsteadiness on feet: Secondary | ICD-10-CM | POA: Diagnosis not present

## 2019-09-02 DIAGNOSIS — M6281 Muscle weakness (generalized): Secondary | ICD-10-CM

## 2019-09-02 DIAGNOSIS — R2689 Other abnormalities of gait and mobility: Secondary | ICD-10-CM

## 2019-09-02 NOTE — Therapy (Signed)
[place], [place], [address] Phone: [phone]   Fax:  [phone]  Physical Therapy Treatment  Patient Details  Name: Shari Branch MRN: [id number] Date of Birth: 08-05-1945 Referring Provider (PT): [name], MD   Encounter Date: 09/02/2019   PT End of Session - 09/02/19 1605    Visit Number 8    Number of Visits 12    Date for PT Re-Evaluation 09/13/19    Authorization Type Primary: Medicare; Secondary: BCBS 60 visit limit PT/OT/ST    Authorization - Visit Number 8    Authorization - Number of Visits 60    Progress Note Due on Visit 10    PT Start Time 1605    PT Stop Time 1645    PT Time Calculation (min) 40 min    Equipment Utilized During Treatment Gait belt    Activity Tolerance Patient tolerated treatment well    Behavior During Therapy WFL for tasks assessed/performed           Past Medical History:  Diagnosis Date  . GERD 01/25/2007   Qualifier: Diagnosis of  By: [place] LPN, Maudie Mercury    . Hepatic steatosis   . Hyperlipidemia   . Liver hemangioma   . Osteopenia    6/17 T -2.1 Left hip  . Thyroid disease   . Trigeminal neuralgia 1997   Trigemninal Tumor- Left, [place]    Past Surgical History:  Procedure Laterality Date  .  trigeminal nerve tumor     numbness on left side of face  . ABDOMINAL HYSTERECTOMY    . BREAST REDUCTION SURGERY    . CATARACT EXTRACTION     bilateral  . CHOLECYSTECTOMY    . COLONOSCOPY  2008   Dr. Oneida Alar: poor prep, inadequate exam  . COLONOSCOPY WITH ESOPHAGOGASTRODUODENOSCOPY (EGD)  2009   Dr. Oneida Alar: good prep, normal TI, normal colon, hemorrhoids, gastritis, nodule in cardia (benign)  . EAR PINNA RECONSTRUCTION W/ RIB GRAFT     7 surgeries starting at age 67  . ESOPHAGOGASTRODUODENOSCOPY  2008   Dr. Oneida Alar: Reflux esophagitis, large hh, gastritis.   Marland Kitchen [name] EXCISION Right 09/19/2013   Procedure: EXCISION [name]  FOOT;  Surgeon: Marcheta Grammes, DPM;  Location: [facility];  Service: Podiatry;  Laterality: Right;  . OSTECTOMY Right 09/19/2013   Procedure: OSTECTOMY;  Surgeon: Marcheta Grammes, DPM;  Location: [facility];  Service: Podiatry;  Laterality: Right;  . RETINAL DETACHMENT SURGERY     bilateral  . TONSILLECTOMY      There were no vitals filed for this visit.   Subjective Assessment - 09/02/19 1608    Subjective States she is working on her balance at home. States it feels about the same. States she has been practicing steps and tandem at home. Reports she had a busy weekend with family. Reports not pain currently.    Currently in Pain? No/denies              [facility] PT Assessment - 09/02/19 0001      Assessment   Medical Diagnosis Acute LBP w/o sciatica, Frequent falls    Referring Provider (PT) [name], MD                         [facility] Adult PT Treatment/Exercise - 09/02/19 0001      Knee/Hip Exercises: Standing   Lateral Step Up 4 sets;5 reps;Step Height: 6";Both  Other Standing Knee Exercises step ups with knee drive, 6" step and 1 UE assist 2x10 B ; lateral stepping on dyno disc up and over - 2 hand support 3x5 B    Other Standing Knee Exercises lateral stepping with RTB pulley x6 B- then backwards and forwards stepping 2x6 ; standing on dyno disc in bars with2 finger support and verbal and tactile cues - 6 minutes total        Knee/Hip Exercises: Seated   Other Seated Knee/Hip Exercises on dyno disc - eyes closed - 30 seconds- eyes open marching- 3x5 B, -- trunk rotations 2x10 B--> exeys closed - LAQs - 3x5 B                     PT Short Term Goals - 08/07/19 1048      PT SHORT TERM GOAL #1   Title Patient will report understanding and regular compliance with HEP to improve balance and overall functional mobility.    Time 3    Period Weeks    Status On-going    Target Date 08/22/19             PT Long Term Goals - 08/07/19  1048      PT LONG TERM GOAL #1   Title Patient will demonstrate an improvement on the DGI of at least 3 points indicating improved balance and decreased risk of falls.    Time 6    Period Weeks    Status On-going      PT LONG TERM GOAL #2   Title Patient will report an overall improvement in balance of at least 50% for improved confidence performing daily activities.    Time 6    Period Weeks    Status On-going      PT LONG TERM GOAL #3   Title Patient will demonstrate at least 4+/5 MMT strength in all LE muscle groups tested in order to improve functional mobility.    Time 6    Period Weeks    Status On-going                 Plan - 09/02/19 1642    Clinical Impression Statement Added seated balance exercise to assess patients trunk awareness. Increased sway noted to the left with all seated exercises. Added walking with theraband pulling laterally and then forward, backwards and forwards walking with band most challenging with 2 loss of balances but patient was able to independently correct. Resumed standing on dyno disc and this was still very challenging for patient. Patient has tendency to lose balance posteriorly and needed step by step instructions to attempt to shift weight forwards to keep from falling backwards. Fatigue noted end of session.    Personal Factors and Comorbidities Age;Time since onset of injury/illness/exacerbation;Comorbidity 3+    Comorbidities See above    Examination-Activity Limitations Stand;Locomotion Level;Bend    Examination-Participation Restrictions Yard Work;Cleaning;Meal Prep    Stability/Clinical Decision Making Evolving/Moderate complexity    Rehab Potential Fair    PT Frequency 2x / week    PT Duration 6 weeks    PT Treatment/Interventions ADLs/Self Care Home Management;Aquatic Therapy;Cryotherapy;Electrical Stimulation;Moist Heat;DME Instruction;Gait training;Stair training;Functional mobility training;Therapeutic activities;Therapeutic  exercise;Balance training;Neuromuscular re-education;Patient/family education;Orthotic Fit/Training;Passive range of motion;Dry needling;Energy conservation;Taping    PT Next Visit Plan standing on foam with movements most challenging. Focus on dynamic balance particularly with head turns and full body turn. focus on  balance; update HEP as needed.    PT Home  Exercise Plan 08/07/19 sit to stand, tandem stance at sink; 6/21 narow BOS with head turns    Consulted and Agree with Plan of Care Patient           Patient will benefit from skilled therapeutic intervention in order to improve the following deficits and impairments:  Abnormal gait, Dizziness, Pain, Decreased mobility, Decreased strength, Decreased balance, Difficulty walking  Visit Diagnosis: Unsteadiness on feet  Other abnormalities of gait and mobility  Muscle weakness (generalized)     Problem List Patient Active Problem List   Diagnosis Date Noted  . Constipation 02/17/2014  . Liver hemangioma   . Chest pain 10/29/2012  . Bacterial conjunctivitis of left eye 09/17/2012  . 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 Lichen planus 76/15/1834  . COLITIS 09/16/2008  . Nonalcoholic steatohepatitis (NASH) 09/16/2008  . SMOKER 04/28/2008  . TINNITUS, LEFT 04/28/2008  . HIP, ARTHRITIS, DEGEN./OSTEO 11/28/2007  . GASTRITIS 02/15/2007  . OSTEOPENIA 02/15/2007  . HYPOTHYROIDISM 01/25/2007  . HYPERLIPIDEMIA 01/25/2007  . MIGRAINE HEADACHE 01/25/2007  . 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  . CATARACTS 01/25/2007  . ALLERGIC RHINITIS 01/25/2007  . GERD (gastroesophageal reflux disease) 01/25/2007  . MALAISE 01/25/2007  . INCONTINENCE 01/25/2007   4:45 PM, 09/02/19 Jerene Pitch, DPT Physical Therapy with [facility]  [phone] office  [place] [address] [place], [place], [address] Phone: [phone]   Fax:  [phone]  Name: Shari Branch MRN:  [id number] Date of Birth: 1945-05-17

## 2019-09-04 ENCOUNTER — Other Ambulatory Visit: Payer: Self-pay

## 2019-09-04 ENCOUNTER — Ambulatory Visit (HOSPITAL_COMMUNITY): Payer: Medicare Other | Admitting: Physical Therapy

## 2019-09-04 ENCOUNTER — Encounter (HOSPITAL_COMMUNITY): Payer: Self-pay | Admitting: Physical Therapy

## 2019-09-04 DIAGNOSIS — R2689 Other abnormalities of gait and mobility: Secondary | ICD-10-CM

## 2019-09-04 DIAGNOSIS — R2681 Unsteadiness on feet: Secondary | ICD-10-CM | POA: Diagnosis not present

## 2019-09-04 DIAGNOSIS — M545 Low back pain, unspecified: Secondary | ICD-10-CM

## 2019-09-04 DIAGNOSIS — M6281 Muscle weakness (generalized): Secondary | ICD-10-CM

## 2019-09-04 NOTE — Therapy (Signed)
[place], [place], [address] Phone: [phone]   Fax:  [phone]  Physical Therapy Treatment  Patient Details  Name: Shari Branch MRN: [id number] Date of Birth: 1945/08/20 Referring Provider (PT): [name], MD   Encounter Date: 09/04/2019   PT End of Session - 09/04/19 1554    Visit Number 9    Number of Visits 12    Date for PT Re-Evaluation 09/13/19    Authorization Type Primary: Medicare; Secondary: [employer] 60 visit limit PT/OT/ST    Authorization - Visit Number 9    Authorization - Number of Visits 60    Progress Note Due on Visit 10    PT Start Time 1556    PT Stop Time 1636    PT Time Calculation (min) 40 min    Equipment Utilized During Treatment Gait belt    Activity Tolerance Patient tolerated treatment well    Behavior During Therapy WFL for tasks assessed/performed           Past Medical History:  Diagnosis Date  . GERD 01/25/2007   Qualifier: Diagnosis of  By: [place] LPN, Maudie Mercury    . Hepatic steatosis   . Hyperlipidemia   . Liver hemangioma   . Osteopenia    6/17 T -2.1 Left hip  . Thyroid disease   . Trigeminal neuralgia 1997   Trigemninal Tumor- Left, [place]    Past Surgical History:  Procedure Laterality Date  .  trigeminal nerve tumor     numbness on left side of face  . ABDOMINAL HYSTERECTOMY    . BREAST REDUCTION SURGERY    . CATARACT EXTRACTION     bilateral  . CHOLECYSTECTOMY    . COLONOSCOPY  2008   Dr. Oneida Alar: poor prep, inadequate exam  . COLONOSCOPY WITH ESOPHAGOGASTRODUODENOSCOPY (EGD)  2009   Dr. Oneida Alar: good prep, normal TI, normal colon, hemorrhoids, gastritis, nodule in cardia (benign)  . EAR PINNA RECONSTRUCTION W/ RIB GRAFT     7 surgeries starting at age 60  . ESOPHAGOGASTRODUODENOSCOPY  2008   Dr. Oneida Alar: Reflux esophagitis, large hh, gastritis.   Marland Kitchen [name] EXCISION Right 09/19/2013   Procedure: EXCISION [name]  FOOT;  Surgeon: Marcheta Grammes, DPM;  Location: [facility];  Service: Podiatry;  Laterality: Right;  . OSTECTOMY Right 09/19/2013   Procedure: OSTECTOMY;  Surgeon: Marcheta Grammes, DPM;  Location: [facility];  Service: Podiatry;  Laterality: Right;  . RETINAL DETACHMENT SURGERY     bilateral  . TONSILLECTOMY      There were no vitals filed for this visit.   Subjective Assessment - 09/04/19 1557    Subjective States she has been doing her exercises at home, feels her balance is about the same.    Currently in Pain? No/denies              [facility] PT Assessment - 09/04/19 0001      Assessment   Medical Diagnosis Acute LBP w/o sciatica, Frequent falls    Referring Provider (PT) [name], MD                         [facility] PT Treatment/Exercise - 09/04/19 0001      Knee/Hip Exercises: Standing   Lateral Step Up Both;3 sets;10 reps;Hand Hold: 0   blue foam   Forward Step Up 3 sets;10 reps;Hand Hold: 0;Both   blue foam pad  Balance Exercises - 09/04/19 0001      Balance Exercises: Standing   Tandem Gait Forward;4 reps   15 feet   Retro Gait 4 reps   2 sets   Other Standing Exercises weaving in and out of cones x6     Other Standing Exercises Comments taping cones with foot - no UE support - 3 cones 3x5 B on each cone L/C/R                PT Short Term Goals - 08/07/19 1048      PT SHORT TERM GOAL #1   Title Patient will report understanding and regular compliance with HEP to improve balance and overall functional mobility.    Time 3    Period Weeks    Status On-going    Target Date 08/22/19             PT Long Term Goals - 08/07/19 1048      PT LONG TERM GOAL #1   Title Patient will demonstrate an improvement on the DGI of at least 3 points indicating improved balance and decreased risk of falls.    Time 6    Period Weeks    Status On-going      PT LONG TERM GOAL #2   Title Patient will report an overall  improvement in balance of at least 50% for improved confidence performing daily activities.    Time 6    Period Weeks    Status On-going      PT LONG TERM GOAL #3   Title Patient will demonstrate at least 4+/5 MMT strength in all LE muscle groups tested in order to improve functional mobility.    Time 6    Period Weeks    Status On-going                 Plan - 09/04/19 1636    Clinical Impression Statement Fatigue noted in legs during today's session. Added step ups on foam which were very challenging. No loss of balance noted during today's session. Patient's balance continues to improve. Will reassess next session to determine patient's progress.    Personal Factors and Comorbidities Age;Time since onset of injury/illness/exacerbation;Comorbidity 3+    Comorbidities See above    Examination-Activity Limitations Stand;Locomotion Level;Bend    Examination-Participation Restrictions Yard Work;Cleaning;Meal Prep    Stability/Clinical Decision Making Evolving/Moderate complexity    Rehab Potential Fair    PT Frequency 2x / week    PT Duration 6 weeks    PT Treatment/Interventions ADLs/Self Care Home Management;Aquatic Therapy;Cryotherapy;Electrical Stimulation;Moist Heat;DME Instruction;Gait training;Stair training;Functional mobility training;Therapeutic activities;Therapeutic exercise;Balance training;Neuromuscular re-education;Patient/family education;Orthotic Fit/Training;Passive range of motion;Dry needling;Energy conservation;Taping    PT Next Visit Plan reassessment next session; standing on foam with movements most challenging. Focus on dynamic balance particularly with head turns and full body turn. focus on  balance; update HEP as needed.    PT Home Exercise Plan 08/07/19 sit to stand, tandem stance at sink; 6/21 narow BOS with head turns    Consulted and Agree with Plan of Care Patient           Patient will benefit from skilled therapeutic intervention in order to  improve the following deficits and impairments:  Abnormal gait, Dizziness, Pain, Decreased mobility, Decreased strength, Decreased balance, Difficulty walking  Visit Diagnosis: Unsteadiness on feet  Other abnormalities of gait and mobility  Muscle weakness (generalized)  Chronic bilateral low back pain, unspecified whether sciatica present     Problem  List Patient Active Problem List   Diagnosis Date Noted  . Constipation 02/17/2014  . Liver hemangioma   . Chest pain 10/29/2012  . Bacterial conjunctivitis of left eye 09/17/2012  . 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 Lichen planus 17/91/5056  . COLITIS 09/16/2008  . Nonalcoholic steatohepatitis (NASH) 09/16/2008  . SMOKER 04/28/2008  . TINNITUS, LEFT 04/28/2008  . HIP, ARTHRITIS, DEGEN./OSTEO 11/28/2007  . GASTRITIS 02/15/2007  . OSTEOPENIA 02/15/2007  . HYPOTHYROIDISM 01/25/2007  . HYPERLIPIDEMIA 01/25/2007  . MIGRAINE HEADACHE 01/25/2007  . 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  . CATARACTS 01/25/2007  . ALLERGIC RHINITIS 01/25/2007  . GERD (gastroesophageal reflux disease) 01/25/2007  . MALAISE 01/25/2007  . INCONTINENCE 01/25/2007   4:41 PM, 09/04/19 Jerene Pitch, DPT Physical Therapy with [facility]  [phone] office  [place] [address] [place], [place], [address] Phone: [phone]   Fax:  [phone]  Name: Shari Branch MRN: [id number] Date of Birth: Jan 17, 1946

## 2019-09-10 ENCOUNTER — Ambulatory Visit (HOSPITAL_COMMUNITY): Payer: Medicare Other | Admitting: Physical Therapy

## 2019-09-12 ENCOUNTER — Ambulatory Visit (HOSPITAL_COMMUNITY): Payer: Medicare Other | Attending: Family Medicine | Admitting: Physical Therapy

## 2019-09-12 ENCOUNTER — Encounter (HOSPITAL_COMMUNITY): Payer: Self-pay | Admitting: Physical Therapy

## 2019-09-12 ENCOUNTER — Telehealth (HOSPITAL_COMMUNITY): Payer: Self-pay | Admitting: Physical Therapy

## 2019-09-12 NOTE — Telephone Encounter (Signed)
Called regarding patient not showing for appointment this date. Patient stated she was having some hip pain today and that is why she did not come to therapy. She stated that she would like to be discharged at this time and that she is doing a lot better.   Clarene Critchley PT, DPT 4:26 PM, 09/12/19 [phone]

## 2019-09-12 NOTE — Therapy (Signed)
[place], [place], [address] Phone: [phone]   Fax:  [phone]  Patient Details  Name: Chimere Klingensmith MRN: [id number] Date of Birth: 30-Jul-1945 Referring Provider:  No ref. provider found  Encounter Date: 09/12/2019  PHYSICAL THERAPY DISCHARGE SUMMARY  Visits from Start of Care: 9  Current functional level related to goals / functional outcomes: Unknown as patient did not return for re-assessment visit.    Remaining deficits: Unknown as patient did not return for re-assessment visit.    Education / Equipment: HEP Plan: Patient agrees to discharge.  Patient goals were not met. Patient is being discharged due to the patient's request.  ?????      Clarene Critchley PT, DPT 4:31 PM, 09/12/19 [place], [place], [address] Phone: [phone]   Fax:  [phone]

## 2019-09-16 ENCOUNTER — Other Ambulatory Visit: Payer: Self-pay | Admitting: Family Medicine

## 2019-10-24 ENCOUNTER — Other Ambulatory Visit: Payer: Self-pay | Admitting: Family Medicine

## 2019-12-03 ENCOUNTER — Other Ambulatory Visit: Payer: Self-pay | Admitting: Family Medicine

## 2020-01-16 ENCOUNTER — Ambulatory Visit (INDEPENDENT_AMBULATORY_CARE_PROVIDER_SITE_OTHER): Payer: Medicare Other | Admitting: Family Medicine

## 2020-01-16 ENCOUNTER — Other Ambulatory Visit: Payer: Self-pay

## 2020-01-16 VITALS — BP 110/70 | HR 101 | Temp 97.9°F | Ht 68.0 in | Wt 172.0 lb

## 2020-01-16 DIAGNOSIS — I872 Venous insufficiency (chronic) (peripheral): Secondary | ICD-10-CM

## 2020-01-16 DIAGNOSIS — Z23 Encounter for immunization: Secondary | ICD-10-CM

## 2020-01-16 MED ORDER — FUROSEMIDE 20 MG PO TABS: 20.0000 mg | ORAL_TABLET | Freq: Every day | ORAL | 3 refills | Status: DC | PRN

## 2020-01-16 NOTE — Progress Notes (Signed)
Subjective:    Patient ID: Shari Branch, female    DOB: 08/21/1945, 74 y.o.   MRN: [id number]  Patient is concerned that Shari Branch may have cellulitis in her right leg.  Shari Branch states that for the last month, there has been a slight reddish tinge to her skin.  The leg will swell every day.  The swelling comes and goes.  Both legs swell however the right leg is slightly worse than the left leg.  There is no warmth to the skin.  There is no pain upon palpation.  See the photograph below    Past Medical History:  Diagnosis Date  . GERD 01/25/2007   Qualifier: Diagnosis of  By: [place] LPN, Maudie Mercury    . Hepatic steatosis   . Hyperlipidemia   . Liver hemangioma   . Osteopenia    6/17 T -2.1 Left hip  . Thyroid disease   . Trigeminal neuralgia 1997   Trigemninal Tumor- Left, [place]   Past Surgical History:  Procedure Laterality Date  .  trigeminal nerve tumor     numbness on left side of face  . ABDOMINAL HYSTERECTOMY    . BREAST REDUCTION SURGERY    . CATARACT EXTRACTION     bilateral  . CHOLECYSTECTOMY    . COLONOSCOPY  2008   Dr. Oneida Alar: poor prep, inadequate exam  . COLONOSCOPY WITH ESOPHAGOGASTRODUODENOSCOPY (EGD)  2009   Dr. Oneida Alar: good prep, normal TI, normal colon, hemorrhoids, gastritis, nodule in cardia (benign)  . EAR PINNA RECONSTRUCTION W/ RIB GRAFT     7 surgeries starting at age 62  . ESOPHAGOGASTRODUODENOSCOPY  2008   Dr. Oneida Alar: Reflux esophagitis, large hh, gastritis.   Marland Kitchen [name] EXCISION Right 09/19/2013   Procedure: EXCISION [name] FOOT;  Surgeon: Marcheta Grammes, DPM;  Location: [facility];  Service: Podiatry;  Laterality: Right;  . OSTECTOMY Right 09/19/2013   Procedure: OSTECTOMY;  Surgeon: Marcheta Grammes, DPM;  Location: [facility];  Service: Podiatry;  Laterality: Right;  . RETINAL DETACHMENT SURGERY     bilateral  . TONSILLECTOMY     Current Outpatient Medications on File Prior to Visit  Medication Sig Dispense Refill    . alendronate (FOSAMAX) 70 MG tablet Take 1 tablet (70 mg total) by mouth every 7 (seven) days. Take with a full glass of water on an empty stomach. (Patient not taking: Reported on 05/14/2019) 4 tablet 11  . amitriptyline (ELAVIL) 50 MG tablet TAKE 3 TABLETS BY MOUTH EVERY DAY AT BEDTIME 90 tablet 3  . atorvastatin (LIPITOR) 20 MG tablet Take 1 tablet (20 mg total) by mouth daily. 90 tablet 3  . celecoxib (CELEBREX) 200 MG capsule Take 1 capsule (200 mg total) by mouth 2 (two) times daily. 60 capsule 2  . escitalopram (LEXAPRO) 20 MG tablet Take 1 tablet (20 mg total) by mouth daily. 90 tablet 2  . EUTHYROX 112 MCG tablet Take 1 tablet by mouth once daily 90 tablet 3  . HYDROcodone-acetaminophen (NORCO) 5-325 MG tablet Take 1 tablet by mouth every 6 (six) hours as needed for moderate pain. 60 tablet 0  . naproxen (NAPROSYN) 500 MG tablet TAKE ONE TABLET BY MOUTH TWICE DAILY WITH MEALS (Patient not taking: Reported on 07/18/2019) 60 tablet 2  . pantoprazole (PROTONIX) 40 MG tablet Take 1 tablet by mouth twice daily 180 tablet 3   No current facility-administered medications on file prior to visit.   Allergies  Allergen Reactions  . Sulfonamide Derivatives  REACTION: Joint Pain and locking - walks like a 74 year old   Social History   Socioeconomic History  . Marital status: Married    Spouse name: Not on file  . Number of children: 3  . Years of education: Not on file  . Highest education level: Not on file  Occupational History  . Occupation: PT - [employer]  Tobacco Use  . Smoking status: Current Every Day Smoker    Packs/day: 1.50  . Smokeless tobacco: Never Used  Substance and Sexual Activity  . Alcohol use: No  . Drug use: No  . Sexual activity: Not on file  Other Topics Concern  . Not on file  Social History Narrative  . Not on file   Social Determinants of Health   Financial Resource Strain:   . Difficulty of Paying Living Expenses: Not on file  Food Insecurity:   .  Worried About Charity fundraiser in the Last Year: Not on file  . Ran Out of Food in the Last Year: Not on file  Transportation Needs:   . Lack of Transportation (Medical): Not on file  . Lack of Transportation (Non-Medical): Not on file  Physical Activity:   . Days of Exercise per Week: Not on file  . Minutes of Exercise per Session: Not on file  Stress:   . Feeling of Stress : Not on file  Social Connections:   . Frequency of Communication with Friends and Family: Not on file  . Frequency of Social Gatherings with Friends and Family: Not on file  . Attends Religious Services: Not on file  . Active Member of Clubs or Organizations: Not on file  . Attends Archivist Meetings: Not on file  . Marital Status: Not on file  Intimate Partner Violence:   . Fear of Current or Ex-Partner: Not on file  . Emotionally Abused: Not on file  . Physically Abused: Not on file  . Sexually Abused: Not on file     Past Medical History:  Diagnosis Date  . GERD 01/25/2007   Qualifier: Diagnosis of  By: [place] LPN, Maudie Mercury    . Hepatic steatosis   . Hyperlipidemia   . Liver hemangioma   . Osteopenia    6/17 T -2.1 Left hip  . Thyroid disease   . Trigeminal neuralgia 1997   Trigemninal Tumor- Left, [place]   Past Surgical History:  Procedure Laterality Date  .  trigeminal nerve tumor     numbness on left side of face  . ABDOMINAL HYSTERECTOMY    . BREAST REDUCTION SURGERY    . CATARACT EXTRACTION     bilateral  . CHOLECYSTECTOMY    . COLONOSCOPY  2008   Dr. Oneida Alar: poor prep, inadequate exam  . COLONOSCOPY WITH ESOPHAGOGASTRODUODENOSCOPY (EGD)  2009   Dr. Oneida Alar: good prep, normal TI, normal colon, hemorrhoids, gastritis, nodule in cardia (benign)  . EAR PINNA RECONSTRUCTION W/ RIB GRAFT     7 surgeries starting at age 69  . ESOPHAGOGASTRODUODENOSCOPY  2008   Dr. Oneida Alar: Reflux esophagitis, large hh, gastritis.   Marland Kitchen [name] EXCISION Right 09/19/2013    Procedure: EXCISION [name] FOOT;  Surgeon: Marcheta Grammes, DPM;  Location: [facility];  Service: Podiatry;  Laterality: Right;  . OSTECTOMY Right 09/19/2013   Procedure: OSTECTOMY;  Surgeon: Marcheta Grammes, DPM;  Location: [facility];  Service: Podiatry;  Laterality: Right;  . RETINAL DETACHMENT SURGERY     bilateral  . TONSILLECTOMY  Current Outpatient Medications on File Prior to Visit  Medication Sig Dispense Refill  . alendronate (FOSAMAX) 70 MG tablet Take 1 tablet (70 mg total) by mouth every 7 (seven) days. Take with a full glass of water on an empty stomach. (Patient not taking: Reported on 05/14/2019) 4 tablet 11  . amitriptyline (ELAVIL) 50 MG tablet TAKE 3 TABLETS BY MOUTH EVERY DAY AT BEDTIME 90 tablet 3  . atorvastatin (LIPITOR) 20 MG tablet Take 1 tablet (20 mg total) by mouth daily. 90 tablet 3  . celecoxib (CELEBREX) 200 MG capsule Take 1 capsule (200 mg total) by mouth 2 (two) times daily. 60 capsule 2  . escitalopram (LEXAPRO) 20 MG tablet Take 1 tablet (20 mg total) by mouth daily. 90 tablet 2  . EUTHYROX 112 MCG tablet Take 1 tablet by mouth once daily 90 tablet 3  . HYDROcodone-acetaminophen (NORCO) 5-325 MG tablet Take 1 tablet by mouth every 6 (six) hours as needed for moderate pain. 60 tablet 0  . naproxen (NAPROSYN) 500 MG tablet TAKE ONE TABLET BY MOUTH TWICE DAILY WITH MEALS (Patient not taking: Reported on 07/18/2019) 60 tablet 2  . pantoprazole (PROTONIX) 40 MG tablet Take 1 tablet by mouth twice daily 180 tablet 3   No current facility-administered medications on file prior to visit.   Allergies  Allergen Reactions  . Sulfonamide Derivatives     REACTION: Joint Pain and locking - walks like a 74 year old   Social History   Socioeconomic History  . Marital status: Married    Spouse name: Not on file  . Number of children: 3  . Years of education: Not on file  . Highest education level: Not on file  Occupational History  . Occupation: PT -  [employer]  Tobacco Use  . Smoking status: Current Every Day Smoker    Packs/day: 1.50  . Smokeless tobacco: Never Used  Substance and Sexual Activity  . Alcohol use: No  . Drug use: No  . Sexual activity: Not on file  Other Topics Concern  . Not on file  Social History Narrative  . Not on file   Social Determinants of Health   Financial Resource Strain:   . Difficulty of Paying Living Expenses: Not on file  Food Insecurity:   . Worried About Charity fundraiser in the Last Year: Not on file  . Ran Out of Food in the Last Year: Not on file  Transportation Needs:   . Lack of Transportation (Medical): Not on file  . Lack of Transportation (Non-Medical): Not on file  Physical Activity:   . Days of Exercise per Week: Not on file  . Minutes of Exercise per Session: Not on file  Stress:   . Feeling of Stress : Not on file  Social Connections:   . Frequency of Communication with Friends and Family: Not on file  . Frequency of Social Gatherings with Friends and Family: Not on file  . Attends Religious Services: Not on file  . Active Member of Clubs or Organizations: Not on file  . Attends Archivist Meetings: Not on file  . Marital Status: Not on file  Intimate Partner Violence:   . Fear of Current or Ex-Partner: Not on file  . Emotionally Abused: Not on file  . Physically Abused: Not on file  . Sexually Abused: Not on file      Review of Systems  All other systems reviewed and are negative.      Objective:  Physical Exam Vitals reviewed.  Constitutional:      General: Shari Branch is not in acute distress.    Appearance: Shari Branch is well-developed. Shari Branch is not diaphoretic.  Cardiovascular:     Rate and Rhythm: Normal rate and regular rhythm.     Heart sounds: Normal heart sounds. No murmur heard.  No gallop.   Pulmonary:     Effort: Pulmonary effort is normal. No respiratory distress.     Breath sounds: No wheezing, rhonchi or rales.  Musculoskeletal:     Right lower  leg: Edema present.     Left lower leg: Edema present.  Skin:    Findings: Rash present. No bruising, erythema or lesion.  Neurological:     Mental Status: Shari Branch is alert and oriented to person, place, and time.     Cranial Nerves: No cranial nerve deficit.     Sensory: No sensory deficit.     Motor: No atrophy or abnormal muscle tone.     Deep Tendon Reflexes: Reflexes are normal and symmetric.           Assessment & Plan:  Venous stasis dermatitis of both lower extremities  I do not believe that the patient has cellulitis.  Instead I believe that the skin changes are more apt to be due to venous stasis dermatitis.  We discussed options including monitoring, compression hose, using a diuretic.  Shari Branch prefers simply to monitor it at the present time.  I did give her Lasix 20 mg to take as needed on days when the swelling is bad and recommended that Shari Branch keep her legs elevated is much as possible.  Also recommended compression hose over-the-counter to help counter the swelling.

## 2020-01-29 ENCOUNTER — Other Ambulatory Visit: Payer: Self-pay | Admitting: Family Medicine

## 2020-02-25 ENCOUNTER — Other Ambulatory Visit: Payer: Self-pay

## 2020-02-25 ENCOUNTER — Ambulatory Visit (INDEPENDENT_AMBULATORY_CARE_PROVIDER_SITE_OTHER): Payer: Medicare Other | Admitting: Family Medicine

## 2020-02-25 VITALS — BP 130/80 | HR 93 | Temp 97.8°F | Ht 68.0 in | Wt 175.0 lb

## 2020-02-25 DIAGNOSIS — M545 Low back pain, unspecified: Secondary | ICD-10-CM | POA: Diagnosis not present

## 2020-02-25 DIAGNOSIS — R296 Repeated falls: Secondary | ICD-10-CM | POA: Diagnosis not present

## 2020-02-25 DIAGNOSIS — G3281 Cerebellar ataxia in diseases classified elsewhere: Secondary | ICD-10-CM

## 2020-02-25 DIAGNOSIS — M5412 Radiculopathy, cervical region: Secondary | ICD-10-CM

## 2020-02-25 DIAGNOSIS — G8929 Other chronic pain: Secondary | ICD-10-CM | POA: Diagnosis not present

## 2020-02-25 MED ORDER — NAPROXEN 500 MG PO TABS: 500.0000 mg | ORAL_TABLET | Freq: Two times a day (BID) | ORAL | 2 refills | Status: DC

## 2020-02-25 NOTE — Progress Notes (Signed)
Subjective:    Patient ID: Shari Branch, female    DOB: [birth year], 74 y.o.   MRN: [id number]  12/25/18 About 10 or 11 days ago, the patient slipped while she was standing in the bathroom on some water.  She fell directly on her tailbone from a standing height.  The floor is hard.  She instantly felt pain radiate up from her tailbone all the way to her neck.  She now has constant bandlike pain in her lower back roughly at the level of L4 and L5 that wraps around both iliac crest to the front.  The pain is constant.  Is made worse by movement and by prolonged standing.  She denies any bowel or bladder incontinence.  She denies any saddle anesthesia.  She denies any lumbar radiculopathy or sciatica.  She denies any leg weakness or numbness.  She has normal reflexes checked the patella and Achilles.  She has normal muscle strength in both legs.  At that time, my plan was: I am concerned about a possible vertebral fracture.  I recommended an x-ray of the lumbar spine to evaluate further.  Meanwhile treat the pain with hydrocodone/acetaminophen 5/325 1 p.o. every 6 hours as needed pain.  The patient is already tried naproxen with no relief.  01/22/19 X-ray does show a compression fracture at T12.  However the patient's pain seems lower than that.  She is also complaining of pain roughly around the level of L4 in a bandlike pattern.  The pain is intense.  She is having to take hydrocodone every 6 hours.  1 pill every 6 hours does not alleviate the pain.  She denies any symptoms of cauda equina syndrome.  She denies any bowel or bladder incontinence.  She denies any leg weakness.  She denies any leg numbness.  She denies any radicular symptoms.  However the pain is intense and is a stabbing pain every time she stands up.  It occurred suddenly approximately 6 weeks ago when the patient slipped as stated above.  Therefore this would be consistent with an acute injury which would go along with compression fracture  seen at T12.  Patient is interested in possible kyphoplasty.  At that time, my plan was: Patient's pain is lower than the location of the compression fracture.  Therefore I recommended an MRI of the lumbar spine.  Of asked if they can include T12 on the images as well.  This would give [place] a look at T12 as well as the lower spine where she reports the pain.  If the MRI confirms an acute vertebral fracture at T12 and there is no significant arthritis seen lower in the spine that would explain the patient's pain, I would refer the patient for possible kyphoplasty to help alleviate her pain as she is requiring an extensive use of opiates.  However if the compression fracture appears old or if there is another significant finding lower in the MRI at the level of L4-L5, the patient may benefit from seeing neurosurgery to discuss cortisone injections as the compression fracture at T12 may be a coincidental finding.  Hydrocodone was refilled and 60 tablets were given until we can obtain information on the MRI.  07/18/19  MRI- T12-L1: No significant spinal stenosis. Inferior endplate fracture 624THL with mild bony retropulsion into the canal  L1-2: Negative  L2-3: Mild disc bulging and mild facet degeneration. Negative for stenosis  L3-4: Mild disc degeneration. Small right paracentral disc protrusion at mild facet degeneration. No significant  spinal stenosis  L4-5: Disc degeneration with disc bulging and endplate spurring. Bilateral facet degeneration causing mild spinal stenosis and mild subarticular stenosis bilaterally.  L5-S1: Mild disc bulging. Mild facet degeneration. Mild subarticular stenosis bilaterally.  Patient is here today again reporting low back pain.  She is having to take the pain medication 1-2 times a day.  However the pain is located far lower than the location of the compression fracture at T12 and L1.  It is located in the center of her back roughly around the level of L5.   MRI of the lumbar spine in December revealed no significant abnormalities in this area other than some mild degenerative disc disease.  Patient has tenderness to palpation in the paraspinal muscles just above the coccyx.  She reports pain and stiffness upon standing.  She reports stiffness with walking and constant pain with walking.  She denies any sciatica or radiculopathy.  I do not believe this pain is secondary to the vertebral fracture.  Patient has not been taking Naprosyn due to the fact that the combination of the Naprosyn and the pain medication upsets her stomach.  She has not tried Celebrex.  She is also falling more frequently.  She recently rolled out of bed and fell and hit the floor striking her upper back on the bed.  She does not believe that the amitriptyline is contributing to her falls.  Instead she reports ataxia on a daily basis.  Even today as she stands up she staggers and has to hold onto the chair in the desk.  She staggers into the door frame as she walks.  I tried to perform Romberg testing however the patient could not put her feet together and stand erect without wavering in the air and almost losing her balance.  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  She denies any recent head trauma.  At that time, my plan was: I do not believe the pain in her lower back is due to the compression fracture.  Instead I believe is most likely muscular coupled with some mild arthritic changes.  Therefore rather than just continue to use pain medication I recommended a referral to physical therapy.  I would also start Celebrex 200 mg twice a day as an anti-inflammatory to see if we can better manage her pain rather than simply masking it with hydrocodone twice a day.  I am concerned about her ataxia and more frequent falls.  I have recommended an MRI of the brain to reassess the mass that she is known to  have in the left lateral ventricle and also to rule out CVA.  Patient is comfortable with this plan.  02/25/20 MRI of the brain was obtained after her last visit:  IMPRESSION: 1. No acute finding.  No specific explanation for ataxia. 2. 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 There has been intracystic hemorrhage since a 2018 comparison with mass contraction. No associated hydrocephalus or brain edema. 3. 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 She has followed up with her neurosurgeon who states that no further work-up is necessary for this.  Unfortunately she continues to fall.  She states that her balance is poor.  She states that she is fallen numerous times over the last few months.  There is no particular time of day.  She denies any vertigo.  She denies any headaches.  She does have some pain in her neck particular on the right side.  The pain radiates from the right side of her neck into her right shoulder and up into her right occiput.  She describes it as a burning tingling pain similar to the way your arm feels after you hit your funny bone or when it falls asleep.  Therefore it sounds like some type of nerve impingement on the right side of the cervical spine.  Other than this, she denies any other neurologic deficits.  She is still taking amitriptyline 150 mg daily for her trigeminal neuralgia.  She also wants to stop her Celebrex.  She states that this is not helping with her low back pain or her arthritic pain or her neck pain.  She was doing better on Naprosyn.  We switched due to GI upset.  She is willing to accept the risk of GI upset and resume the Naprosyn as this managed her pain better.  She is already on a proton pump inhibitor so she feels that the wrist to her stomach will be minimal. Past Medical History:  Diagnosis Date  . GERD 01/25/2007   Qualifier: Diagnosis of  By: [place] LPN, Maudie Mercury    . Hepatic steatosis   . Hyperlipidemia   .  Liver hemangioma   . Osteopenia    6/17 T -2.1 Left hip  . Thyroid disease   . Trigeminal neuralgia 1997   Trigemninal Tumor- Left, [place]   Past Surgical History:  Procedure Laterality Date  .  trigeminal nerve tumor     numbness on left side of face  . ABDOMINAL HYSTERECTOMY    . BREAST REDUCTION SURGERY    . CATARACT EXTRACTION     bilateral  . CHOLECYSTECTOMY    . COLONOSCOPY  2008   Dr. Oneida Alar: poor prep, inadequate exam  . COLONOSCOPY WITH ESOPHAGOGASTRODUODENOSCOPY (EGD)  2009   Dr. Oneida Alar: good prep, normal TI, normal colon, hemorrhoids, gastritis, nodule in cardia (benign)  . EAR PINNA RECONSTRUCTION W/ RIB GRAFT     7 surgeries starting at age 66  . ESOPHAGOGASTRODUODENOSCOPY  2008   Dr. Oneida Alar: Reflux esophagitis, large hh, gastritis.   Marland Kitchen [name] EXCISION Right 09/19/2013   Procedure: EXCISION [name] FOOT;  Surgeon: Marcheta Grammes, DPM;  Location: [facility];  Service: Podiatry;  Laterality: Right;  . OSTECTOMY Right 09/19/2013   Procedure: OSTECTOMY;  Surgeon: Marcheta Grammes, DPM;  Location: [facility];  Service: Podiatry;  Laterality: Right;  . RETINAL DETACHMENT SURGERY     bilateral  . TONSILLECTOMY     Current Outpatient Medications on File Prior to Visit  Medication Sig Dispense Refill  . amitriptyline (ELAVIL) 50 MG tablet TAKE 3 TABLETS BY MOUTH EVERY DAY AT BEDTIME 90 tablet 0  . atorvastatin (LIPITOR) 20 MG tablet Take 1 tablet (20 mg total) by mouth daily. 90 tablet 3  . escitalopram (LEXAPRO) 20 MG tablet Take 1 tablet (20 mg total) by mouth daily. 90 tablet 2  . EUTHYROX 112 MCG tablet Take 1 tablet by mouth once daily 90 tablet 3  . furosemide (LASIX) 20 MG tablet Take 1 tablet (20 mg total) by mouth daily as needed (leg swelling). 30 tablet 3  . HYDROcodone-acetaminophen (NORCO) 5-325 MG tablet Take 1 tablet by mouth every 6 (six) hours as needed for moderate pain. 60 tablet 0  . pantoprazole (PROTONIX) 40 MG  tablet Take 1 tablet by mouth twice daily 180 tablet 3  .  alendronate (FOSAMAX) 70 MG tablet Take 1 tablet (70 mg total) by mouth every 7 (seven) days. Take with a full glass of water on an empty stomach. (Patient not taking: No sig reported) 4 tablet 11   No current facility-administered medications on file prior to visit.   Allergies  Allergen Reactions  . Sulfonamide Derivatives     REACTION: Joint Pain and locking - walks like a 74 year old   Social History   Socioeconomic History  . Marital status: Married    Spouse name: Not on file  . Number of children: 3  . Years of education: Not on file  . Highest education level: Not on file  Occupational History  . Occupation: PT - [employer]  Tobacco Use  . Smoking status: Current Every Day Smoker    Packs/day: 1.50  . Smokeless tobacco: Never Used  Substance and Sexual Activity  . Alcohol use: No  . Drug use: No  . Sexual activity: Not on file  Other Topics Concern  . Not on file  Social History Narrative  . Not on file   Social Determinants of Health   Financial Resource Strain: Not on file  Food Insecurity: Not on file  Transportation Needs: Not on file  Physical Activity: Not on file  Stress: Not on file  Social Connections: Not on file  Intimate Partner Violence: Not on file     Past Medical History:  Diagnosis Date  . GERD 01/25/2007   Qualifier: Diagnosis of  By: [place] LPN, Maudie Mercury    . Hepatic steatosis   . Hyperlipidemia   . Liver hemangioma   . Osteopenia    6/17 T -2.1 Left hip  . Thyroid disease   . Trigeminal neuralgia 1997   Trigemninal Tumor- Left, [place]   Past Surgical History:  Procedure Laterality Date  .  trigeminal nerve tumor     numbness on left side of face  . ABDOMINAL HYSTERECTOMY    . BREAST REDUCTION SURGERY    . CATARACT EXTRACTION     bilateral  . CHOLECYSTECTOMY    . COLONOSCOPY  2008   Dr. Oneida Alar: poor prep, inadequate exam  . COLONOSCOPY WITH  ESOPHAGOGASTRODUODENOSCOPY (EGD)  2009   Dr. Oneida Alar: good prep, normal TI, normal colon, hemorrhoids, gastritis, nodule in cardia (benign)  . EAR PINNA RECONSTRUCTION W/ RIB GRAFT     7 surgeries starting at age 13  . ESOPHAGOGASTRODUODENOSCOPY  2008   Dr. Oneida Alar: Reflux esophagitis, large hh, gastritis.   Marland Kitchen [name] EXCISION Right 09/19/2013   Procedure: EXCISION [name] FOOT;  Surgeon: Marcheta Grammes, DPM;  Location: [facility];  Service: Podiatry;  Laterality: Right;  . OSTECTOMY Right 09/19/2013   Procedure: OSTECTOMY;  Surgeon: Marcheta Grammes, DPM;  Location: [facility];  Service: Podiatry;  Laterality: Right;  . RETINAL DETACHMENT SURGERY     bilateral  . TONSILLECTOMY     Current Outpatient Medications on File Prior to Visit  Medication Sig Dispense Refill  . amitriptyline (ELAVIL) 50 MG tablet TAKE 3 TABLETS BY MOUTH EVERY DAY AT BEDTIME 90 tablet 0  . atorvastatin (LIPITOR) 20 MG tablet Take 1 tablet (20 mg total) by mouth daily. 90 tablet 3  . escitalopram (LEXAPRO) 20 MG tablet Take 1 tablet (20 mg total) by mouth daily. 90 tablet 2  . EUTHYROX 112 MCG tablet Take 1 tablet by mouth once daily 90 tablet 3  . furosemide (LASIX) 20 MG tablet Take 1 tablet (20 mg  total) by mouth daily as needed (leg swelling). 30 tablet 3  . HYDROcodone-acetaminophen (NORCO) 5-325 MG tablet Take 1 tablet by mouth every 6 (six) hours as needed for moderate pain. 60 tablet 0  . pantoprazole (PROTONIX) 40 MG tablet Take 1 tablet by mouth twice daily 180 tablet 3  . alendronate (FOSAMAX) 70 MG tablet Take 1 tablet (70 mg total) by mouth every 7 (seven) days. Take with a full glass of water on an empty stomach. (Patient not taking: No sig reported) 4 tablet 11   No current facility-administered medications on file prior to visit.   Allergies  Allergen Reactions  . Sulfonamide Derivatives     REACTION: Joint Pain and locking - walks like a 74 year old   Social History    Socioeconomic History  . Marital status: Married    Spouse name: Not on file  . Number of children: 3  . Years of education: Not on file  . Highest education level: Not on file  Occupational History  . Occupation: PT - [employer]  Tobacco Use  . Smoking status: Current Every Day Smoker    Packs/day: 1.50  . Smokeless tobacco: Never Used  Substance and Sexual Activity  . Alcohol use: No  . Drug use: No  . Sexual activity: Not on file  Other Topics Concern  . Not on file  Social History Narrative  . Not on file   Social Determinants of Health   Financial Resource Strain: Not on file  Food Insecurity: Not on file  Transportation Needs: Not on file  Physical Activity: Not on file  Stress: Not on file  Social Connections: Not on file  Intimate Partner Violence: Not on file      Review of Systems  All other systems reviewed and are negative.      Objective:   Physical Exam Vitals reviewed.  Constitutional:      General: She is not in acute distress.    Appearance: She is well-developed. She is not diaphoretic.  Cardiovascular:     Rate and Rhythm: Normal rate and regular rhythm.     Heart sounds: Normal heart sounds. No murmur heard. No gallop.   Pulmonary:     Effort: Pulmonary effort is normal. No respiratory distress.     Breath sounds: No wheezing, rhonchi or rales.  Musculoskeletal:     Lumbar back: Tenderness present. No spasms or bony tenderness. Decreased range of motion.       Back:  Neurological:     Mental Status: She is alert and oriented to person, place, and time.     Cranial Nerves: No cranial nerve deficit.     Sensory: No sensory deficit.     Motor: No atrophy or abnormal muscle tone.     Coordination: Coordination abnormal.     Gait: Gait abnormal.     Deep Tendon Reflexes: Reflexes are normal and symmetric.           Assessment & Plan:  Cervical radiculopathy - Plan: DG Cervical Spine Complete  Frequent falls  Cerebellar ataxia  in diseases classified elsewhere ([place])  Chronic midline low back pain without sciatica  Patient has 4 issues.  First she is having frequent falls and balance issues.  Second she is having neck pain radiating up and down the right side of her neck located around the level of C3-C4.  Third she is having chronic low back pain.  Fourth is her chronic arthritic pain in her back and in  her knees.  My first concern about her frequent falls and balance issues could be polypharmacy.  She is on a high dose of amitriptyline which can certainly affect balance.  Therefore I recommended that we wean off of this over the next week.  Then I want to monitor for the next 2 weeks to see if her balance improves.  If not, I would recommend neurology consultation to determine if she may have some type of cerebellar ataxia however I suspect that the amitriptyline is playing a large role in her balance issues.  Second I believe that the pain in her neck is cervical radiculopathy.  Begin by obtaining an x-ray of the neck to evaluate further.  Start the Naprosyn and discontinue Celebrex.  If the x-ray is normal and the pain persist I would recommend an MRI of the cervical spine as she may benefit from epidural steroid injections.  Third, the patient's chronic low back pain is likely due to degenerative disc disease coupled with her T12 compression fracture.  This will managed with naproxen.  The MRI obtained last year showed no significant abnormalities other than mild degenerative disc disease and a T12 compression fracture.  Therefore I think we should focus on symptomatic control.  Fourth, the patient will stop Celebrex and resume naproxen but she will continue the proton pump inhibitor for GI protection.  Patient will call me in 3 weeks with an update.  If the balance is not better I will consult neurology

## 2020-02-28 ENCOUNTER — Other Ambulatory Visit: Payer: Self-pay | Admitting: Family Medicine

## 2020-03-03 ENCOUNTER — Ambulatory Visit (HOSPITAL_COMMUNITY)
Admission: RE | Admit: 2020-03-03 | Discharge: 2020-03-03 | Disposition: A | Discharge status: Home / Self Care | Payer: Medicare Other | Source: Ambulatory Visit | Attending: Family Medicine | Admitting: Family Medicine

## 2020-03-03 ENCOUNTER — Other Ambulatory Visit: Payer: Self-pay

## 2020-03-03 DIAGNOSIS — M542 Cervicalgia: Secondary | ICD-10-CM | POA: Diagnosis not present

## 2020-03-03 DIAGNOSIS — M5412 Radiculopathy, cervical region: Secondary | ICD-10-CM | POA: Diagnosis not present

## 2020-03-05 ENCOUNTER — Other Ambulatory Visit: Payer: Self-pay | Admitting: *Deleted

## 2020-03-05 DIAGNOSIS — M5412 Radiculopathy, cervical region: Secondary | ICD-10-CM

## 2020-03-07 DIAGNOSIS — F418 Other specified anxiety disorders: Secondary | ICD-10-CM | POA: Insufficient documentation

## 2020-03-07 DIAGNOSIS — I10 Essential (primary) hypertension: Secondary | ICD-10-CM | POA: Insufficient documentation

## 2020-03-30 DIAGNOSIS — D432 Neoplasm of uncertain behavior of brain, unspecified: Secondary | ICD-10-CM | POA: Diagnosis not present

## 2020-03-30 DIAGNOSIS — D329 Benign neoplasm of meninges, unspecified: Secondary | ICD-10-CM | POA: Diagnosis not present

## 2020-03-30 DIAGNOSIS — R030 Elevated blood-pressure reading, without diagnosis of hypertension: Secondary | ICD-10-CM | POA: Diagnosis not present

## 2020-03-30 DIAGNOSIS — Z6826 Body mass index (BMI) 26.0-26.9, adult: Secondary | ICD-10-CM | POA: Diagnosis not present

## 2020-06-08 ENCOUNTER — Other Ambulatory Visit: Payer: Self-pay | Admitting: Family Medicine

## 2020-07-02 ENCOUNTER — Other Ambulatory Visit: Payer: Self-pay | Admitting: Family Medicine

## 2020-08-19 ENCOUNTER — Other Ambulatory Visit (HOSPITAL_COMMUNITY): Payer: Self-pay | Admitting: Neurosurgery

## 2020-08-19 ENCOUNTER — Other Ambulatory Visit: Payer: Self-pay | Admitting: Neurosurgery

## 2020-08-19 DIAGNOSIS — D329 Benign neoplasm of meninges, unspecified: Secondary | ICD-10-CM

## 2020-08-24 ENCOUNTER — Encounter: Payer: Self-pay | Admitting: Family Medicine

## 2020-08-24 ENCOUNTER — Other Ambulatory Visit: Payer: Self-pay

## 2020-08-24 ENCOUNTER — Ambulatory Visit (INDEPENDENT_AMBULATORY_CARE_PROVIDER_SITE_OTHER): Payer: Medicare Other | Admitting: Family Medicine

## 2020-08-24 VITALS — BP 120/68 | HR 80 | Temp 98.4°F | Resp 16 | Ht 68.0 in | Wt 169.0 lb

## 2020-08-24 DIAGNOSIS — M5412 Radiculopathy, cervical region: Secondary | ICD-10-CM

## 2020-08-24 NOTE — Progress Notes (Signed)
Subjective:    Patient ID: Shari Branch, female    DOB: August 19, 1945, 75 y.o.   MRN: [id number]  I saw the patient originally in December for what I thought was cervical radiculopathy.  She again presents today complaining of pain in her left shoulder.  However she has no pain with abduction greater than 90 degrees.  She has full and painless range of motion in her left shoulder.  She has a negative empty can sign.  She has a negative Hawking sign.  She has no pain with resisted abduction of the shoulder.  She has no crepitus in the shoulder with passive range of motion.  She does complain of pain in her neck.  She states that the pain radiates from her neck into her shoulder and she also gets some numbness and tingling in her left hand.  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 Oneida Alar: poor prep, inadequate exam   COLONOSCOPY WITH ESOPHAGOGASTRODUODENOSCOPY (EGD)  2009   Dr. Oneida Alar: good prep, normal TI, normal colon, hemorrhoids, gastritis, nodule in cardia (benign)   EAR PINNA RECONSTRUCTION W/ RIB GRAFT     7 surgeries starting at age 58   ESOPHAGOGASTRODUODENOSCOPY  2008   Dr. Oneida Alar: Reflux esophagitis, large hh, gastritis.    [name] EXCISION Right 09/19/2013   Procedure: EXCISION [name] FOOT;  Surgeon: Marcheta Grammes, DPM;  Location: [facility];  Service: Podiatry;  Laterality: Right;   OSTECTOMY Right 09/19/2013   Procedure: OSTECTOMY;  Surgeon:  Marcheta Grammes, DPM;  Location: [facility];  Service: Podiatry;  Laterality: Right;   RETINAL DETACHMENT SURGERY     bilateral   TONSILLECTOMY     Current Outpatient Medications on File Prior to Visit  Medication Sig Dispense Refill   alendronate (FOSAMAX) 70 MG tablet Take 1 tablet (70 mg total) by mouth every 7 (seven) days. Take with a full glass of water on an empty stomach. 4 tablet 11   amitriptyline (ELAVIL) 50 MG tablet TAKE 3 TABLETS BY MOUTH EVERY DAY AT BEDTIME 90 tablet 3   atorvastatin (LIPITOR) 20 MG tablet Take 1 tablet (20 mg total) by mouth daily. 90 tablet 3   escitalopram (LEXAPRO) 20 MG tablet Take 1 tablet by mouth once daily 90 tablet 0   EUTHYROX 112 MCG tablet Take 1 tablet by mouth once daily 90 tablet 3   furosemide (LASIX) 20 MG tablet Take 1 tablet (20 mg total) by mouth daily as needed (leg swelling). 30 tablet 3   naproxen (NAPROSYN) 500 MG tablet Take 1 tablet (500 mg total) by mouth 2 (two) times daily with a meal. 60 tablet 2   pantoprazole (PROTONIX) 40 MG tablet Take 1 tablet by mouth twice daily 180 tablet 0   No current facility-administered medications on file prior to visit.  Allergies  Allergen Reactions   Sulfonamide Derivatives     REACTION: Joint Pain and locking - walks like a 75 year old   Social History   Socioeconomic History   Marital status: Married    Spouse name: Not on file   Number of children: 3   Years of education: Not on file   Highest education level: Not on file  Occupational History   Occupation: PT - [employer]  Tobacco Use   Smoking status: Every Day    Packs/day: 1.50    Pack years: 0.00    Types: Cigarettes   Smokeless tobacco: Never  Substance and Sexual Activity   Alcohol use: No   Drug use: No   Sexual activity: Not on file  Other Topics Concern   Not on file  Social History Narrative   Not on file   Social Determinants of Health   Financial Resource Strain: Not on file  Food Insecurity: Not on file   Transportation Needs: Not on file  Physical Activity: Not on file  Stress: Not on file  Social Connections: Not on file  Intimate Partner Violence: Not on file     Past Medical History:  Diagnosis Date   GERD 01/25/2007   Qualifier: Diagnosis of  By: [place] LPN, Kim     Hepatic steatosis    Hyperlipidemia    Liver hemangioma    Osteopenia    6/17 T -2.1 Left hip   Thyroid disease    Trigeminal neuralgia 1997   Trigemninal Tumor- Left, [place]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 Oneida Alar: poor prep, inadequate exam   COLONOSCOPY WITH ESOPHAGOGASTRODUODENOSCOPY (EGD)  2009   Dr. Oneida Alar: good prep, normal TI, normal colon, hemorrhoids, gastritis, nodule in cardia (benign)   EAR PINNA RECONSTRUCTION W/ RIB GRAFT     7 surgeries starting at age 36   ESOPHAGOGASTRODUODENOSCOPY  2008   Dr. Oneida Alar: Reflux esophagitis, large hh, gastritis.    [name] EXCISION Right 09/19/2013   Procedure: EXCISION [name] FOOT;  Surgeon: Marcheta Grammes, DPM;  Location: [facility];  Service: Podiatry;  Laterality: Right;   OSTECTOMY Right 09/19/2013   Procedure: OSTECTOMY;  Surgeon: Marcheta Grammes, DPM;  Location: [facility];  Service: Podiatry;  Laterality: Right;   RETINAL DETACHMENT SURGERY     bilateral   TONSILLECTOMY     Current Outpatient Medications on File Prior to Visit  Medication Sig Dispense Refill   alendronate (FOSAMAX) 70 MG tablet Take 1 tablet (70 mg total) by mouth every 7 (seven) days. Take with a full glass of water on an empty stomach. 4 tablet 11   amitriptyline (ELAVIL) 50 MG tablet TAKE 3 TABLETS BY MOUTH EVERY DAY AT BEDTIME 90 tablet 3   atorvastatin (LIPITOR) 20 MG tablet Take 1 tablet (20 mg total) by mouth daily.  90 tablet 3   escitalopram (LEXAPRO) 20 MG tablet Take 1 tablet by mouth once daily 90 tablet 0   EUTHYROX 112 MCG tablet Take 1 tablet by mouth once daily 90 tablet 3   furosemide (LASIX) 20 MG tablet Take 1 tablet (20 mg total) by mouth daily as needed (leg swelling). 30 tablet  3   naproxen (NAPROSYN) 500 MG tablet Take 1 tablet (500 mg total) by mouth 2 (two) times daily with a meal. 60 tablet 2   pantoprazole (PROTONIX) 40 MG tablet Take 1 tablet by mouth twice daily 180 tablet 0   No current facility-administered medications on file prior to visit.   Allergies  Allergen Reactions   Sulfonamide Derivatives     REACTION: Joint Pain and locking - walks like a 75 year old   Social History   Socioeconomic History   Marital status: Married    Spouse name: Not on file   Number of children: 3   Years of education: Not on file   Highest education level: Not on file  Occupational History   Occupation: PT - [employer]  Tobacco Use   Smoking status: Every Day    Packs/day: 1.50    Pack years: 0.00    Types: Cigarettes   Smokeless tobacco: Never  Substance and Sexual Activity   Alcohol use: No   Drug use: No   Sexual activity: Not on file  Other Topics Concern   Not on file  Social History Narrative   Not on file   Social Determinants of Health   Financial Resource Strain: Not on file  Food Insecurity: Not on file  Transportation Needs: Not on file  Physical Activity: Not on file  Stress: Not on file  Social Connections: Not on file  Intimate Partner Violence: Not on file      Review of Systems  All other systems reviewed and are negative.     Objective:   Physical Exam Vitals reviewed.  Constitutional:      General: She is not in acute distress.    Appearance: She is well-developed. She is not diaphoretic.  Cardiovascular:     Rate and Rhythm: Normal rate and regular rhythm.     Heart sounds: Normal heart sounds. No murmur heard.   No gallop.  Pulmonary:      Effort: Pulmonary effort is normal. No respiratory distress.     Breath sounds: No wheezing, rhonchi or rales.  Musculoskeletal:     Left shoulder: No tenderness, bony tenderness or crepitus. Normal range of motion. Normal strength.     Cervical back: Spasms and tenderness present. No crepitus. Pain with movement present. Decreased range of motion.     Lumbar back: Tenderness present. No spasms or bony tenderness. Decreased range of motion.       Back:  Neurological:     Motor: No atrophy or abnormal muscle tone.          Assessment & Plan:  Cervical radiculopathy I believe the pain the patient is experiencing in her left shoulder is actually referred pain from her neck.  We discussed options today including an MRI of the cervical spine.  Patient does not want any cortisone injections or surgeries.  We reviewed the x-ray of her cervical spine I obtained in December which did show degenerative disc disease but no acute injury.  Patient states that she can live with the pain.  I offered her a cortisone injection in the shoulder to make sure that this is not subacromial bursitis however both I and the patient feel this is most likely coming from her neck.  Also offer the patient a prednisone taper pack but she declined

## 2020-09-02 ENCOUNTER — Other Ambulatory Visit: Payer: Self-pay

## 2020-09-02 ENCOUNTER — Ambulatory Visit (HOSPITAL_COMMUNITY)
Admission: RE | Admit: 2020-09-02 | Discharge: 2020-09-02 | Disposition: A | Discharge status: Home / Self Care | Payer: Medicare Other | Source: Ambulatory Visit | Attending: Neurosurgery | Admitting: Neurosurgery

## 2020-09-02 DIAGNOSIS — D329 Benign neoplasm of meninges, unspecified: Secondary | ICD-10-CM | POA: Diagnosis not present

## 2020-09-02 DIAGNOSIS — G319 Degenerative disease of nervous system, unspecified: Secondary | ICD-10-CM | POA: Diagnosis not present

## 2020-09-02 MED ORDER — GADOBUTROL 1 MMOL/ML IV SOLN
7.5000 mL | Freq: Once | INTRAVENOUS | Status: AC | PRN
  Administered 2020-09-02: 7.5 mL via INTRAVENOUS

## 2020-09-08 ENCOUNTER — Other Ambulatory Visit: Payer: Self-pay | Admitting: Family Medicine

## 2020-09-28 DIAGNOSIS — Z6826 Body mass index (BMI) 26.0-26.9, adult: Secondary | ICD-10-CM | POA: Diagnosis not present

## 2020-09-28 DIAGNOSIS — D432 Neoplasm of uncertain behavior of brain, unspecified: Secondary | ICD-10-CM | POA: Diagnosis not present

## 2020-09-28 DIAGNOSIS — D329 Benign neoplasm of meninges, unspecified: Secondary | ICD-10-CM | POA: Diagnosis not present

## 2020-10-06 ENCOUNTER — Emergency Department (HOSPITAL_COMMUNITY): Payer: Medicare Other

## 2020-10-06 ENCOUNTER — Other Ambulatory Visit: Payer: Self-pay

## 2020-10-06 ENCOUNTER — Encounter (HOSPITAL_COMMUNITY): Payer: Self-pay | Admitting: Emergency Medicine

## 2020-10-06 ENCOUNTER — Inpatient Hospital Stay (HOSPITAL_COMMUNITY)
Admission: EM | Admit: 2020-10-06 | Discharge: 2020-10-13 | DRG: 481 | Disposition: A | Discharge status: Skilled Nursing Facility | Payer: Medicare Other | Attending: Internal Medicine | Admitting: Internal Medicine

## 2020-10-06 DIAGNOSIS — G500 Trigeminal neuralgia: Secondary | ICD-10-CM | POA: Diagnosis present

## 2020-10-06 DIAGNOSIS — Z800 Family history of malignant neoplasm of digestive organs: Secondary | ICD-10-CM | POA: Diagnosis not present

## 2020-10-06 DIAGNOSIS — R0902 Hypoxemia: Secondary | ICD-10-CM | POA: Diagnosis present

## 2020-10-06 DIAGNOSIS — Y92009 Unspecified place in unspecified non-institutional (private) residence as the place of occurrence of the external cause: Secondary | ICD-10-CM

## 2020-10-06 DIAGNOSIS — M8580 Other specified disorders of bone density and structure, unspecified site: Secondary | ICD-10-CM | POA: Diagnosis present

## 2020-10-06 DIAGNOSIS — Z720 Tobacco use: Secondary | ICD-10-CM

## 2020-10-06 DIAGNOSIS — R030 Elevated blood-pressure reading, without diagnosis of hypertension: Secondary | ICD-10-CM | POA: Diagnosis present

## 2020-10-06 DIAGNOSIS — D329 Benign neoplasm of meninges, unspecified: Secondary | ICD-10-CM

## 2020-10-06 DIAGNOSIS — M6281 Muscle weakness (generalized): Secondary | ICD-10-CM | POA: Diagnosis present

## 2020-10-06 DIAGNOSIS — R102 Pelvic and perineal pain: Secondary | ICD-10-CM | POA: Diagnosis not present

## 2020-10-06 DIAGNOSIS — R41841 Cognitive communication deficit: Secondary | ICD-10-CM | POA: Diagnosis present

## 2020-10-06 DIAGNOSIS — Z79899 Other long term (current) drug therapy: Secondary | ICD-10-CM | POA: Diagnosis not present

## 2020-10-06 DIAGNOSIS — E871 Hypo-osmolality and hyponatremia: Secondary | ICD-10-CM | POA: Diagnosis present

## 2020-10-06 DIAGNOSIS — E782 Mixed hyperlipidemia: Secondary | ICD-10-CM | POA: Diagnosis not present

## 2020-10-06 DIAGNOSIS — D62 Acute posthemorrhagic anemia: Secondary | ICD-10-CM | POA: Diagnosis not present

## 2020-10-06 DIAGNOSIS — R000 Tachycardia, unspecified: Secondary | ICD-10-CM | POA: Diagnosis not present

## 2020-10-06 DIAGNOSIS — D61818 Other pancytopenia: Secondary | ICD-10-CM

## 2020-10-06 DIAGNOSIS — M79604 Pain in right leg: Secondary | ICD-10-CM | POA: Diagnosis not present

## 2020-10-06 DIAGNOSIS — D518 Other vitamin B12 deficiency anemias: Secondary | ICD-10-CM | POA: Diagnosis present

## 2020-10-06 DIAGNOSIS — R2689 Other abnormalities of gait and mobility: Secondary | ICD-10-CM | POA: Diagnosis present

## 2020-10-06 DIAGNOSIS — S72091D Other fracture of head and neck of right femur, subsequent encounter for closed fracture with routine healing: Secondary | ICD-10-CM | POA: Diagnosis not present

## 2020-10-06 DIAGNOSIS — E538 Deficiency of other specified B group vitamins: Secondary | ICD-10-CM | POA: Diagnosis present

## 2020-10-06 DIAGNOSIS — M25461 Effusion, right knee: Secondary | ICD-10-CM | POA: Diagnosis not present

## 2020-10-06 DIAGNOSIS — Z86011 Personal history of benign neoplasm of the brain: Secondary | ICD-10-CM | POA: Diagnosis not present

## 2020-10-06 DIAGNOSIS — D529 Folate deficiency anemia, unspecified: Secondary | ICD-10-CM | POA: Diagnosis present

## 2020-10-06 DIAGNOSIS — R279 Unspecified lack of coordination: Secondary | ICD-10-CM | POA: Diagnosis present

## 2020-10-06 DIAGNOSIS — K219 Gastro-esophageal reflux disease without esophagitis: Secondary | ICD-10-CM | POA: Diagnosis present

## 2020-10-06 DIAGNOSIS — J9811 Atelectasis: Secondary | ICD-10-CM | POA: Diagnosis present

## 2020-10-06 DIAGNOSIS — M25551 Pain in right hip: Secondary | ICD-10-CM | POA: Diagnosis not present

## 2020-10-06 DIAGNOSIS — Z20822 Contact with and (suspected) exposure to covid-19: Secondary | ICD-10-CM | POA: Diagnosis present

## 2020-10-06 DIAGNOSIS — T501X5A Adverse effect of loop [high-ceiling] diuretics, initial encounter: Secondary | ICD-10-CM | POA: Diagnosis present

## 2020-10-06 DIAGNOSIS — Z7983 Long term (current) use of bisphosphonates: Secondary | ICD-10-CM | POA: Diagnosis not present

## 2020-10-06 DIAGNOSIS — E039 Hypothyroidism, unspecified: Secondary | ICD-10-CM | POA: Diagnosis present

## 2020-10-06 DIAGNOSIS — Z743 Need for continuous supervision: Secondary | ICD-10-CM | POA: Diagnosis not present

## 2020-10-06 DIAGNOSIS — W07XXXA Fall from chair, initial encounter: Secondary | ICD-10-CM | POA: Diagnosis present

## 2020-10-06 DIAGNOSIS — S72009A Fracture of unspecified part of neck of unspecified femur, initial encounter for closed fracture: Secondary | ICD-10-CM

## 2020-10-06 DIAGNOSIS — K5900 Constipation, unspecified: Secondary | ICD-10-CM | POA: Diagnosis present

## 2020-10-06 DIAGNOSIS — W19XXXA Unspecified fall, initial encounter: Secondary | ICD-10-CM | POA: Diagnosis not present

## 2020-10-06 DIAGNOSIS — R739 Hyperglycemia, unspecified: Secondary | ICD-10-CM

## 2020-10-06 DIAGNOSIS — D320 Benign neoplasm of cerebral meninges: Secondary | ICD-10-CM | POA: Diagnosis present

## 2020-10-06 DIAGNOSIS — S72001A Fracture of unspecified part of neck of right femur, initial encounter for closed fracture: Secondary | ICD-10-CM

## 2020-10-06 DIAGNOSIS — J449 Chronic obstructive pulmonary disease, unspecified: Secondary | ICD-10-CM | POA: Diagnosis present

## 2020-10-06 DIAGNOSIS — R52 Pain, unspecified: Secondary | ICD-10-CM | POA: Diagnosis not present

## 2020-10-06 DIAGNOSIS — R531 Weakness: Secondary | ICD-10-CM | POA: Diagnosis not present

## 2020-10-06 DIAGNOSIS — S72011A Unspecified intracapsular fracture of right femur, initial encounter for closed fracture: Principal | ICD-10-CM | POA: Diagnosis present

## 2020-10-06 DIAGNOSIS — Z01818 Encounter for other preprocedural examination: Secondary | ICD-10-CM | POA: Diagnosis not present

## 2020-10-06 DIAGNOSIS — F339 Major depressive disorder, recurrent, unspecified: Secondary | ICD-10-CM | POA: Diagnosis present

## 2020-10-06 DIAGNOSIS — R0602 Shortness of breath: Secondary | ICD-10-CM | POA: Diagnosis not present

## 2020-10-06 DIAGNOSIS — F17210 Nicotine dependence, cigarettes, uncomplicated: Secondary | ICD-10-CM | POA: Diagnosis present

## 2020-10-06 DIAGNOSIS — D649 Anemia, unspecified: Secondary | ICD-10-CM | POA: Diagnosis not present

## 2020-10-06 DIAGNOSIS — Z882 Allergy status to sulfonamides status: Secondary | ICD-10-CM

## 2020-10-06 DIAGNOSIS — S72011D Unspecified intracapsular fracture of right femur, subsequent encounter for closed fracture with routine healing: Secondary | ICD-10-CM | POA: Diagnosis not present

## 2020-10-06 DIAGNOSIS — E785 Hyperlipidemia, unspecified: Secondary | ICD-10-CM | POA: Diagnosis present

## 2020-10-06 LAB — CBC WITH DIFFERENTIAL/PLATELET
Abs Immature Granulocytes: 0.01 10*3/uL (ref 0.00–0.07)
Basophils Absolute: 0 10*3/uL (ref 0.0–0.1)
Basophils Relative: 0 %
Eosinophils Absolute: 0 10*3/uL (ref 0.0–0.5)
Eosinophils Relative: 1 %
HCT: 33.6 % — ABNORMAL LOW (ref 36.0–46.0)
Hemoglobin: 11.1 g/dL — ABNORMAL LOW (ref 12.0–15.0)
Immature Granulocytes: 0 %
Lymphocytes Relative: 8 %
Lymphs Abs: 0.3 10*3/uL — ABNORMAL LOW (ref 0.7–4.0)
MCH: 31.6 pg (ref 26.0–34.0)
MCHC: 33 g/dL (ref 30.0–36.0)
MCV: 95.7 fL (ref 80.0–100.0)
Monocytes Absolute: 0.2 10*3/uL (ref 0.1–1.0)
Monocytes Relative: 4 %
Neutro Abs: 3.4 10*3/uL (ref 1.7–7.7)
Neutrophils Relative %: 87 %
Platelets: 127 10*3/uL — ABNORMAL LOW (ref 150–400)
RBC: 3.51 MIL/uL — ABNORMAL LOW (ref 3.87–5.11)
RDW: 14.1 % (ref 11.5–15.5)
WBC: 3.9 10*3/uL — ABNORMAL LOW (ref 4.0–10.5)
nRBC: 0 % (ref 0.0–0.2)

## 2020-10-06 LAB — BASIC METABOLIC PANEL
Anion gap: 8 (ref 5–15)
BUN: 10 mg/dL (ref 8–23)
CO2: 29 mmol/L (ref 22–32)
Calcium: 8.9 mg/dL (ref 8.9–10.3)
Chloride: 96 mmol/L — ABNORMAL LOW (ref 98–111)
Creatinine, Ser: 0.51 mg/dL (ref 0.44–1.00)
GFR, Estimated: >60 mL/min (ref >60)
Glucose, Bld: 166 mg/dL — ABNORMAL HIGH (ref 70–99)
Potassium: 3.6 mmol/L (ref 3.5–5.1)
Sodium: 133 mmol/L — ABNORMAL LOW (ref 135–145)

## 2020-10-06 LAB — MAGNESIUM: Magnesium: 1.4 mg/dL — ABNORMAL LOW (ref 1.7–2.4)

## 2020-10-06 LAB — PHOSPHORUS: Phosphorus: 3.6 mg/dL (ref 2.5–4.6)

## 2020-10-06 LAB — RESP PANEL BY RT-PCR (FLU A&B, COVID) ARPGX2
Influenza A by PCR: NEGATIVE
Influenza B by PCR: NEGATIVE
SARS Coronavirus 2 by RT PCR: NEGATIVE

## 2020-10-06 MED ORDER — FENTANYL CITRATE (PF) 100 MCG/2ML IJ SOLN
50.0000 ug | Freq: Once | INTRAMUSCULAR | Status: AC
  Administered 2020-10-06: 50 ug via INTRAVENOUS
  Filled 2020-10-06: qty 2

## 2020-10-06 MED ORDER — ATORVASTATIN CALCIUM 20 MG PO TABS
20.0000 mg | ORAL_TABLET | Freq: Every day | ORAL | Status: DC
  Administered 2020-10-08 – 2020-10-12 (×5): 20 mg via ORAL
  Filled 2020-10-06 (×6): qty 1

## 2020-10-06 MED ORDER — HYDROMORPHONE HCL 1 MG/ML IJ SOLN
0.5000 mg | INTRAMUSCULAR | Status: DC | PRN
  Administered 2020-10-06 – 2020-10-08 (×5): 0.5 mg via INTRAVENOUS
  Filled 2020-10-06 (×2): qty 0.5
  Filled 2020-10-06: qty 1
  Filled 2020-10-06 (×2): qty 0.5

## 2020-10-06 MED ORDER — LEVOTHYROXINE SODIUM 112 MCG PO TABS
112.0000 ug | ORAL_TABLET | Freq: Every day | ORAL | Status: DC
  Administered 2020-10-08 – 2020-10-13 (×5): 112 ug via ORAL
  Filled 2020-10-06 (×7): qty 1

## 2020-10-06 MED ORDER — MAGNESIUM OXIDE -MG SUPPLEMENT 400 (240 MG) MG PO TABS
400.0000 mg | ORAL_TABLET | Freq: Two times a day (BID) | ORAL | Status: DC
  Administered 2020-10-06 – 2020-10-12 (×12): 400 mg via ORAL
  Filled 2020-10-06 (×14): qty 1

## 2020-10-06 MED ORDER — PANTOPRAZOLE SODIUM 40 MG IV SOLR
40.0000 mg | Freq: Every day | INTRAVENOUS | Status: DC
  Administered 2020-10-06 – 2020-10-08 (×2): 40 mg via INTRAVENOUS
  Filled 2020-10-06 (×2): qty 40

## 2020-10-06 MED ORDER — SODIUM CHLORIDE 0.9 % IV SOLN: Freq: Once | INTRAVENOUS | Status: AC

## 2020-10-06 MED ORDER — ACETAMINOPHEN 325 MG PO TABS: 650.0000 mg | ORAL_TABLET | Freq: Four times a day (QID) | ORAL | Status: DC | PRN

## 2020-10-06 NOTE — ED Notes (Signed)
Patient transported to X-ray 

## 2020-10-06 NOTE — ED Notes (Signed)
Pt reports her pain is increasing, EDP made aware. Verbal order for 94mg Fentanyl IV PUSH STAT once per Dr. CCassandria Anger Orders placed.

## 2020-10-06 NOTE — ED Provider Notes (Signed)
[facility] EMERGENCY DEPARTMENT Provider Note   CSN: [id number] Arrival date & time: 10/06/20  0101     History Chief Complaint  Patient presents with   Shari Branch    Shari Branch is a 75 y.o. female.  HPI     This is a 75 year old female with a history of hyperlipidemia, thyroid disease, smoking who presents with right leg pain.  Patient reports that she fell out of a chair on the right side.  She did not hit her head or lose consciousness.  She denies syncope.  She is reporting right hip and right knee pain.  It is worse with movement.  Currently she rates her pain at 10 out of 10.  Denies numbness or tingling of her legs.  Denies other injury.  No shortness of breath or chest pain.  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   COLITIS 0000000   Nonalcoholic steatohepatitis (NASH) 09/16/2008   SMOKER 04/28/2008   TINNITUS, LEFT 04/28/2008   HIP, ARTHRITIS, DEGEN./OSTEO 11/28/2007   GASTRITIS 02/15/2007   OSTEOPENIA 02/15/2007   HYPOTHYROIDISM 01/25/2007   HYPERLIPIDEMIA 01/25/2007   MIGRAINE HEADACHE 01/25/2007   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   CATARACTS 01/25/2007   ALLERGIC RHINITIS 01/25/2007   GERD (gastroesophageal reflux disease) 01/25/2007   MALAISE 01/25/2007   INCONTINENCE 01/25/2007    Past Surgical History:  Procedure Laterality Date    trigeminal nerve tumor     numbness on left side of face   ABDOMINAL HYSTERECTOMY     BREAST REDUCTION SURGERY      CATARACT EXTRACTION     bilateral   CHOLECYSTECTOMY     COLONOSCOPY  2008   Dr. Oneida Alar: poor prep, inadequate exam   COLONOSCOPY WITH ESOPHAGOGASTRODUODENOSCOPY (EGD)  2009   Dr. Oneida Alar: good prep, normal TI, normal colon, hemorrhoids, gastritis, nodule in cardia (benign)   EAR PINNA RECONSTRUCTION W/ RIB GRAFT     7 surgeries starting at age 80   ESOPHAGOGASTRODUODENOSCOPY  2008   Dr. Oneida Alar: Reflux esophagitis, large hh, gastritis.    [name] EXCISION Right 09/19/2013   Procedure: EXCISION [name] FOOT;  Surgeon: Marcheta Grammes, DPM;  Location: [facility];  Service: Podiatry;  Laterality: Right;   OSTECTOMY Right 09/19/2013   Procedure: OSTECTOMY;  Surgeon: Marcheta Grammes, DPM;  Location: [facility];  Service: Podiatry;  Laterality: Right;   RETINAL DETACHMENT SURGERY     bilateral   TONSILLECTOMY       OB History   No obstetric history on file.     Family History  Problem Relation Age of Onset   Colon cancer Paternal Grandmother 61   Pancreatic cancer Father        deceased age 56   Other Other        retinal detachment in multiple family member at early age    Social History   Tobacco Use   Smoking status:  Every Day    Packs/day: 1.50    Types: Cigarettes   Smokeless tobacco: Never  Substance Use Topics   Alcohol use: No   Drug use: No    Home Medications Prior to Admission medications   Medication Sig Start Date End Date Taking? Authorizing Provider  alendronate (FOSAMAX) 70 MG tablet Take 1 tablet (70 mg total) by mouth every 7 (seven) days. Take with a full glass of water on an empty stomach. 02/20/19   Susy Frizzle, MD  amitriptyline (ELAVIL) 50 MG tablet TAKE 3 TABLETS BY MOUTH EVERY DAY AT BEDTIME 03/02/20   Susy Frizzle, MD  atorvastatin (LIPITOR) 20 MG tablet Take 1 tablet (20 mg total) by mouth daily. 06/16/16   Susy Frizzle, MD  escitalopram (LEXAPRO) 20 MG tablet Take 1 tablet by mouth once daily 09/08/20   Susy Frizzle, MD  EUTHYROX 112 MCG tablet Take 1 tablet by mouth once daily 12/04/19   Susy Frizzle, MD  furosemide (LASIX) 20 MG tablet Take 1 tablet (20 mg total) by mouth daily as needed (leg swelling). 01/16/20   Susy Frizzle, MD  naproxen (NAPROSYN) 500 MG tablet Take 1 tablet (500 mg total) by mouth 2 (two) times daily with a meal. 02/25/20   Susy Frizzle, MD  pantoprazole (PROTONIX) 40 MG tablet Take 1 tablet by mouth twice daily 07/02/20   Susy Frizzle, MD    Allergies    Sulfonamide derivatives  Review of Systems   Review of Systems  Constitutional:  Negative for fever.  Respiratory:  Negative for shortness of breath.   Cardiovascular:  Negative for chest pain.  Musculoskeletal:        Right hip and knee pain  Neurological:  Negative for weakness and numbness.  All other systems reviewed and are negative.  Physical Exam Updated Vital Signs BP (!) 158/87   Pulse (!) 102   Temp 97.6 F (36.4 C) (Oral)   Resp 14   Ht 1.702 m ('5\' 7"'$ )   Wt 73.5 kg   SpO2 96%   BMI 25.37 kg/m   Physical Exam Vitals and nursing note reviewed.  Constitutional:      Appearance: Normal appearance. She is well-developed. She is obese.  HENT:     Head: Normocephalic and atraumatic.     Nose: Nose normal.     Mouth/Throat:     Mouth: Mucous membranes are moist.  Eyes:     Pupils: Pupils are equal, round, and reactive to light.  Cardiovascular:     Rate and Rhythm: Normal rate and regular rhythm.     Heart sounds: Normal heart sounds.  Pulmonary:     Effort: Pulmonary effort is normal. No respiratory distress.     Breath sounds: Wheezing present.  Abdominal:     General: Bowel sounds are normal.     Palpations: Abdomen is soft.  Musculoskeletal:     Cervical back: Neck supple.     Comments: Pain with range of motion of the right hip, no significant foreshortening noted, 2+ DP pulse, neurovascular intact distally, difficult to examine right knee secondary to hip pain, no  obvious effusion or overlying skin changes  Skin:    General: Skin is warm and dry.  Neurological:     Mental Status: She is alert and oriented to person, place, and time.  Psychiatric:        Mood and Affect: Mood normal.    ED Results / Procedures / Treatments  Labs (all labs ordered are listed, but only abnormal results are displayed) Labs Reviewed  CBC WITH DIFFERENTIAL/PLATELET - Abnormal; Notable for the following components:      Result Value   WBC 3.9 (*)    RBC 3.51 (*)    Hemoglobin 11.1 (*)    HCT 33.6 (*)    Platelets 127 (*)    Lymphs Abs 0.3 (*)    All other components within normal limits  BASIC METABOLIC PANEL - Abnormal; Notable for the following components:   Sodium 133 (*)    Chloride 96 (*)    Glucose, Bld 166 (*)    All other components within normal limits  RESP PANEL BY RT-PCR (FLU A&B, COVID) [id number]    EKG EKG Interpretation  Date/Time:  Tuesday October 06 2020 02:37:47 EDT Ventricular Rate:  105 PR Interval:  208 QRS Duration: 85 QT Interval:  352 QTC Calculation: 466 R Axis:   41 Text Interpretation: Sinus tachycardia Borderline prolonged PR interval Low voltage, precordial leads Abnormal R-wave progression, early transition Confirmed by Thayer Jew [phone]) on 10/06/2020 2:55:34 AM  Radiology DG Chest Portable 1 View  Result Date: 10/06/2020 CLINICAL DATA:  Preop for right hip fracture EXAM: PORTABLE CHEST 1 VIEW COMPARISON:  04/03/2018 FINDINGS: The heart size and mediastinal contours are within normal limits. Both lungs are clear. The visualized skeletal structures are unremarkable. IMPRESSION: No active disease. Electronically Signed   By: Ulyses Jarred M.D.   On: 10/06/2020 02:44   DG Knee Complete 4 Views Right  Result Date: 10/06/2020 CLINICAL DATA:  Fall, right hip fracture, right leg pain EXAM: RIGHT KNEE - COMPLETE 4+ VIEW COMPARISON:  None. FINDINGS: Four view radiograph right knee demonstrates normal alignment. No fracture or  dislocation. Mild tricompartmental degenerative arthritis. Small right knee effusion. Soft tissues are unremarkable. IMPRESSION: No acute fracture or dislocation.  Small right knee effusion. Electronically Signed   By: Fidela Salisbury MD   On: 10/06/2020 02:09   DG Hip Unilat W or Wo Pelvis 2-3 Views Right  Result Date: 10/06/2020 CLINICAL DATA:  Fall, right hip pain EXAM: DG HIP (WITH OR WITHOUT PELVIS) 2-3V RIGHT COMPARISON:  11/05/2007 FINDINGS: There is an acute, impacted subcapital right femoral neck fracture. Femoral head is still seated within the right acetabulum. Moderate superimposed right hip degenerative arthritis. Pelvis is intact visualized left hip are intact. Moderate left hip degenerative arthritis noted. Soft tissues are unremarkable. IMPRESSION: Acute impacted right subcapital femoral neck fracture. Superimposed moderate right hip degenerative arthritis. Electronically Signed   By: Fidela Salisbury MD   On: 10/06/2020 02:07    Procedures Procedures   Medications Ordered in ED Medications  fentaNYL (SUBLIMAZE) injection 50 mcg (50 mcg Intravenous Given 10/06/20 0133)    ED Course  I have reviewed the triage vital signs and the nursing notes.  Pertinent labs & imaging results that were available during my care of the patient were reviewed by me and considered in my medical decision making (see chart for details).  Clinical Course as of 10/06/20 0325  Tue Oct 06, 2020  0259 Spoke with Dr. Larena Glassman.  Discussed with patient.  We will plan for admission to the hospitalist. [CH]    Clinical Course User Index [CH] Denis Koppel, Barbette Hair, MD   MDM Rules/Calculators/A&P                           Presents with right hip and knee pain after fall.  Reports mechanical fall.  She is overall nontoxic-appearing.  Initial O2 sats noted to be high 80s on room air.  She is in no respiratory distress.  Significant history of smoking.  X-rays obtained.  Independently reviewed by myself and show a  right impacted intertrochanteric hip fracture.  Additional work-up and imaging for preoperative purposes obtained.  CBC and BMP are largely reassuring.  Chest x-ray shows no evidence of pneumothorax or pneumonia.  COVID testing is pending.  Spoke with orthopedist and we will plan for admission to the hospital.  Final Clinical Impression(s) / ED Diagnoses Final diagnoses:  Closed fracture of right hip, initial encounter [facility])    Rx / DC Orders ED Discharge Orders     None        Dina Rich, Barbette Hair, MD 10/06/20 [phone]

## 2020-10-06 NOTE — ED Triage Notes (Signed)
Pt states she fell out of chair tonight and c/o right hip/knee and pelvic pain

## 2020-10-06 NOTE — Consult Note (Signed)
 ORTHOPAEDIC CONSULTATION  REQUESTING PHYSICIAN: Pokhrel, Laxman, MD  ASSESSMENT AND PLAN: 75 y.o. female with the following: Right Hip Valgus impacted femoral neck fracture  This patient requires inpatient admission to the hospitalist, to include preoperative clearance and perioperative medical management  - Weight Bearing Status/Activity: NWB Right lower extremity  - Additional recommended labs/tests: Preop Labs: CBC, BMP, PT/INR, Chest XR, and EKG  -VTE Prophylaxis: Please hold prior to OR; to resume POD#1 at the discretion of the primary team  - Pain control: Recommend PO pain medications PRN; judicious use of narcotics  - Follow-up plan: F/u 10-14 days postop.  If she is discharged to a nursing facility, sutures/staples can be removed and Patient can follow up in clinic 6 weeks after surgery  -Procedures: Plan for OR once patient has been medically optimized  Plan for Right Hip CRPP; DOS 10/07/20  Npo at midnight.  Ok to eat and drink effective immediately from orthopaedic perspective.      Chief Complaint: Right hip pain  HPI: Shari Branch is a 75 y.o. female who presented to the ED for evaluation after sustaining a mechanical fall.  She was at home yesterday, when she fell off the stool she was sitting on.  She landed on her right hip.  She is complaining of pain in her right hip.  No pain elsewhere.  She did not hit her head.  Pain is controlled when she is not moving.  Medications are helping with the pain.  Pain worsens with movement.  No numbness or tingling.   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 Fields: poor prep, inadequate exam   COLONOSCOPY WITH ESOPHAGOGASTRODUODENOSCOPY (EGD)  2009   Dr. Fields: good prep, normal TI, normal colon, hemorrhoids, gastritis, nodule in cardia (benign)   EAR PINNA RECONSTRUCTION W/ RIB GRAFT     7 surgeries starting at age 8   ESOPHAGOGASTRODUODENOSCOPY  2008   Dr. Fields: Reflux esophagitis, large hh, gastritis.    [name] EXCISION Right 09/19/2013   Procedure: EXCISION [name] FOOT;  Surgeon: Benjamin Ivan McKinney, DPM;  Location: [facility];  Service: Podiatry;  Laterality: Right;   OSTECTOMY Right 09/19/2013   Procedure: OSTECTOMY;  Surgeon: Benjamin Ivan McKinney, DPM;  Location: [facility];  Service: Podiatry;  Laterality: Right;   RETINAL DETACHMENT SURGERY     bilateral   TONSILLECTOMY     Social History   Socioeconomic History   Marital status: Married    Spouse name: Not on file   Number of children: 3   Years of education: Not on file   Highest education level: Not on file  Occupational History   Occupation: PT - [employer]  Tobacco Use   Smoking status: Every Day    Packs/day: 1.50    Types: Cigarettes   Smokeless tobacco: Never    Substance and Sexual Activity   Alcohol use: No   Drug use: No   Sexual activity: Not on file  Other Topics Concern   Not on file  Social History Narrative   Not on file   Social Determinants of Health   Financial Resource Strain: Not on file  Food Insecurity: Not on file  Transportation Needs: Not on file  Physical Activity: Not on file  Stress: Not on file  Social Connections: Not on file   Family History  Problem Relation Age of Onset   Colon cancer Paternal Grandmother 76   Pancreatic cancer Father        deceased age 65   Other Other        retinal detachment in multiple family member at early age   Allergies  Allergen  Reactions   Sulfonamide Derivatives     REACTION: Joint Pain and locking - walks like a 75 year old   Prior to Admission medications   Medication Sig Start Date End Date Taking? Authorizing Provider  alendronate (FOSAMAX) 70 MG tablet Take 1 tablet (70 mg total) by mouth every 7 (seven) days. Take with a full glass of water on an empty stomach. 02/20/19   Susy Frizzle, MD  amitriptyline (ELAVIL) 50 MG tablet TAKE 3 TABLETS BY MOUTH EVERY DAY AT BEDTIME 03/02/20   Susy Frizzle, MD  atorvastatin (LIPITOR) 20 MG tablet Take 1 tablet (20 mg total) by mouth daily. 06/16/16   Susy Frizzle, MD  escitalopram (LEXAPRO) 20 MG tablet Take 1 tablet by mouth once daily 09/08/20   Susy Frizzle, MD  EUTHYROX 112 MCG tablet Take 1 tablet by mouth once daily 12/04/19   Susy Frizzle, MD  furosemide (LASIX) 20 MG tablet Take 1 tablet (20 mg total) by mouth daily as needed (leg swelling). 01/16/20   Susy Frizzle, MD  naproxen (NAPROSYN) 500 MG tablet Take 1 tablet (500 mg total) by mouth 2 (two) times daily with a meal. 02/25/20   Susy Frizzle, MD  pantoprazole (PROTONIX) 40 MG tablet Take 1 tablet by mouth twice daily 07/02/20   Susy Frizzle, MD   DG Chest Portable 1 View  Result Date: 10/06/2020 CLINICAL DATA:  Preop for right hip fracture EXAM: PORTABLE CHEST 1 VIEW COMPARISON:  04/03/2018 FINDINGS: The heart size and mediastinal contours are within normal limits. Both lungs are clear. The visualized skeletal structures are unremarkable. IMPRESSION: No active disease. Electronically Signed   By: Ulyses Jarred M.D.   On: 10/06/2020 02:44   DG Knee Complete 4 Views Right  Result Date: 10/06/2020 CLINICAL DATA:  Fall, right hip fracture, right leg pain EXAM: RIGHT KNEE - COMPLETE 4+ VIEW COMPARISON:  None. FINDINGS: Four view radiograph right knee demonstrates normal alignment. No fracture or dislocation. Mild tricompartmental degenerative arthritis. Small right knee effusion. Soft  tissues are unremarkable. IMPRESSION: No acute fracture or dislocation.  Small right knee effusion. Electronically Signed   By: Fidela Salisbury MD   On: 10/06/2020 02:09   DG Hip Unilat W or Wo Pelvis 2-3 Views Right  Result Date: 10/06/2020 CLINICAL DATA:  Fall, right hip pain EXAM: DG HIP (WITH OR WITHOUT PELVIS) 2-3V RIGHT COMPARISON:  11/05/2007 FINDINGS: There is an acute, impacted subcapital right femoral neck fracture. Femoral head is still seated within the right acetabulum. Moderate superimposed right hip degenerative arthritis. Pelvis is intact visualized left hip are intact. Moderate left hip degenerative arthritis noted. Soft tissues are unremarkable. IMPRESSION:  Acute impacted right subcapital femoral neck fracture. Superimposed moderate right hip degenerative arthritis. Electronically Signed   By: Ashesh  Parikh MD   On: 10/06/2020 02:07   Family History Reviewed and non-contributory, no pertinent history of problems with bleeding or anesthesia    Review of Systems No fevers or chills No numbness or tingling No chest pain No shortness of breath No bowel or bladder dysfunction No GI distress No headaches    OBJECTIVE  Vitals:Patient Vitals for the past 8 hrs:  BP Temp Temp src Pulse Resp SpO2  10/06/20 1230 (!) 174/100 -- -- (!) 104 12 94 %  10/06/20 1030 (!) 171/108 -- -- (!) 108 12 96 %  10/06/20 0700 (!) 149/100 -- -- (!) 109 13 93 %  10/06/20 0630 (!) 155/93 98.1 F (36.7 C) Oral (!) 108 17 96 %  10/06/20 0600 (!) 169/102 -- -- (!) 105 15 97 %  10/06/20 0530 (!) 168/104 -- -- (!) 106 16 97 %   General: Alert, no acute distress Cardiovascular: Extremities are warm Respiratory: No cyanosis, no use of accessory musculature Skin: No lesions in the area of chief complaint  Neurologic: Sensation intact distally  Psychiatric: Patient is competent for consent with normal mood and affect Lymphatic: No swelling obvious and reported other than the area involved in the exam  below Extremities  RLE: Extremity held in a fixed position.  ROM deferred due to known fracture.  Sensation is intact distally in the sural, saphenous, DP, SP, and plantar nerve distribution. 2+ DP pulse.  Toes are WWP.  Active motion intact in the TA/EHL/GS. LLE: Sensation is intact distally in the sural, saphenous, DP, SP, and plantar nerve distribution. 2+ DP pulse.  Toes are WWP.  Active motion intact in the TA/EHL/GS. Tolerates gentle ROM of the hip.  No pain with axial loading.     Test Results Imaging XR of the Right hip demonstrates a Valgus impacted femoral neck fracture.  She has some degenerative changes in the hip joint as well.   Labs cbc Recent Labs    10/06/20 0244  WBC 3.9*  HGB 11.1*  HCT 33.6*  PLT 127*    Labs inflam No results for input(s): CRP in the last 72 hours.  Invalid input(s): ESR  Labs coag No results for input(s): INR, PTT in the last 72 hours.  Invalid input(s): PT  Recent Labs    10/06/20 0244  NA 133*  K 3.6  CL 96*  CO2 29  GLUCOSE 166*  BUN 10  CREATININE 0.51  CALCIUM 8.9     

## 2020-10-06 NOTE — ED Notes (Signed)
Pt and visitor updated on pt pending admission. Awaiting update from orthopedic surgery. Pt medicated per MAR. Resting comfortably in bed. Visitor wheeled off unit, reports he will come back later in afternoon to visit pt. Will continue to monitor pt.

## 2020-10-06 NOTE — H&P (Signed)
History and Physical  Shari Branch [id number] DOB: Jul 23, 1945 DOA: 10/06/2020  Referring physician: Merryl Hacker, MD  PCP: Susy Frizzle, MD  Patient coming from: Home  Chief Complaint: Fall  HPI: Shari Branch is a 75 y.o. female with medical history significant for GERD, hypothyroidism, hyperlipidemia, meningioma and tobacco use who presents to the emergency department due to right hip pain, patient states that she fell out of a chair and landed on her right side, she had difficulty in being able to bear weight on the leg due to pain which was rated as 10/10 on pain scale.  She denies hitting her head or losing consciousness.  Right hip pain was aggravated with movement of right leg. EMS was activated and patient was taken to the ED for further evaluation and management.  ED Course:  In the emergency department, she was tachycardic, BP was 164/96.  O2 sat was 92 to 97% on supplemental oxygen via [place] at 2 LPM.  Work-up in the ED showed pancytopenia, mild hyponatremia, hyperglycemia.  Influenza A, B, SARS coronavirus 2 was negative Chest x-ray showed no active disease. Right knee x-ray showed no acute fracture or dislocation, but showed small right knee effusion. Right hip x-ray showed an acute impacted right subcapital femoral neck fracture. Superimposed moderate right hip degenerative arthritis. IV fentanyl was given for pain, orthopedic surgeon was consulted and will see the patient in the morning per ED physician.  Review of Systems: Constitutional: Negative for chills and fever.  HENT: Negative for ear pain and sore throat.   Eyes: Negative for pain and visual disturbance.  Respiratory: Negative for cough, chest tightness and shortness of breath.   Cardiovascular: Negative for chest pain and palpitations.  Gastrointestinal: Negative for abdominal pain and vomiting.  Endocrine: Negative for polyphagia and polyuria.  Genitourinary: Negative for decreased urine volume,  dysuria, enuresis Musculoskeletal: Positive for right hip and knee pain and negative for back pain.  Skin: Negative for color change and rash.  Allergic/Immunologic: Negative for immunocompromised state.  Neurological: Negative for tremors, syncope, speech difficulty  Hematological: Does not bruise/bleed easily.  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 Oneida Alar: poor prep, inadequate exam   COLONOSCOPY WITH ESOPHAGOGASTRODUODENOSCOPY (EGD)  2009   Dr. Oneida Alar: good prep, normal TI, normal colon, hemorrhoids, gastritis, nodule in cardia (benign)   EAR PINNA RECONSTRUCTION W/ RIB GRAFT     7 surgeries starting at age 80   ESOPHAGOGASTRODUODENOSCOPY  2008   Dr. Oneida Alar: Reflux esophagitis, large hh, gastritis.    [name] EXCISION Right 09/19/2013   Procedure: EXCISION [name] FOOT;  Surgeon: Marcheta Grammes, DPM;  Location: [facility];  Service: Podiatry;  Laterality: Right;   OSTECTOMY Right 09/19/2013   Procedure: OSTECTOMY;  Surgeon: Leanna Sato  Caprice Beaver, DPM;  Location: [facility];  Service: Podiatry;  Laterality: Right;   RETINAL DETACHMENT SURGERY     bilateral   TONSILLECTOMY      Social History:  reports that she has been smoking cigarettes. She has been smoking an average of 1.5 packs per day. She has never used smokeless tobacco. She reports that she does not drink alcohol and does not use  drugs.   Allergies  Allergen Reactions   Sulfonamide Derivatives     REACTION: Joint Pain and locking - walks like a 75 year old    Family History  Problem Relation Age of Onset   Colon cancer Paternal Grandmother 57   Pancreatic cancer Father        deceased age 71   Other Other        retinal detachment in multiple family member at early age     Prior to Admission medications   Medication Sig Start Date End Date Taking? Authorizing Provider  alendronate (FOSAMAX) 70 MG tablet Take 1 tablet (70 mg total) by mouth every 7 (seven) days. Take with a full glass of water on an empty stomach. 02/20/19   Susy Frizzle, MD  amitriptyline (ELAVIL) 50 MG tablet TAKE 3 TABLETS BY MOUTH EVERY DAY AT BEDTIME 03/02/20   Susy Frizzle, MD  atorvastatin (LIPITOR) 20 MG tablet Take 1 tablet (20 mg total) by mouth daily. 06/16/16   Susy Frizzle, MD  escitalopram (LEXAPRO) 20 MG tablet Take 1 tablet by mouth once daily 09/08/20   Susy Frizzle, MD  EUTHYROX 112 MCG tablet Take 1 tablet by mouth once daily 12/04/19   Susy Frizzle, MD  furosemide (LASIX) 20 MG tablet Take 1 tablet (20 mg total) by mouth daily as needed (leg swelling). 01/16/20   Susy Frizzle, MD  naproxen (NAPROSYN) 500 MG tablet Take 1 tablet (500 mg total) by mouth 2 (two) times daily with a meal. 02/25/20   Susy Frizzle, MD  pantoprazole (PROTONIX) 40 MG tablet Take 1 tablet by mouth twice daily 07/02/20   Susy Frizzle, MD    Physical Exam: BP (!) 158/87   Pulse (!) 102   Temp 97.6 F (36.4 C) (Oral)   Resp 14   Ht '5\' 7"'$  (1.702 m)   Wt 73.5 kg   SpO2 96%   BMI 25.37 kg/m   General: 75 y.o. year-old female well developed well nourished in no acute distress.  Alert and oriented x3. HEENT: NCAT, EOMI Neck: Supple, trachea medial Cardiovascular: Regular rate and rhythm with no rubs or gallops.  No thyromegaly or JVD noted.  No lower extremity edema. 2/4 pulses in all 4  extremities. Respiratory: Clear to auscultation with no wheezes or rales. Good inspiratory effort. Abdomen: Soft, nontender nondistended with normal bowel sounds x4 quadrants. Muskuloskeletal: Decreased range of motion of right hip due to pain.  Tender to palpation of right hip.  No cyanosis or clubbing noted bilaterally Neuro: CN II-XII intact, strength 5/5 x 4, sensation, reflexes intact Skin: No ulcerative lesions noted or rashes Psychiatry: Mood is appropriate for condition and setting          Labs on Admission:  Basic Metabolic Panel: Recent Labs  Lab 10/06/20 0244  NA 133*  K 3.6  CL 96*  CO2 29  GLUCOSE 166*  BUN 10  CREATININE 0.51  CALCIUM 8.9   Liver Function Tests: No results for input(s): AST, ALT, ALKPHOS, BILITOT, PROT, ALBUMIN in the last  168 hours. No results for input(s): LIPASE, AMYLASE in the last 168 hours. No results for input(s): AMMONIA in the last 168 hours. CBC: Recent Labs  Lab 10/06/20 0244  WBC 3.9*  NEUTROABS 3.4  HGB 11.1*  HCT 33.6*  MCV 95.7  PLT 127*   Cardiac Enzymes: No results for input(s): CKTOTAL, CKMB, CKMBINDEX, TROPONINI in the last 168 hours.  BNP (last 3 results) No results for input(s): BNP in the last 8760 hours.  ProBNP (last 3 results) No results for input(s): PROBNP in the last 8760 hours.  CBG: No results for input(s): GLUCAP in the last 168 hours.  Radiological Exams on Admission: DG Chest Portable 1 View  Result Date: 10/06/2020 CLINICAL DATA:  Preop for right hip fracture EXAM: PORTABLE CHEST 1 VIEW COMPARISON:  04/03/2018 FINDINGS: The heart size and mediastinal contours are within normal limits. Both lungs are clear. The visualized skeletal structures are unremarkable. IMPRESSION: No active disease. Electronically Signed   By: Ulyses Jarred M.D.   On: 10/06/2020 02:44   DG Knee Complete 4 Views Right  Result Date: 10/06/2020 CLINICAL DATA:  Fall, right hip fracture, right leg pain EXAM: RIGHT KNEE -  COMPLETE 4+ VIEW COMPARISON:  None. FINDINGS: Four view radiograph right knee demonstrates normal alignment. No fracture or dislocation. Mild tricompartmental degenerative arthritis. Small right knee effusion. Soft tissues are unremarkable. IMPRESSION: No acute fracture or dislocation.  Small right knee effusion. Electronically Signed   By: Fidela Salisbury MD   On: 10/06/2020 02:09   DG Hip Unilat W or Wo Pelvis 2-3 Views Right  Result Date: 10/06/2020 CLINICAL DATA:  Fall, right hip pain EXAM: DG HIP (WITH OR WITHOUT PELVIS) 2-3V RIGHT COMPARISON:  11/05/2007 FINDINGS: There is an acute, impacted subcapital right femoral neck fracture. Femoral head is still seated within the right acetabulum. Moderate superimposed right hip degenerative arthritis. Pelvis is intact visualized left hip are intact. Moderate left hip degenerative arthritis noted. Soft tissues are unremarkable. IMPRESSION: Acute impacted right subcapital femoral neck fracture. Superimposed moderate right hip degenerative arthritis. Electronically Signed   By: Fidela Salisbury MD   On: 10/06/2020 02:07    EKG: I independently viewed the EKG done and my findings are as followed: Sinus tachycardia at a rate of 105 bpm  Assessment/Plan Present on Admission:  Closed displaced fracture of right femoral neck (HCC)  Hypothyroidism  GERD (gastroesophageal reflux disease)  Principal Problem:   Closed displaced fracture of right femoral neck ([place]) Active Problems:   Hypothyroidism   Hyperlipidemia   Tobacco use   GERD (gastroesophageal reflux disease)   Pancytopenia (HCC)   Hyponatremia   Hyperglycemia   Meningioma ([place])   Fall at home, initial encounter   Acute impacted right subcapital femoral neck fracture due to fall at home Right hip x-ray showed an acute impacted right subcapital femoral neck fracture. Tylenol 650 mg every 6 hours as needed Continue IV Dilaudid 0.5 mg every 4 hours as needed moderate/severe pain Continue fall  precaution and neurochecks Patient will be kept n.p.o. in anticipation for possible surgical intervention in the morning Continue fall precaution and neurochecks Continue PT/OT eval and treat after surgical intervention Orthopedic surgeon was consulted and will see patient in the morning per ED physician  Pancytopenia WBC 3.9, hemoglobin 11.1, platelets 127 Continue monitoring patient and monitor CBC with morning labs  Hyponatremia possibly reactive Na  133, continue IV hydration Continue to monitor sodium levels  Hyperglycemia CBG 166, continue to monitor blood glucose level  Hypothyroidism  Continue Synthroid  Hyperlipidemia Continue Lipitor  GERD Continue Protonix  Meningioma Patient follows with [facility]  Tobacco use Patient counseled on tobacco abuse cessation   DVT prophylaxis: SCDs  Code Status: Full code  Family Communication: None at bedside  Disposition Plan:  Patient is from:                        home Anticipated DC to:                   SNF or family members home Anticipated DC date:               2-3 days Anticipated DC barriers:          Patient requires inpatient management due to right femoral neck fracture which required surgical repair  Consults called: Orthopedic surgery  Admission status: Inpatient    Bernadette Hoit MD [facility]  10/06/2020, 5:15 AM

## 2020-10-06 NOTE — Progress Notes (Signed)
Same day note  Patient seen and examined at bedside.  Patient was admitted to the hospital for fall.  At the time of my evaluation, patient complains of hip pain.  Physical examination reveals on nasal cannula, right leg shortened and externally rotated.  Laboratory data and imaging was reviewed  Assessment and Plan.  Acute impacted right subcapital femoral neck fracture due to fall at home Right hip x-ray showed an acute impacted right subcapital femoral neck fracture.  Continue supportive care.  Fall precautions.  Orthopedic has been consulted.  Plan for surgical intervention likely tomorrow.  Communicated with orthopedic surgery.  Pancytopenia Continue to monitor.  Hypomagnesemia.  We will replenish.  Hyponatremia  Mild.  Continue to monitor.  Hyperglycemia Continue to monitor closely  Hypothyroidism Continue Synthroid  Hyperlipidemia Continue Lipitor  GERD Continue Protonix  Meningioma Patient follows with [facility]  Tobacco use Patient counseled on tobacco abuse cessation  No Charge  Signed,  Shari Pereyra, MD [facility]

## 2020-10-07 ENCOUNTER — Inpatient Hospital Stay (HOSPITAL_COMMUNITY): Payer: Medicare Other

## 2020-10-07 ENCOUNTER — Encounter (HOSPITAL_COMMUNITY): Payer: Self-pay | Admitting: Internal Medicine

## 2020-10-07 ENCOUNTER — Inpatient Hospital Stay (HOSPITAL_COMMUNITY): Payer: Medicare Other | Admitting: Certified Registered"

## 2020-10-07 ENCOUNTER — Encounter (HOSPITAL_COMMUNITY)
Admission: EM | Disposition: A | Discharge status: Skilled Nursing Facility | Payer: Self-pay | Source: Home / Self Care | Attending: Internal Medicine

## 2020-10-07 DIAGNOSIS — K219 Gastro-esophageal reflux disease without esophagitis: Secondary | ICD-10-CM | POA: Diagnosis not present

## 2020-10-07 DIAGNOSIS — S72001A Fracture of unspecified part of neck of right femur, initial encounter for closed fracture: Secondary | ICD-10-CM | POA: Diagnosis not present

## 2020-10-07 DIAGNOSIS — E039 Hypothyroidism, unspecified: Secondary | ICD-10-CM | POA: Diagnosis not present

## 2020-10-07 DIAGNOSIS — E871 Hypo-osmolality and hyponatremia: Secondary | ICD-10-CM | POA: Diagnosis not present

## 2020-10-07 HISTORY — PX: HIP PINNING,CANNULATED: SHX1758

## 2020-10-07 LAB — CBC
HCT: 34.9 % — ABNORMAL LOW (ref 36.0–46.0)
Hemoglobin: 11.6 g/dL — ABNORMAL LOW (ref 12.0–15.0)
MCH: 31.5 pg (ref 26.0–34.0)
MCHC: 33.2 g/dL (ref 30.0–36.0)
MCV: 94.8 fL (ref 80.0–100.0)
Platelets: 120 10*3/uL — ABNORMAL LOW (ref 150–400)
RBC: 3.68 MIL/uL — ABNORMAL LOW (ref 3.87–5.11)
RDW: 13.9 % (ref 11.5–15.5)
WBC: 3.9 10*3/uL — ABNORMAL LOW (ref 4.0–10.5)
nRBC: 0 % (ref 0.0–0.2)

## 2020-10-07 LAB — COMPREHENSIVE METABOLIC PANEL
ALT: 18 U/L (ref 0–44)
AST: 23 U/L (ref 15–41)
Albumin: 3.7 g/dL (ref 3.5–5.0)
Alkaline Phosphatase: 118 U/L (ref 38–126)
Anion gap: 8 (ref 5–15)
BUN: 9 mg/dL (ref 8–23)
CO2: 30 mmol/L (ref 22–32)
Calcium: 9 mg/dL (ref 8.9–10.3)
Chloride: 95 mmol/L — ABNORMAL LOW (ref 98–111)
Creatinine, Ser: 0.34 mg/dL — ABNORMAL LOW (ref 0.44–1.00)
GFR, Estimated: >60 mL/min (ref >60)
Glucose, Bld: 127 mg/dL — ABNORMAL HIGH (ref 70–99)
Potassium: 3.6 mmol/L (ref 3.5–5.1)
Sodium: 133 mmol/L — ABNORMAL LOW (ref 135–145)
Total Bilirubin: 1.6 mg/dL — ABNORMAL HIGH (ref 0.3–1.2)
Total Protein: 6.6 g/dL (ref 6.5–8.1)

## 2020-10-07 LAB — PROTIME-INR
INR: 1 (ref 0.8–1.2)
Prothrombin Time: 13.2 seconds (ref 11.4–15.2)

## 2020-10-07 LAB — APTT: aPTT: 32 seconds (ref 24–36)

## 2020-10-07 LAB — SURGICAL PCR SCREEN
MRSA, PCR: NEGATIVE
Staphylococcus aureus: NEGATIVE

## 2020-10-07 SURGERY — FIXATION, FEMUR, NECK, PERCUTANEOUS, USING SCREW
Anesthesia: Spinal | Site: Hip | Laterality: Right

## 2020-10-07 MED ORDER — SODIUM CHLORIDE 0.9 % IV SOLN: Freq: Once | INTRAVENOUS | Status: AC

## 2020-10-07 MED ORDER — TRANEXAMIC ACID-NACL 1000-0.7 MG/100ML-% IV SOLN
1000.0000 mg | INTRAVENOUS | Status: AC
  Administered 2020-10-07: 1000 mg via INTRAVENOUS
  Filled 2020-10-07: qty 100

## 2020-10-07 MED ORDER — HYDROMORPHONE HCL 1 MG/ML IJ SOLN: 0.2500 mg | INTRAMUSCULAR | Status: DC | PRN

## 2020-10-07 MED ORDER — IPRATROPIUM-ALBUTEROL 0.5-2.5 (3) MG/3ML IN SOLN: 3.0000 mL | Freq: Once | RESPIRATORY_TRACT | Status: AC

## 2020-10-07 MED ORDER — SODIUM CHLORIDE 0.9 % IV SOLN
INTRAVENOUS | Status: DC | PRN
  Administered 2020-10-07: 10 ug/min via INTRAVENOUS

## 2020-10-07 MED ORDER — CEFAZOLIN SODIUM-DEXTROSE 2-4 GM/100ML-% IV SOLN
2.0000 g | Freq: Three times a day (TID) | INTRAVENOUS | Status: AC
  Administered 2020-10-07 – 2020-10-08 (×3): 2 g via INTRAVENOUS
  Filled 2020-10-07 (×3): qty 100

## 2020-10-07 MED ORDER — ORAL CARE MOUTH RINSE: 15.0000 mL | Freq: Once | OROMUCOSAL | Status: AC

## 2020-10-07 MED ORDER — FENTANYL CITRATE (PF) 100 MCG/2ML IJ SOLN
INTRAMUSCULAR | Status: AC
  Filled 2020-10-07: qty 2

## 2020-10-07 MED ORDER — PHENYLEPHRINE HCL-NACL 20-0.9 MG/250ML-% IV SOLN
INTRAVENOUS | Status: AC
  Filled 2020-10-07: qty 250

## 2020-10-07 MED ORDER — KETAMINE HCL 10 MG/ML IJ SOLN
INTRAMUSCULAR | Status: DC | PRN
  Administered 2020-10-07: 20 mg via INTRAVENOUS

## 2020-10-07 MED ORDER — MAGNESIUM SULFATE 2 GM/50ML IV SOLN
2.0000 g | Freq: Once | INTRAVENOUS | Status: AC
  Administered 2020-10-07: 2 g via INTRAVENOUS
  Filled 2020-10-07: qty 50

## 2020-10-07 MED ORDER — ONDANSETRON HCL 4 MG/2ML IJ SOLN: 4.0000 mg | Freq: Once | INTRAMUSCULAR | Status: DC | PRN

## 2020-10-07 MED ORDER — KETAMINE HCL 50 MG/5ML IJ SOSY
PREFILLED_SYRINGE | INTRAMUSCULAR | Status: AC
  Filled 2020-10-07: qty 5

## 2020-10-07 MED ORDER — CHLORHEXIDINE GLUCONATE CLOTH 2 % EX PADS
6.0000 | MEDICATED_PAD | Freq: Every day | CUTANEOUS | Status: DC
  Administered 2020-10-08 – 2020-10-09 (×2): 6 via TOPICAL

## 2020-10-07 MED ORDER — LACTATED RINGERS IV SOLN: INTRAVENOUS | Status: DC

## 2020-10-07 MED ORDER — FENTANYL CITRATE (PF) 100 MCG/2ML IJ SOLN
INTRAMUSCULAR | Status: DC | PRN
  Administered 2020-10-07: 25 ug via INTRAVENOUS
  Administered 2020-10-07: 50 ug via INTRAVENOUS

## 2020-10-07 MED ORDER — IPRATROPIUM-ALBUTEROL 0.5-2.5 (3) MG/3ML IN SOLN
RESPIRATORY_TRACT | Status: AC
  Administered 2020-10-07: 3 mL via RESPIRATORY_TRACT
  Filled 2020-10-07: qty 3

## 2020-10-07 MED ORDER — CEFAZOLIN SODIUM-DEXTROSE 2-4 GM/100ML-% IV SOLN
2.0000 g | INTRAVENOUS | Status: AC
  Administered 2020-10-07: 2 g via INTRAVENOUS
  Filled 2020-10-07: qty 100

## 2020-10-07 MED ORDER — 0.9 % SODIUM CHLORIDE (POUR BTL) OPTIME
TOPICAL | Status: DC | PRN
  Administered 2020-10-07: 1000 mL

## 2020-10-07 MED ORDER — PROPOFOL 10 MG/ML IV BOLUS
INTRAVENOUS | Status: DC | PRN
  Administered 2020-10-07: 30 mg via INTRAVENOUS
  Administered 2020-10-07 (×2): 20 mg via INTRAVENOUS

## 2020-10-07 MED ORDER — PROPOFOL 500 MG/50ML IV EMUL
INTRAVENOUS | Status: DC | PRN
  Administered 2020-10-07: 35 ug/kg/min via INTRAVENOUS

## 2020-10-07 MED ORDER — PHENYLEPHRINE HCL (PRESSORS) 10 MG/ML IV SOLN
INTRAVENOUS | Status: DC | PRN
  Administered 2020-10-07 (×3): 80 ug via INTRAVENOUS
  Administered 2020-10-07: 60 ug via INTRAVENOUS

## 2020-10-07 MED ORDER — CHLORHEXIDINE GLUCONATE 0.12 % MT SOLN
15.0000 mL | Freq: Once | OROMUCOSAL | Status: AC
  Administered 2020-10-07: 15 mL via OROMUCOSAL

## 2020-10-07 MED ORDER — BUPIVACAINE HCL (PF) 0.5 % IJ SOLN
INTRAMUSCULAR | Status: AC
  Filled 2020-10-07: qty 30

## 2020-10-07 SURGICAL SUPPLY — 47 items
APL PRP STRL LF DISP 70% ISPRP (MISCELLANEOUS) ×1
BAG HAMPER (MISCELLANEOUS) ×2 IMPLANT
BIT DRILL CANN 5.0 (BIT) ×1 IMPLANT
BLADE SURG SZ10 CARB STEEL (BLADE) ×4 IMPLANT
BNDG GAUZE ELAST 4 BULKY (GAUZE/BANDAGES/DRESSINGS) ×2 IMPLANT
CHLORAPREP W/TINT 26 (MISCELLANEOUS) ×2 IMPLANT
CLOTH BEACON ORANGE TIMEOUT ST (SAFETY) ×2 IMPLANT
COVER LIGHT HANDLE STERIS (MISCELLANEOUS) ×4 IMPLANT
COVER MAYO STAND XLG (MISCELLANEOUS) ×1 IMPLANT
COVER PERINEAL POST (MISCELLANEOUS) ×2 IMPLANT
DRAPE STERI IOBAN 125X83 (DRAPES) ×2 IMPLANT
DRSG MEPILEX SACRM 8.7X9.8 (GAUZE/BANDAGES/DRESSINGS) ×2 IMPLANT
DRSG TEGADERM 4X4.75 (GAUZE/BANDAGES/DRESSINGS) ×4 IMPLANT
GAUZE SPONGE 4X4 12PLY STRL (GAUZE/BANDAGES/DRESSINGS) ×2 IMPLANT
GLOVE SKINSENSE NS SZ8.0 LF (GLOVE) ×2
GLOVE SKINSENSE STRL SZ8.0 LF (GLOVE) ×2 IMPLANT
GLOVE SRG 8 PF TXTR STRL LF DI (GLOVE) ×1 IMPLANT
GLOVE SURG UNDER POLY LF SZ7 (GLOVE) ×4 IMPLANT
GLOVE SURG UNDER POLY LF SZ8 (GLOVE) ×2
GOWN STRL REUS W/ TWL XL LVL3 (GOWN DISPOSABLE) ×1 IMPLANT
GOWN STRL REUS W/TWL LRG LVL3 (GOWN DISPOSABLE) ×4 IMPLANT
GOWN STRL REUS W/TWL XL LVL3 (GOWN DISPOSABLE) ×2
INST SET MAJOR BONE (KITS) ×2 IMPLANT
KIT BLADEGUARD II DBL (SET/KITS/TRAYS/PACK) ×2 IMPLANT
KIT TURNOVER CYSTO (KITS) ×2 IMPLANT
MANIFOLD NEPTUNE II (INSTRUMENTS) ×2 IMPLANT
MARKER SKIN DUAL TIP RULER LAB (MISCELLANEOUS) ×2 IMPLANT
NDL HYPO 21X1.5 SAFETY (NEEDLE) ×1 IMPLANT
NEEDLE HYPO 21X1.5 SAFETY (NEEDLE) ×2 IMPLANT
NS IRRIG 1000ML POUR BTL (IV SOLUTION) ×2 IMPLANT
PACK BASIC III (CUSTOM PROCEDURE TRAY) ×2
PACK SRG BSC III STRL LF ECLPS (CUSTOM PROCEDURE TRAY) ×1 IMPLANT
PAD ABD 5X9 TENDERSORB (GAUZE/BANDAGES/DRESSINGS) ×2 IMPLANT
PENCIL SMOKE EVACUATOR COATED (MISCELLANEOUS) ×2 IMPLANT
PIN GUIDE THRD AR 3.2X330 (PIN) ×3 IMPLANT
SCREW CANN 7X80 (Screw) ×3 IMPLANT
SET BASIN LINEN APH (SET/KITS/TRAYS/PACK) ×2 IMPLANT
SPONGE T-LAP 18X18 ~~LOC~~+RFID (SPONGE) ×4 IMPLANT
STRIP CLOSURE SKIN 1/2X4 (GAUZE/BANDAGES/DRESSINGS) ×1 IMPLANT
SUT MNCRL AB 4-0 PS2 18 (SUTURE) ×2 IMPLANT
SUT MON AB 2-0 CT1 36 (SUTURE) ×2 IMPLANT
SUT VIC AB 0 CT1 27 (SUTURE) ×2
SUT VIC AB 0 CT1 27XBRD ANTBC (SUTURE) ×1 IMPLANT
SYR 30ML LL (SYRINGE) ×2 IMPLANT
SYR BULB IRRIG 60ML STRL (SYRINGE) ×4 IMPLANT
WASHER FLAT 7.0 (Washer) ×3 IMPLANT
YANKAUER SUCT BULB TIP 10FT TU (MISCELLANEOUS) ×2 IMPLANT
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
## 2020-10-07 NOTE — NC FL2 (Signed)
[place] LEVEL OF CARE SCREENING TOOL     IDENTIFICATION  Patient Name: Shari Branch Birthdate: 11-09-1945 Sex: female Admission Date (Current Location): 10/06/2020  [facility] and [place] Number:  [employer] and Address:  [facility] [address], [place]      Provider Number: [id number]  Attending Physician Name and Address:  Bonnielee Haff, MD  Relative Name and Phone Number:       Current Level of Care: Hospital Recommended Level of Care: [place] Prior Approval Number:    Date Approved/Denied:   PASRR Number: [id number]  Discharge Plan: SNF    Current Diagnoses: Patient Active Problem List   Diagnosis Date Noted   Closed displaced fracture of right femoral neck ([place]) 10/06/2020   Pancytopenia ([place]) 10/06/2020   Hyponatremia 10/06/2020   Hyperglycemia 10/06/2020   Meningioma ([place]) 10/06/2020   Fall at home, initial encounter 10/06/2020   Constipation 02/17/2014   Liver hemangioma    Chest pain 10/29/2012   Bacterial conjunctivitis of left eye 09/17/2012   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   COLITIS 0000000   Nonalcoholic steatohepatitis ([facility]) 09/16/2008   Tobacco use 04/28/2008   TINNITUS, LEFT 04/28/2008   HIP, ARTHRITIS, DEGEN./OSTEO 11/28/2007   GASTRITIS 02/15/2007   OSTEOPENIA 02/15/2007   Hypothyroidism 01/25/2007   Hyperlipidemia 01/25/2007   MIGRAINE HEADACHE 01/25/2007   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 See d/c summary for updates.)   AMBULATORY STATUS COMMUNICATION OF NEEDS Skin   Extensive Assist Verbally Surgical wounds                       Personal Care Assistance Level of Assistance  Bathing, Feeding, Dressing Bathing Assistance: Maximum assistance Feeding assistance: Limited assistance Dressing Assistance: Maximum assistance     Functional Limitations Info  Sight, Speech, Hearing Sight Info: Adequate Hearing Info: Adequate Speech Info: Adequate    SPECIAL CARE FACTORS FREQUENCY  PT (By licensed PT)     PT Frequency: 5x weekly              Contractures      Additional Factors Info  Code Status, Allergies, Psychotropic Code Status Info: Full code Allergies Info: Sulfonamide Derivatives Psychotropic Info: Lexapro         Current Medications (10/07/2020):  This is the current hospital active medication list Current Facility-Administered Medications  Medication Dose Route Frequency Provider Last Rate Last Admin   acetaminophen (TYLENOL) tablet 650 mg  650 mg Oral Q6H PRN Adefeso, Oladapo, DO       atorvastatin (LIPITOR) tablet 20 mg  20 mg Oral Daily Adefeso, Oladapo, DO       ceFAZolin (ANCEF) IVPB 2g/100 mL premix  2 g Intravenous On Call to OR Mordecai Rasmussen, MD       HYDROmorphone (DILAUDID) injection 0.5 mg  0.5 mg Intravenous Q4H PRN Adefeso, Oladapo, DO   0.5 mg at 10/06/20 2130   levothyroxine (SYNTHROID) tablet 112 mcg  112 mcg Oral Daily Adefeso, Oladapo, DO  magnesium oxide (MAG-OX) tablet 400 mg  400 mg Oral BID Pokhrel, Laxman, MD   400 mg at 10/06/20 2130   pantoprazole (PROTONIX) injection 40 mg  40 mg Intravenous Daily Adefeso, Oladapo, DO   40 mg at 10/06/20 0631   tranexamic acid (CYKLOKAPRON) IVPB 1,000 mg  1,000 mg Intravenous To OR Mordecai Rasmussen, MD         Discharge Medications: Please see discharge summary for a list of discharge medications.  Relevant Imaging Results:  Relevant Lab Results:   Additional Information [place] vaccines 06/11/19,  07/09/19. SSN: [id number].  Salome Arnt, LCSW

## 2020-10-07 NOTE — Anesthesia Preprocedure Evaluation (Addendum)
Anesthesia Evaluation  Patient identified by MRN, date of birth, ID band Patient awake    Reviewed: Allergy & Precautions, NPO status , Patient's Chart, lab work & pertinent test results  History of Anesthesia Complications Negative for: history of anesthetic complications  Airway Mallampati: II  TM Distance: >3 FB Neck ROM: Full    Dental  (+) Edentulous Upper, Edentulous Lower   Pulmonary Current Smoker (heavy smoker,2 packs/day),    Pulmonary exam normal breath sounds clear to auscultation       Cardiovascular Exercise Tolerance: Good  Rhythm:Regular     Neuro/Psych  Headaches,  Neuromuscular disease    GI/Hepatic GERD  Medicated,(+) Hepatitis -  Endo/Other  Hypothyroidism   Renal/GU      Musculoskeletal  (+) Arthritis  (back pain),   Abdominal Normal abdominal exam  (+)   Peds  Hematology  (+) anemia ,   Anesthesia Other Findings   Reproductive/Obstetrics                            Anesthesia Physical Anesthesia Plan  ASA: 3  Anesthesia Plan: General/Spinal   Post-op Pain Management:    Induction:   PONV Risk Score and Plan: 4 or greater and Ondansetron and Dexamethasone  Airway Management Planned: Nasal Cannula, Natural Airway and Simple Face Mask  Additional Equipment:   Intra-op Plan:   Post-operative Plan:   Informed Consent: I have reviewed the patients History and Physical, chart, labs and discussed the procedure including the risks, benefits and alternatives for the proposed anesthesia with the patient or authorized representative who has indicated his/her understanding and acceptance.       Plan Discussed with: CRNA and Surgeon  Anesthesia Plan Comments:        Anesthesia Quick Evaluation

## 2020-10-07 NOTE — Progress Notes (Signed)
   ORTHOPAEDIC PROGRESS NOTE  Scheduled for Procedure(s): CRPP of right femoral neck fracture   DOS: 10/07/20  SUBJECTIVE: No issues over night.  Pain is controlled when she doesn't move.  NPO since midnight.  Ready for surgery.   OBJECTIVE: PE:  Alert and oriented.  No acute distress  Active motion intact to toes and ankle Sensation intact to dorsum of the foot 2+ DP pulse NO bruising over the lateral thigh.   Vitals:   10/06/20 2113 10/07/20 0454  BP: (!) 169/97 124/72  Pulse: (!) 106 (!) 106  Resp:    Temp: 98.1 F (36.7 C) 98.8 F (37.1 C)  SpO2: 92% 95%     ASSESSMENT: Shari Branch is a 75 y.o. female doing well, ready for OR; NPO since midnight PLAN: Weightbearing: NWB RLE Insicional and dressing care: Reinforce dressings as needed; none currently Orthopedic device(s): None VTE prophylaxis: At discretion of medicine team; no orthopaedic contraindications Pain control: PO medications as needed; judicious use of narcotics Follow - up plan: 2 weeks postop   Contact information:     Renad Jenniges A. Amedeo Kinsman, MD [facility] [address] [place],  [place]  [address] Phone: [phone] Fax: [phone]

## 2020-10-07 NOTE — Anesthesia Postprocedure Evaluation (Signed)
Anesthesia Post Note  Patient: Shari Branch  Procedure(s) Performed: CANNULATED HIP PINNING (Right: Hip)  Patient location during evaluation: PACU Anesthesia Type: Combined General/Spinal Level of consciousness: awake and alert and oriented Pain management: pain level controlled Vital Signs Assessment: post-procedure vital signs reviewed and stable Respiratory status: spontaneous breathing, respiratory function stable and patient connected to nasal cannula oxygen Cardiovascular status: stable and blood pressure returned to baseline Postop Assessment: no apparent nausea or vomiting Anesthetic complications: no   No notable events documented.   Last Vitals:  Vitals:   10/07/20 1506 10/07/20 1510  BP:  107/67  Pulse:  88  Resp:  10  Temp:    SpO2: 94% 91%    Last Pain:  Vitals:   10/07/20 1423  TempSrc:   PainSc: 0-No pain            L Sensory Level: S2-Posterior medial thigh and lower leg (10/07/20 1516) R Sensory Level: S2-Posterior medial thigh and lower leg (10/07/20 1516)  Cassanda Walmer C Sirr Kabel

## 2020-10-07 NOTE — Transfer of Care (Signed)
Immediate Anesthesia Transfer of Care Note  Patient: Shari Branch  Procedure(s) Performed: CANNULATED HIP PINNING (Right: Hip)  Patient Location: PACU  Anesthesia Type:Spinal  Level of Consciousness: awake  Airway & Oxygen Therapy: Patient Spontanous Breathing  Post-op Assessment: Report given to RN  Post vital signs: Reviewed and stable  Last Vitals:  Vitals Value Taken Time  BP    Temp    Pulse 90 10/07/20 1427  Resp 14 10/07/20 1427  SpO2 95 % 10/07/20 1427  Vitals shown include unvalidated device data.  Last Pain:  Vitals:   10/07/20 1110  TempSrc: Oral  PainSc: 3       Patients Stated Pain Goal: 6 ([id number])  Complications: No notable events documented.

## 2020-10-07 NOTE — Anesthesia Procedure Notes (Signed)
Spinal  Patient location during procedure: OR Start time: 10/07/2020 1:01 PM Reason for block: surgical anesthesia Preanesthetic Checklist Completed: patient identified, IV checked, site marked, risks and benefits discussed, surgical consent, monitors and equipment checked, pre-op evaluation and timeout performed Spinal Block Patient position: right lateral decubitus Prep: Betadine Patient monitoring: heart rate, cardiac monitor, continuous pulse ox and blood pressure Approach: right paramedian Location: L3-4 Injection technique: single-shot Needle Needle type: Spinocan  Needle gauge: 22 G Needle length: 9 cm Assessment Sensory level: T8 Events: CSF return Additional Notes ATTEMPTS:1 TRAY [id number] TRAY EXPIRATION DATE:08/04/21

## 2020-10-07 NOTE — Op Note (Signed)
Orthopaedic Surgery Operative Note (CSN: [id number])  Shari Branch  [birth year] Date of Surgery: 10/07/2020   Diagnoses:  Nondisplaced right femoral neck fracture  Procedure: CRPP of the Right femoral neck fracture   Operative Finding Successful completion of the planned procedure.  Placement of 3 x 7.0 mm cannulated screws with washers to stabilize the Non displaced femoral neck fracture   Post-Op Diagnosis: Same Surgeons:Primary: Mordecai Rasmussen, MD Assistants: None Location: [facility] OR ROOM 3 Anesthesia: Sedation plus regional anesthesia Antibiotics: Ancef 2 g Tourniquet time: N/A Estimated Blood Loss: 25 cc Complications: None Specimens: None Implants: Implant Name Type Inv. Item Serial No. Manufacturer Lot No. LRB No. Used Action  SCREW CANN 7X80 - [id number] Screw SCREW CANN 7X80  [employer] STERILE ON SET Right 3 Implanted  WASHER FLAT 7.0 - [id number] Washer WASHER FLAT 7.0  [employer] STERILE ON SET Right 3 Implanted    Indications for Surgery:   Shari Branch is a 75 y.o. female who sustained a fall from a stool, landing directly on to her right hip.  She sustained a nondisplaced right femoral neck fracture.  I recommended surgery to restore form and function.  Benefits and risks of operative and nonoperative management were discussed prior to surgery with the patient and informed consent form was completed.  Specific risks including infection, need for additional surgery, bleeding, persistent pain, malunion, nonunion, avascular necrosis and more severe complications associated with anesthesia.  She elected to proceed with surgery.    Procedure:   The patient was identified properly. Informed consent was obtained and the surgical site was marked. The patient was taken to the OR where general anesthesia was induced.  The patient was positioned supine on a fracture table.  The right leg was prepped and draped in the usual sterile fashion.  Timeout was performed before the  beginning of the case.  She received 2 g of ancef and 1 g TXA prior to making incision.   We started by making a lateral incision over the hip, in line with the starting point for the screws.  We incised sharply through the IT band to expose the lateral border of the femur.  Under fluoroscopic guidance, we placed the first guide wire along the inferior aspect of the femoral neck.  We ensured that the starting point was superior to the lesser trochanter so as to reduce the risk of a stress riser.  Orthogonal images confirmed the placement of the first guidewire to be inferior and posterior within the femoral neck.  We then placed the targeting device and placed 2 additional guidewires in the superior and anterior and then superior and posterior femoral neck. We used fluoroscopy, and we were satisfied with the positioning of the guidewires.  Using the measuring device, we determined the length of screw needed.  We then proceeded to drill the lateral cortex.  All 3 screws with a washer were then inserted under fluoroscopic guidance.  Orthogonal imaging confirmed the location of the screws of sufficient length and the washers were flush with the lateral cortex.   We irrigated the wound copiously and then closed the incision in a multilayer fashion with absorbable suture.  Sterile dressing was placed.  Patient was awoken taken to PACU in stable condition.   Post-operative plan:  The patient will be returned to the floor.   Weightbearing status:  WBAT RLE Dressing to remain in place until POD#2/3 DVT prophylaxis per primary team, no orthopedic contraindications.    Pain control with PRN  pain medication preferring oral medicines.   Follow up plan will be scheduled in approximately 10-14 days days for incision check and XR of the Right hip.

## 2020-10-08 ENCOUNTER — Inpatient Hospital Stay (HOSPITAL_COMMUNITY): Payer: Medicare Other

## 2020-10-08 DIAGNOSIS — D649 Anemia, unspecified: Secondary | ICD-10-CM

## 2020-10-08 DIAGNOSIS — E538 Deficiency of other specified B group vitamins: Secondary | ICD-10-CM

## 2020-10-08 DIAGNOSIS — D61818 Other pancytopenia: Secondary | ICD-10-CM | POA: Diagnosis not present

## 2020-10-08 DIAGNOSIS — S72001A Fracture of unspecified part of neck of right femur, initial encounter for closed fracture: Secondary | ICD-10-CM | POA: Diagnosis not present

## 2020-10-08 LAB — CBC
HCT: 30.3 % — ABNORMAL LOW (ref 36.0–46.0)
Hemoglobin: 9.9 g/dL — ABNORMAL LOW (ref 12.0–15.0)
MCH: 31.5 pg (ref 26.0–34.0)
MCHC: 32.7 g/dL (ref 30.0–36.0)
MCV: 96.5 fL (ref 80.0–100.0)
Platelets: 125 10*3/uL — ABNORMAL LOW (ref 150–400)
RBC: 3.14 MIL/uL — ABNORMAL LOW (ref 3.87–5.11)
RDW: 14.1 % (ref 11.5–15.5)
WBC: 4.3 10*3/uL (ref 4.0–10.5)
nRBC: 0 % (ref 0.0–0.2)

## 2020-10-08 LAB — BASIC METABOLIC PANEL
Anion gap: 6 (ref 5–15)
BUN: 13 mg/dL (ref 8–23)
CO2: 29 mmol/L (ref 22–32)
Calcium: 8.4 mg/dL — ABNORMAL LOW (ref 8.9–10.3)
Chloride: 97 mmol/L — ABNORMAL LOW (ref 98–111)
Creatinine, Ser: 0.55 mg/dL (ref 0.44–1.00)
GFR, Estimated: >60 mL/min (ref >60)
Glucose, Bld: 136 mg/dL — ABNORMAL HIGH (ref 70–99)
Potassium: 4 mmol/L (ref 3.5–5.1)
Sodium: 132 mmol/L — ABNORMAL LOW (ref 135–145)

## 2020-10-08 LAB — RETICULOCYTES
Immature Retic Fract: 25.2 % — ABNORMAL HIGH (ref 2.3–15.9)
RBC.: 3.08 MIL/uL — ABNORMAL LOW (ref 3.87–5.11)
Retic Count, Absolute: 83.8 10*3/uL (ref 19.0–186.0)
Retic Ct Pct: 2.7 % (ref 0.4–3.1)

## 2020-10-08 LAB — IRON AND TIBC
Iron: 36 ug/dL (ref 28–170)
Saturation Ratios: 15 % (ref 10.4–31.8)
TIBC: 240 ug/dL — ABNORMAL LOW (ref 250–450)
UIBC: 204 ug/dL

## 2020-10-08 LAB — HEMOGLOBIN A1C
Hgb A1c MFr Bld: 4.7 % — ABNORMAL LOW (ref 4.8–5.6)
Mean Plasma Glucose: 88.19 mg/dL

## 2020-10-08 LAB — FERRITIN: Ferritin: 180 ng/mL (ref 11–307)

## 2020-10-08 LAB — VITAMIN B12: Vitamin B-12: 102 pg/mL — ABNORMAL LOW (ref 180–914)

## 2020-10-08 LAB — FOLATE: Folate: 2.5 ng/mL — ABNORMAL LOW (ref >5.9)

## 2020-10-08 MED ORDER — CYANOCOBALAMIN 1000 MCG/ML IJ SOLN
1000.0000 ug | Freq: Every day | INTRAMUSCULAR | Status: DC
  Administered 2020-10-08 – 2020-10-12 (×5): 1000 ug via INTRAMUSCULAR
  Filled 2020-10-08 (×6): qty 1

## 2020-10-08 MED ORDER — PANTOPRAZOLE SODIUM 40 MG PO TBEC
40.0000 mg | DELAYED_RELEASE_TABLET | Freq: Every day | ORAL | Status: DC
  Administered 2020-10-09 – 2020-10-12 (×4): 40 mg via ORAL
  Filled 2020-10-08 (×5): qty 1

## 2020-10-08 MED ORDER — ACETAMINOPHEN 325 MG PO TABS: 650.0000 mg | ORAL_TABLET | Freq: Four times a day (QID) | ORAL | Status: DC | PRN

## 2020-10-08 MED ORDER — SODIUM CHLORIDE 0.9 % IV SOLN: INTRAVENOUS | Status: DC

## 2020-10-08 MED ORDER — FOLIC ACID 1 MG PO TABS
1.0000 mg | ORAL_TABLET | Freq: Every day | ORAL | Status: DC
  Administered 2020-10-08 – 2020-10-12 (×5): 1 mg via ORAL
  Filled 2020-10-08 (×6): qty 1

## 2020-10-08 MED ORDER — HYDROMORPHONE HCL 1 MG/ML IJ SOLN
0.5000 mg | INTRAMUSCULAR | Status: DC | PRN
  Administered 2020-10-09 – 2020-10-11 (×2): 0.5 mg via INTRAVENOUS
  Filled 2020-10-08 (×2): qty 0.5

## 2020-10-08 MED ORDER — ENOXAPARIN SODIUM 40 MG/0.4ML IJ SOSY
40.0000 mg | PREFILLED_SYRINGE | INTRAMUSCULAR | Status: DC
  Administered 2020-10-08 – 2020-10-12 (×5): 40 mg via SUBCUTANEOUS
  Filled 2020-10-08 (×5): qty 0.4

## 2020-10-08 MED ORDER — FUROSEMIDE 10 MG/ML IJ SOLN
20.0000 mg | Freq: Once | INTRAMUSCULAR | Status: AC
  Administered 2020-10-08: 20 mg via INTRAVENOUS
  Filled 2020-10-08: qty 2

## 2020-10-08 MED ORDER — HYDROCODONE-ACETAMINOPHEN 5-325 MG PO TABS
1.0000 | ORAL_TABLET | ORAL | Status: DC | PRN
  Administered 2020-10-09 – 2020-10-10 (×2): 1 via ORAL
  Filled 2020-10-08 (×2): qty 1

## 2020-10-08 NOTE — Progress Notes (Signed)
Pt's O2 removed and SaO2 checked on room air, SaO2 82%. O2 @ 2lpm [place] replaced and SaO2 up to 94%. Pt completed 7 reps on Incentive Spirometer with much encouragement.
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
## 2020-10-08 NOTE — Plan of Care (Signed)
  Problem: Acute Rehab OT Goals (only OT should resolve) Goal: Pt. Will Perform Eating Flowsheets (Taken 10/08/2020 0857) Pt Will Perform Eating:  with modified independence  sitting Goal: Pt. Will Perform Grooming Flowsheets (Taken 10/08/2020 0857) Pt Will Perform Grooming:  with supervision  standing Goal: Pt. Will Perform Lower Body Dressing Flowsheets (Taken 10/08/2020 0857) Pt Will Perform Lower Body Dressing:  with supervision  sitting/lateral leans  sit to/from stand Goal: Pt. Will Transfer To Toilet Flowsheets (Taken 10/08/2020 0857) Pt Will Transfer to Toilet:  with supervision  stand pivot transfer  ambulating  regular height toilet  bedside commode Goal: Pt. Will Perform Toileting-Clothing Manipulation Flowsheets (Taken 10/08/2020 0857) Pt Will Perform Toileting - Clothing Manipulation and hygiene:  with supervision  sitting/lateral leans  sit to/from stand Goal: Pt/Caregiver Will Perform Home Exercise Program Flowsheets (Taken 10/08/2020 0857) Pt/caregiver will Perform Home Exercise Program:  Increased strength  Both right and left upper extremity  Independently  With written HEP provided

## 2020-10-08 NOTE — Progress Notes (Signed)
1325: NT in to get vital signs. Pt awake, alert and oriented, has finished her lunch tray. Pt's SaO2 reading 76%. Pt denies SOB, resp even and nonlabored. Had pt take deep breaths, no improvement in SaO2 reading. Pt placed on 2 lpm [place] and SaO2 up to 95%.   Currently, SaO2 91-92% on 2 lpm [place]. MD notified.

## 2020-10-08 NOTE — TOC Progression Note (Signed)
Transition of Care [facility]) - Progression Note    Patient Details  Name: Shari Branch MRN: [id number] Date of Birth: 10-07-1945  [facility]) CM/SW Contact  Ihor Gully, LCSW Phone Number: 10/08/2020, 2:06 PM  Clinical Narrative:    Patient remains agreeable to SNF for rehab. Referrals sent to facilities of preference.      Barriers to Discharge: Continued Medical Work up  Expected Discharge Plan and Services                                                 Social Determinants of Health (SDOH) Interventions    Readmission Risk Interventions No flowsheet data found.

## 2020-10-08 NOTE — Evaluation (Signed)
Physical Therapy Evaluation Patient Details Name: Shari Branch MRN: [id number] DOB: [birth year] Today's Date: 10/08/2020   History of Present Illness  Shari Branch is a 75 y.o. female s/p CRPP of the Right femoral neck fracture on 10/07/20 with medical history significant for GERD, hypothyroidism, hyperlipidemia, meningioma and tobacco use who presents to the emergency department due to right hip pain, patient states that she fell out of a chair and landed on her right side, she had difficulty in being able to bear weight on the leg due to pain which was rated as 10/10 on pain scale.  She denies hitting her head or losing consciousness.  Right hip pain was aggravated with movement of right leg. EMS was activated and patient was taken to the ED for further evaluation and management.   Clinical Impression  Patient has difficulty maintaining sitting balance with frequent leaning backwards possibly due to increased right hip pain when attempt to flex trunk, very unsteady on feet and limited to a few side steps with mostly shuffling of RLE due to increased pain when weightbearing.  Patient tolerated sitting up in chair after therapy - nursing staff notified.  Patient will benefit from continued physical therapy in hospital and recommended venue below to increase strength, balance, endurance for safe ADLs and gait.      Follow Up Recommendations SNF    Equipment Recommendations  None recommended by PT    Recommendations for Other Services       Precautions / Restrictions Precautions Precautions: Fall Restrictions Weight Bearing Restrictions: Yes RLE Weight Bearing: Weight bearing as tolerated      Mobility  Bed Mobility Overal bed mobility: Needs Assistance Bed Mobility: Supine to Sit     Supine to sit: Min assist;Mod assist     General bed mobility comments: defer to OT note    Transfers Overall transfer level: Needs assistance Equipment used: Rolling walker (2  wheeled) Transfers: Sit to/from [facility] Sit to Stand: Mod assist Stand pivot transfers: Mod assist       General transfer comment: increased time, labored movement, required tactile cueing to move LLE  Ambulation/Gait Ambulation/Gait assistance: Mod assist;Max assist Gait Distance (Feet): 4 Feet Assistive device: Rolling walker (2 wheeled) Gait Pattern/deviations: Decreased step length - right;Decreased step length - left;Decreased stance time - right;Decreased stride length;Antalgic;Shuffle Gait velocity: decreased   General Gait Details: limited to 4-5 slow labored unsteady side steps requiring tactile cueing to assist with advancing LLE, mostly shuffling of RLE  Stairs            Wheelchair Mobility    Modified Rankin (Stroke Patients Only)       Balance Overall balance assessment: Needs assistance Sitting-balance support: Feet supported;No upper extremity supported Sitting balance-Leahy Scale: Poor Sitting balance - Comments: fair/poor seated at EOB, frequent leaning/falling backwards Postural control: Posterior lean Standing balance support: During functional activity;Bilateral upper extremity supported Standing balance-Leahy Scale: Poor Standing balance comment: fair/poor using RW                             Pertinent Vitals/Pain Pain Assessment: 0-10 Pain Score: 8  Pain Location: right hip Pain Descriptors / Indicators: Aching;Grimacing;Guarding;Sore Pain Intervention(s): Limited activity within patient's tolerance;Monitored during session;Patient requesting pain meds-RN notified    Home Living Family/patient expects to be discharged to:: Private residence Living Arrangements: Spouse/significant other Available Help at Discharge: Family Type of Home: Mobile home Home Access: Ramped entrance  Home Layout: One level Home Equipment: Walker - 2 wheels;Walker - 4 wheels;Cane - single point Additional Comments: 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, equipment belongs to him    Prior Function Level of Independence: Independent         Comments: Pt independent in ADLs, functional mobility, and is caregiver for her husband     Hand Dominance   Dominant Hand: Right    Extremity/Trunk Assessment   Upper Extremity Assessment Upper Extremity Assessment: Defer to OT evaluation    Lower Extremity Assessment Lower Extremity Assessment: Generalized weakness;RLE deficits/detail RLE Deficits / Details: grossly -3/5 RLE: Unable to fully assess due to pain RLE Sensation: WNL RLE Coordination: WNL    Cervical / Trunk Assessment Cervical / Trunk Assessment: Normal  Communication   Communication: No difficulties  Cognition Arousal/Alertness: Awake/alert Behavior During Therapy: WFL for tasks assessed/performed Overall Cognitive Status: Within Functional Limits for tasks assessed                                        General Comments      Exercises     Assessment/Plan    PT Assessment Patient needs continued PT services  PT Problem List Decreased strength;Decreased activity tolerance;Decreased balance;Decreased mobility       PT Treatment Interventions DME instruction;Gait training;Stair training;Functional mobility training;Therapeutic activities;Therapeutic exercise;Patient/family education;Balance training    PT Goals (Current goals can be found in the Care Plan section)  Acute Rehab PT Goals Patient Stated Goal: to get stronger and go home PT Goal Formulation: With patient Time For Goal Achievement: 10/22/20 Potential to Achieve Goals: Good    Frequency Min 4X/week   Barriers to discharge        Co-evaluation PT/OT/SLP Co-Evaluation/Treatment: Yes Reason for Co-Treatment: To address functional/ADL transfers PT goals addressed during session: Mobility/safety with mobility;Proper use of DME;Balance         AM-PAC PT "6 Clicks" Mobility  Outcome Measure Help  needed turning from your back to your side while in a flat bed without using bedrails?: A Lot Help needed moving from lying on your back to sitting on the side of a flat bed without using bedrails?: A Lot Help needed moving to and from a bed to a chair (including a wheelchair)?: A Lot Help needed standing up from a chair using your arms (e.g., wheelchair or bedside chair)?: A Lot Help needed to walk in hospital room?: A Lot Help needed climbing 3-5 steps with a railing? : Total 6 Click Score: 11    End of Session   Activity Tolerance: Patient tolerated treatment well;Patient limited by fatigue Patient left: in chair;with call bell/phone within reach Nurse Communication: Mobility status PT Visit Diagnosis: Unsteadiness on feet (R26.81);Other abnormalities of gait and mobility (R26.89);Muscle weakness (generalized) (M62.81)    Time: [id number] PT Time Calculation (min) (ACUTE ONLY): 27 min   Charges:   PT Evaluation $PT Eval Moderate Complexity: 1 Mod PT Treatments $Therapeutic Activity: 23-37 mins        10:27 AM, 10/08/20 Lonell Grandchild, MPT Physical Therapist with [facility] [phone] office [phone] mobile phone

## 2020-10-08 NOTE — Progress Notes (Signed)
Pt assisted back to bed from recliner. Pt unable to stand at all on her own, required use of gait belt and 2 full assists to stand pt and pivot back into bed. Pt refused to move right leg at all.  Foley cath removed per MD Krishnan's order. Purewick placed.

## 2020-10-08 NOTE — Progress Notes (Signed)
   ORTHOPAEDIC PROGRESS NOTE  S/p Procedure(s): CRPP of right femoral neck fracture   DOS: 10/07/20  SUBJECTIVE: No issues since surgery.  Pain is well controlled.  She has worked with physical therapy, and notes that it was difficult.  No issues otherwise.  OBJECTIVE: PE:  Alert and oriented.  No acute distress  Seated in the chair at bedside. Lateral hip dressing is clean, dry and intact.  No strikethrough is appreciated. Active motion intact in the EHL/TA Sensation intact over the dorsum of the foot.  Vitals:   10/07/20 2024 10/08/20 0449  BP: 118/76 130/74  Pulse: 76 (!) 107  Resp: 14   Temp: 98.6 F (37 C) 98.9 F (37.2 C)  SpO2: 96% 90%   Postoperative x-rays demonstrate a nondisplaced right femoral neck fracture, in good alignment.  3 screws traverse the fracture site, and are well-positioned within the femoral neck.  ASSESSMENT: Shari Branch is a 75 y.o. female stable postop PLAN: Weightbearing: NWB RLE Insicional and dressing care: Reinforce dressings as needed Orthopedic device(s): None VTE prophylaxis: At discretion of medicine team; no orthopaedic contraindications Pain control: PO medications as needed; judicious use of narcotics Follow - up plan: 2 weeks postop   Contact information:     Keron Koffman A. Amedeo Kinsman, MD [facility] [address] [place],  [place]  [address] Phone: [phone] Fax: [phone]

## 2020-10-08 NOTE — Plan of Care (Signed)
  Problem: Acute Rehab PT Goals(only PT should resolve) Goal: Pt Will Go Supine/Side To Sit Outcome: Progressing Flowsheets (Taken 10/08/2020 1028) Pt will go Supine/Side to Sit: with minimal assist Goal: Patient Will Transfer Sit To/From Stand Outcome: Progressing Flowsheets (Taken 10/08/2020 1028) Patient will transfer sit to/from stand:  with minimal assist  with moderate assist Goal: Pt Will Transfer Bed To Chair/Chair To Bed Outcome: Progressing Flowsheets (Taken 10/08/2020 1028) Pt will Transfer Bed to Chair/Chair to Bed:  with min assist  with mod assist Goal: Pt Will Ambulate Outcome: Progressing Flowsheets (Taken 10/08/2020 1028) Pt will Ambulate:  25 feet  with minimal assist  with moderate assist  with rolling walker   10:29 AM, 10/08/20 Lonell Grandchild, MPT Physical Therapist with [facility] [phone] office [phone] mobile phone

## 2020-10-08 NOTE — Evaluation (Signed)
Occupational Therapy Evaluation Patient Details Name: Shari Branch MRN: [id number] DOB: [birth year] Today's Date: 10/08/2020    History of Present Illness s/p right cannulated hip pinning on 10/07/20   Clinical Impression     Pt agreeable to OT evaluation this am, reports she has not been up since sx yesterday. Pt able to perform bed mobility with min/mod assist, increased time for fully sitting at EOB due to pain. Pt limited in ADL completion due to pain and limited dynamic sitting tolerance, unable to stand for ADLs due to pain and weakness. Recommend SNF on discharge to improve safety and independence in ADLs and functional mobility.    Follow Up Recommendations  SNF    Equipment Recommendations  None recommended by OT       Precautions / Restrictions Precautions Precautions: Fall Restrictions Weight Bearing Restrictions: Yes RLE Weight Bearing: Weight bearing as tolerated      Mobility Bed Mobility Overal bed mobility: Needs Assistance Bed Mobility: Supine to Sit     Supine to sit: Min assist;Mod assist     General bed mobility comments: increased time for transition due to pain, cuing for use of bed rail, assist for fully sitting up at EOB    Transfers                 General transfer comment: Defer to PT note        ADL either performed or assessed with clinical judgement   ADL Overall ADL's : Needs assistance/impaired Eating/Feeding: Modified independent;Sitting   Grooming: Set up;Sitting Grooming Details (indicate cue type and reason): pt unable to tolerate standing for grooming             Lower Body Dressing: Maximal assistance;Bed level Lower Body Dressing Details (indicate cue type and reason): max assist for donning socks; pt unable to bend forward to reach feet while in sitting due to pain Toilet Transfer: Maximal assistance;Stand-pivot;RW Toilet Transfer Details (indicate cue type and reason): simulated with bed to chair transfer            General ADL Comments: Pt requiring increased assistance for ADLs due to pain and limited mobility s/p sx     Vision Baseline Vision/History: Wears glasses Wears Glasses: Reading only Patient Visual Report: No change from baseline Vision Assessment?: No apparent visual deficits            Pertinent Vitals/Pain Pain Assessment: 0-10 Pain Score: 8  Pain Location: right hip Pain Descriptors / Indicators: Aching;Grimacing;Guarding;Sore Pain Intervention(s): Limited activity within patient's tolerance;Monitored during session;Repositioned;Patient requesting pain meds-RN notified     Hand Dominance Right   Extremity/Trunk Assessment Upper Extremity Assessment Upper Extremity Assessment: Overall WFL for tasks assessed   Lower Extremity Assessment Lower Extremity Assessment: Defer to PT evaluation   Cervical / Trunk Assessment Cervical / Trunk Assessment: Normal   Communication Communication Communication: No difficulties   Cognition Arousal/Alertness: Awake/alert Behavior During Therapy: WFL for tasks assessed/performed Overall Cognitive Status: Within Functional Limits for tasks assessed                                                Home Living Family/patient expects to be discharged to:: Private residence Living Arrangements: Spouse/significant other Available Help at Discharge: Family Type of Home: Mobile home Home Access: Ramped entrance     Home Layout: One level     Bathroom  Shower/Tub: Occupational psychologist: Standard     Home Equipment: Environmental consultant - 2 wheels;Walker - 4 wheels;Cane - single point   Additional Comments: 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, equipment belongs to him      Prior Functioning/Environment Level of Independence: Independent        Comments: Pt independent in ADLs, functional mobility, and is caregiver for her husband        OT Problem List: Decreased activity tolerance;Impaired balance (sitting  and/or standing);Decreased safety awareness;Decreased knowledge of use of DME or AE;Pain      OT Treatment/Interventions: Self-care/ADL training;Therapeutic exercise;DME and/or AE instruction;Therapeutic activities;Patient/family education    OT Goals(Current goals can be found in the care plan section) Acute Rehab OT Goals Patient Stated Goal: to get stronger and go home OT Goal Formulation: With patient Time For Goal Achievement: 10/22/20 Potential to Achieve Goals: Good  OT Frequency: Min 1X/week   Barriers to D/C: Decreased caregiver support                End of Session Equipment Utilized During Treatment: Rolling walker  Activity Tolerance: Patient tolerated treatment well Patient left: in chair;with call bell/phone within reach  OT Visit Diagnosis: Unsteadiness on feet (R26.81);Muscle weakness (generalized) (M62.81);Pain Pain - Right/Left: Right Pain - part of body: Hip                Time: [id number] OT Time Calculation (min): 34 min Charges:  OT General Charges $OT Visit: 1 Visit OT Evaluation $OT Eval Low Complexity: [place], OTR/L  [phone] 10/08/2020, 8:55 AM

## 2020-10-09 ENCOUNTER — Encounter (HOSPITAL_COMMUNITY): Payer: Self-pay | Admitting: Orthopedic Surgery

## 2020-10-09 DIAGNOSIS — D649 Anemia, unspecified: Secondary | ICD-10-CM | POA: Diagnosis not present

## 2020-10-09 DIAGNOSIS — S72001A Fracture of unspecified part of neck of right femur, initial encounter for closed fracture: Secondary | ICD-10-CM | POA: Diagnosis not present

## 2020-10-09 DIAGNOSIS — E538 Deficiency of other specified B group vitamins: Secondary | ICD-10-CM | POA: Diagnosis not present

## 2020-10-09 DIAGNOSIS — K219 Gastro-esophageal reflux disease without esophagitis: Secondary | ICD-10-CM | POA: Diagnosis not present

## 2020-10-09 LAB — BASIC METABOLIC PANEL
Anion gap: 10 (ref 5–15)
BUN: 9 mg/dL (ref 8–23)
CO2: 29 mmol/L (ref 22–32)
Calcium: 8.7 mg/dL — ABNORMAL LOW (ref 8.9–10.3)
Chloride: 90 mmol/L — ABNORMAL LOW (ref 98–111)
Creatinine, Ser: 0.42 mg/dL — ABNORMAL LOW (ref 0.44–1.00)
GFR, Estimated: >60 mL/min (ref >60)
Glucose, Bld: 142 mg/dL — ABNORMAL HIGH (ref 70–99)
Potassium: 3.6 mmol/L (ref 3.5–5.1)
Sodium: 129 mmol/L — ABNORMAL LOW (ref 135–145)

## 2020-10-09 LAB — CBC
HCT: 33.3 % — ABNORMAL LOW (ref 36.0–46.0)
Hemoglobin: 11.1 g/dL — ABNORMAL LOW (ref 12.0–15.0)
MCH: 31.7 pg (ref 26.0–34.0)
MCHC: 33.3 g/dL (ref 30.0–36.0)
MCV: 95.1 fL (ref 80.0–100.0)
Platelets: 126 10*3/uL — ABNORMAL LOW (ref 150–400)
RBC: 3.5 MIL/uL — ABNORMAL LOW (ref 3.87–5.11)
RDW: 14 % (ref 11.5–15.5)
WBC: 5.4 10*3/uL (ref 4.0–10.5)
nRBC: 0 % (ref 0.0–0.2)

## 2020-10-09 LAB — MAGNESIUM: Magnesium: 1.8 mg/dL (ref 1.7–2.4)

## 2020-10-09 MED ORDER — METOPROLOL TARTRATE 25 MG PO TABS
25.0000 mg | ORAL_TABLET | Freq: Two times a day (BID) | ORAL | Status: DC
  Administered 2020-10-09 – 2020-10-12 (×8): 25 mg via ORAL
  Filled 2020-10-09 (×9): qty 1

## 2020-10-09 MED ORDER — METOPROLOL TARTRATE 5 MG/5ML IV SOLN
5.0000 mg | Freq: Once | INTRAVENOUS | Status: AC
  Administered 2020-10-09: 5 mg via INTRAVENOUS
  Filled 2020-10-09: qty 5

## 2020-10-09 NOTE — TOC Progression Note (Signed)
Transition of Care [facility]) - Progression Note    Patient Details  Name: Shari Branch MRN: [id number] Date of Birth: [birth year]  Transition of Care [facility]) CM/SW [place], [place] Phone Number: 10/09/2020, 1:53 PM  Clinical Narrative:    Pt and husband accept bed at [facility]. CSW confirmed [facility] will accept pt. CSW to confirm this afternoon when bed will be ready for pt. TOC to follow.    Barriers to Discharge: Continued Medical Work up  Expected Discharge Plan and Services                                                 Social Determinants of Health (SDOH) Interventions    Readmission Risk Interventions No flowsheet data found.

## 2020-10-09 NOTE — Care Management Important Message (Signed)
Important Message  Patient Details  Name: Shari Branch MRN: [id number] Date of Birth: 1945-11-02   Medicare Important Message Given:  Yes     Tommy Medal 10/09/2020, 12:22 PM
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
## 2020-10-09 NOTE — Progress Notes (Signed)
[facility] night shift MedSurg coverage note.  The nursing staff communicated to [place] that the patient's most recent heart rate was 117 bpm.  Her BP was 161/96 and has been progressively elevating.  Pain is 6 out of 10 only when she moves.  Metoprolol 5 mg IVP x1 ordered.  Tennis Must, MD.

## 2020-10-10 DIAGNOSIS — S72001A Fracture of unspecified part of neck of right femur, initial encounter for closed fracture: Secondary | ICD-10-CM | POA: Diagnosis not present

## 2020-10-10 LAB — BASIC METABOLIC PANEL
Anion gap: 7 (ref 5–15)
BUN: 11 mg/dL (ref 8–23)
CO2: 31 mmol/L (ref 22–32)
Calcium: 9 mg/dL (ref 8.9–10.3)
Chloride: 91 mmol/L — ABNORMAL LOW (ref 98–111)
Creatinine, Ser: 0.39 mg/dL — ABNORMAL LOW (ref 0.44–1.00)
GFR, Estimated: >60 mL/min (ref >60)
Glucose, Bld: 109 mg/dL — ABNORMAL HIGH (ref 70–99)
Potassium: 3.8 mmol/L (ref 3.5–5.1)
Sodium: 129 mmol/L — ABNORMAL LOW (ref 135–145)

## 2020-10-10 LAB — CBC
HCT: 34.2 % — ABNORMAL LOW (ref 36.0–46.0)
Hemoglobin: 11.4 g/dL — ABNORMAL LOW (ref 12.0–15.0)
MCH: 31.2 pg (ref 26.0–34.0)
MCHC: 33.3 g/dL (ref 30.0–36.0)
MCV: 93.7 fL (ref 80.0–100.0)
Platelets: 153 10*3/uL (ref 150–400)
RBC: 3.65 MIL/uL — ABNORMAL LOW (ref 3.87–5.11)
RDW: 13.5 % (ref 11.5–15.5)
WBC: 5.8 10*3/uL (ref 4.0–10.5)
nRBC: 0 % (ref 0.0–0.2)

## 2020-10-10 MED ORDER — MAGNESIUM OXIDE -MG SUPPLEMENT 400 (240 MG) MG PO TABS: 400.0000 mg | ORAL_TABLET | Freq: Two times a day (BID) | ORAL | Status: DC

## 2020-10-10 MED ORDER — ENOXAPARIN SODIUM 40 MG/0.4ML IJ SOSY: 40.0000 mg | PREFILLED_SYRINGE | INTRAMUSCULAR | Status: DC

## 2020-10-10 MED ORDER — FOLIC ACID 1 MG PO TABS: 1.0000 mg | ORAL_TABLET | Freq: Every day | ORAL | Status: DC

## 2020-10-10 MED ORDER — VITAMIN B-12 1000 MCG PO TABS
1000.0000 ug | ORAL_TABLET | Freq: Every day | ORAL | Status: DC
Start: 2020-10-10 — End: 2021-03-18

## 2020-10-10 MED ORDER — METOPROLOL TARTRATE 25 MG PO TABS: 25.0000 mg | ORAL_TABLET | Freq: Two times a day (BID) | ORAL | Status: DC

## 2020-10-10 MED ORDER — HYDROCODONE-ACETAMINOPHEN 5-325 MG PO TABS: 1.0000 | ORAL_TABLET | Freq: Four times a day (QID) | ORAL | 0 refills | Status: DC | PRN

## 2020-10-10 NOTE — Progress Notes (Signed)
Received report from [place], Therapist, sports. Assumed pt care at 0700. Pt resting comfortably in bed in no acute distress. A&O. Denies pain. VSS. Tolerating diet. Call bell within reach. Will continue to monitor.
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
## 2020-10-10 NOTE — Discharge Instructions (Signed)
NWB right lower extremity per Dr. Amedeo Kinsman

## 2020-10-10 NOTE — Plan of Care (Signed)
  Problem: Activity: Goal: Risk for activity intolerance will decrease Outcome: Progressing   Problem: Coping: Goal: Level of anxiety will decrease Outcome: Progressing   Problem: Safety: Goal: Ability to remain free from injury will improve Outcome: Progressing   

## 2020-10-10 NOTE — TOC Progression Note (Signed)
Transition of Care [facility]) - Progression Note    Patient Details  Name: Shari Branch MRN: [id number] Date of Birth: 06-Jul-1945  Transition of Care [facility]) CM/SW Contact  Natasha Bence, LCSW Phone Number: 10/10/2020, 3:49 PM  Clinical Narrative:    CSW followed up with patient to inquire if they are able to take patient. Debbie with with [facility] reported that they will be able to take patient on 10/10/20. TOC to follow.     Barriers to Discharge: Continued Medical Work up  Expected Discharge Plan and Services           Expected Discharge Date: 10/10/20                                     Social Determinants of Health (SDOH) Interventions    Readmission Risk Interventions No flowsheet data found.

## 2020-10-10 NOTE — TOC Progression Note (Signed)
Transition of Care [facility]) - Progression Note    Patient Details  Name: Shari Branch MRN: [id number] Date of Birth: 30-Sep-1945  Transition of Care [facility]) CM/SW Contact  Natasha Bence, LCSW Phone Number: 10/10/2020, 12:39 PM  Clinical Narrative:    CSW contacted Debbie with [facility]. Debbie reported that they are not able to take the patient today, but will be able to take patient tomorrow. TOC signing off.      Barriers to Discharge: Continued Medical Work up  Expected Discharge Plan and Services           Expected Discharge Date: 10/10/20                                     Social Determinants of Health (SDOH) Interventions    Readmission Risk Interventions No flowsheet data found.

## 2020-10-11 MED ORDER — SENNOSIDES-DOCUSATE SODIUM 8.6-50 MG PO TABS: 2.0000 | ORAL_TABLET | Freq: Two times a day (BID) | ORAL | Status: DC

## 2020-10-11 MED ORDER — POLYETHYLENE GLYCOL 3350 17 G PO PACK
17.0000 g | PACK | Freq: Every day | ORAL | Status: DC
  Administered 2020-10-11 – 2020-10-12 (×2): 17 g via ORAL
  Filled 2020-10-11 (×3): qty 1

## 2020-10-11 MED ORDER — POLYETHYLENE GLYCOL 3350 17 G PO PACK: 17.0000 g | PACK | Freq: Every day | ORAL | 0 refills | Status: DC

## 2020-10-11 MED ORDER — SENNOSIDES-DOCUSATE SODIUM 8.6-50 MG PO TABS
2.0000 | ORAL_TABLET | Freq: Two times a day (BID) | ORAL | Status: DC
  Administered 2020-10-11 – 2020-10-12 (×4): 2 via ORAL
  Filled 2020-10-11 (×5): qty 2

## 2020-10-11 NOTE — TOC Progression Note (Signed)
Transition of Care [facility]) - Progression Note    Patient Details  Name: Sumiko Lanzer MRN: [id number] Date of Birth: 1945/12/28  Transition of Care [facility]) CM/SW Contact  Natasha Bence, LCSW Phone Number: 10/11/2020, 6:06 PM  Clinical Narrative:    CSW contacted Debbie with [facility] reported that patient's room is not ready. TOC to follow.     Barriers to Discharge: Continued Medical Work up  Expected Discharge Plan and Services           Expected Discharge Date: 10/10/20                                     Social Determinants of Health (SDOH) Interventions    Readmission Risk Interventions No flowsheet data found.

## 2020-10-11 NOTE — Discharge Summary (Signed)
[facility]  Physician Discharge Summary   Patient ID: Shari Branch MRN: [id number] DOB/AGE: 12/02/1945 75 y.o.  Admit date: 10/06/2020 Discharge date:   10/11/2020   PCP: Susy Frizzle, MD  DISCHARGE DIAGNOSES:  Right hip fracture status post CRPP Hypoxia, multifactorial Longstanding history of tobacco abuse Pancytopenia Vitamin 123456 and folic acid deficiency Hyponatremia Hypothyroidism History of meningioma  RECOMMENDATIONS FOR OUTPATIENT FOLLOW UP: Please do CBC and basic metabolic panel on Tuesday Continue oxygen at 2 L/min by nasal cannula Please refer patient to pulmonology when she is ready for discharge from skilled nursing facility Please recheck her vitamin 123456 and folic acid levels in 4 weeks.    Home Health: Going to SNF Equipment/Devices: None  CODE STATUS: Full code  DISCHARGE CONDITION: fair  Diet recommendation: Low-sodium  INITIAL HISTORY: 75 y.o. female with medical history significant for GERD, hypothyroidism, hyperlipidemia, meningioma and tobacco use who presented to the emergency department due to right hip pain, patient states that she fell out of a chair and landed on her right side, she had difficulty in being able to bear weight on the leg due to pain which was rated as 10/10 on pain scale.  X-ray revealed right subcapital femoral neck fracture.  She was hospitalized for further management.     Consultants: Orthopedics   Procedures: CRPP of the Right femoral neck fracture 8/3   HOSPITAL COURSE:   Right hip fracture Patient seen by orthopedics.  Underwent CRPP of the right hip on 8/3.  Patient did well postoperatively.  Foley catheter was removed.  Skilled nursing facility is recommended for rehabilitation by physical therapy.  Pain is adequately controlled.  DVT prophylaxis with Lovenox for 10 more days.  Can be discontinued earlier if patient becomes fully ambulatory.     Hypoxia/longstanding history of tobacco abuse/possible  chronic bronchitis/COPD Patient noted to have oxygen requirements in the hospital.  She tends to desaturate into the late 80s when taken off of oxygen.  I think this is multifactorial.  Patient however is completely asymptomatic so the possibility of venous thromboembolism is very low.  She does have a heavy history of smoking.  Has a 2 pack/day history of smoking for the last 45+ years.  I think she has an element of undiagnosed COPD which is probably responsible for her hypoxia.  Chest x-ray showed atelectasis which is also contributing.  Incentive spirometry should be used.  She will need oxygen support for now.  Will not do any other testing since she is otherwise asymptomatic.  She will benefit from outpatient follow-up with pulmonology. Continue oxygen at 2 L/min for now.   Pancytopenia/postoperative acute blood loss anemia Drop in hemoglobin likely surgical.  No other overt loss noted.  Continue to monitor.  Because she was found to have pancytopenia we did anemia panel which revealed that she is deficient in both vitamin B12 and folate.  These will be supplemented.  No clear evidence for iron deficiency.   Hemoglobin stable.   Vitamin 123456 and folic acid deficiency This will be supplemented.  Levels will need to be rechecked in 4 weeks.   Hyponatremia Patient noted to have hyponatremia when at admission.  Drop in sodium to 129 most likely due to Lasix.  Stable this morning.  Recommend rechecking in the outpatient setting.  If it continues to be low would recommend that patient be referred to nephrology.     Elevated blood pressure/sinus tachycardia Continue metoprolol.  Hypomagnesemia This was repleted.    Hyperglycemia Likely  reactive.  HbA1c 4.7.  Hypothyroidism Continue levothyroxine.  Hyperlipidemia Continue Lipitor  History of GERD Continue Protonix.  History of meningioma Followed by neurosurgery in [place] Patient was counseled.   Transient  confusion noted overnight on 8/5.  Most likely multifactorial including pain issues, pain medications, being in the hospital, anesthesia.  No focal neurological deficits noted on examination.  She was questioned again later in the morning and she was more oriented than.  She is fully oriented this morning. [place] for discharge to SNF.  She is noted to be stable otherwise.  Okay for discharge to go to skilled nursing facility to start rehabilitation.   PERTINENT LABS:  The results of significant diagnostics from this hospitalization (including imaging, microbiology, ancillary and laboratory) are listed below for reference.    Microbiology: Recent Results (from the past 240 hour(s))  Resp Panel by RT-PCR (Flu A&B, Covid) Nasopharyngeal Swab     Status: None   Collection Time: 10/06/20  2:31 AM   Specimen: Nasopharyngeal Swab; Nasopharyngeal(NP) swabs in vial transport medium  Result Value Ref Range Status   SARS Coronavirus 2 by RT PCR NEGATIVE NEGATIVE Final    Comment: (NOTE) SARS-CoV-2 target nucleic acids are NOT DETECTED.  The SARS-CoV-2 RNA is generally detectable in upper respiratory specimens during the acute phase of infection. The lowest concentration of SARS-CoV-2 viral copies this assay can detect is 138 copies/mL. A negative result does not preclude SARS-Cov-2 infection and should not be used as the sole basis for treatment or other patient management decisions. A negative result may occur with  improper specimen collection/handling, submission of specimen other than nasopharyngeal swab, presence of viral mutation(s) within the areas targeted by this assay, and inadequate number of viral copies(<138 copies/mL). A negative result must be combined with clinical observations, patient history, and epidemiological information. The expected result is Negative.  Fact Sheet for Patients:  EntrepreneurPulse.com.au  Fact Sheet for Healthcare Providers:   IncredibleEmployment.be  This test is no t yet approved or cleared by the [place] FDA and  has been authorized for detection and/or diagnosis of SARS-CoV-2 by FDA under an Emergency Use Authorization (EUA). This EUA will remain  in effect (meaning this test can be used) for the duration of the COVID-19 declaration under Section 564(b)(1) of the Act, 21 U.S.C.section 360bbb-3(b)(1), unless the authorization is terminated  or revoked sooner.       Influenza A by PCR NEGATIVE NEGATIVE Final   Influenza B by PCR NEGATIVE NEGATIVE Final    Comment: (NOTE) The Xpert Xpress SARS-CoV-2/FLU/RSV plus assay is intended as an aid in the diagnosis of influenza from Nasopharyngeal swab specimens and should not be used as a sole basis for treatment. Nasal washings and aspirates are unacceptable for Xpert Xpress SARS-CoV-2/FLU/RSV testing.  Fact Sheet for Patients: EntrepreneurPulse.com.au  Fact Sheet for Healthcare Providers: IncredibleEmployment.be  This test is not yet approved or cleared by the [place] FDA and has been authorized for detection and/or diagnosis of SARS-CoV-2 by FDA under an Emergency Use Authorization (EUA). This EUA will remain in effect (meaning this test can be used) for the duration of the COVID-19 declaration under Section 564(b)(1) of the Act, 21 U.S.C. section 360bbb-3(b)(1), unless the authorization is terminated or revoked.  Performed at [facility], [address], [place], [place] [address]   Surgical pcr screen     Status: None   Collection Time: 10/07/20  3:56 AM   Specimen: Nasal Mucosa; Nasal Swab  Result Value  Ref Range Status   MRSA, PCR NEGATIVE NEGATIVE Final   Staphylococcus aureus NEGATIVE NEGATIVE Final    Comment: (NOTE) The Xpert SA Assay (FDA approved for NASAL specimens in patients 61 years of age and older), is one component of a comprehensive surveillance program. It is not  intended to diagnose infection nor to guide or monitor treatment. Performed at [facility], [address], [place], [place] [address]      Labs:  COVID-19 Labs    Lab Results  Component Value Date   [place] 10/06/2020      Basic Metabolic Panel: Recent Labs  Lab 10/06/20 0244 10/07/20 0437 10/08/20 0511 10/09/20 0555 10/10/20 0604  NA 133* 133* 132* 129* 129*  K 3.6 3.6 4.0 3.6 3.8  CL 96* 95* 97* 90* 91*  CO2 '29 30 29 29 31  '$ GLUCOSE 166* 127* 136* 142* 109*  BUN '10 9 13 9 11  '$ CREATININE 0.51 0.34* 0.55 0.42* 0.39*  CALCIUM 8.9 9.0 8.4* 8.7* 9.0  MG 1.4*  --   --  1.8  --   PHOS 3.6  --   --   --   --    Liver Function Tests: Recent Labs  Lab 10/07/20 0437  AST 23  ALT 18  ALKPHOS 118  BILITOT 1.6*  PROT 6.6  ALBUMIN 3.7    CBC: Recent Labs  Lab 10/06/20 0244 10/07/20 0437 10/08/20 0511 10/09/20 0555 10/10/20 0604  WBC 3.9* 3.9* 4.3 5.4 5.8  NEUTROABS 3.4  --   --   --   --   HGB 11.1* 11.6* 9.9* 11.1* 11.4*  HCT 33.6* 34.9* 30.3* 33.3* 34.2*  MCV 95.7 94.8 96.5 95.1 93.7  PLT 127* 120* 125* 126* 153      IMAGING STUDIES DG CHEST PORT 1 VIEW  Result Date: 10/08/2020 CLINICAL DATA:  Shortness of breath EXAM: PORTABLE CHEST 1 VIEW COMPARISON:  10/06/2020, CT 02/22/2011 FINDINGS: Mild diffuse bronchitic changes. Interval patchy atelectasis at the bases. Stable cardiomediastinal silhouette. No pneumothorax IMPRESSION: Bronchitic changes with interval patchy opacity at the lung bases likely atelectasis Electronically Signed   By: Donavan Foil M.D.   On: 10/08/2020 16:00   DG Chest Portable 1 View  Result Date: 10/06/2020 CLINICAL DATA:  Preop for right hip fracture EXAM: PORTABLE CHEST 1 VIEW COMPARISON:  04/03/2018 FINDINGS: The heart size and mediastinal contours are within normal limits. Both lungs are clear. The visualized skeletal structures are unremarkable. IMPRESSION: No active disease. Electronically Signed   By: Ulyses Jarred M.D.   On: 10/06/2020 02:44   DG Knee Complete 4 Views Right  Result Date: 10/06/2020 CLINICAL DATA:  Fall, right hip fracture, right leg pain EXAM: RIGHT KNEE - COMPLETE 4+ VIEW COMPARISON:  None. FINDINGS: Four view radiograph right knee demonstrates normal alignment. No fracture or dislocation. Mild tricompartmental degenerative arthritis. Small right knee effusion. Soft tissues are unremarkable. IMPRESSION: No acute fracture or dislocation.  Small right knee effusion. Electronically Signed   By: Fidela Salisbury MD   On: 10/06/2020 02:09   DG HIP OPERATIVE UNILAT WITH PELVIS RIGHT  Result Date: 10/07/2020 CLINICAL DATA:  Right hip fracture status post pinning EXAM: DG HIP (WITH OR WITHOUT PELVIS) 2-3V RIGHT; OPERATIVE RIGHT HIP WITH PELVIS COMPARISON:  10/06/2020 FINDINGS: Nine fluoroscopic images are obtained during the performance of the procedure and are provided for interpretation only. Images demonstrate placement of 3 cannulated screws traversing a subcapital right femoral neck fracture. Near anatomic alignment on the final images.  Please refer to the operative report. FLUOROSCOPY TIME:  1 minutes 21 seconds IMPRESSION: 1. Pinning of a subcapital right hip fracture as above. Near anatomic alignment. Electronically Signed   By: Randa Ngo M.D.   On: 10/07/2020 15:36   DG HIP UNILAT WITH PELVIS 2-3 VIEWS RIGHT  Result Date: 10/07/2020 CLINICAL DATA:  Right hip fracture status post pinning EXAM: DG HIP (WITH OR WITHOUT PELVIS) 2-3V RIGHT; OPERATIVE RIGHT HIP WITH PELVIS COMPARISON:  10/06/2020 FINDINGS: Nine fluoroscopic images are obtained during the performance of the procedure and are provided for interpretation only. Images demonstrate placement of 3 cannulated screws traversing a subcapital right femoral neck fracture. Near anatomic alignment on the final images. Please refer to the operative report. FLUOROSCOPY TIME:  1 minutes 21 seconds IMPRESSION: 1. Pinning of a subcapital right  hip fracture as above. Near anatomic alignment. Electronically Signed   By: Randa Ngo M.D.   On: 10/07/2020 15:36   DG Hip Unilat W or Wo Pelvis 2-3 Views Right  Result Date: 10/06/2020 CLINICAL DATA:  Fall, right hip pain EXAM: DG HIP (WITH OR WITHOUT PELVIS) 2-3V RIGHT COMPARISON:  11/05/2007 FINDINGS: There is an acute, impacted subcapital right femoral neck fracture. Femoral head is still seated within the right acetabulum. Moderate superimposed right hip degenerative arthritis. Pelvis is intact visualized left hip are intact. Moderate left hip degenerative arthritis noted. Soft tissues are unremarkable. IMPRESSION: Acute impacted right subcapital femoral neck fracture. Superimposed moderate right hip degenerative arthritis. Electronically Signed   By: Fidela Salisbury MD   On: 10/06/2020 02:07    DISCHARGE EXAMINATION: Vitals:   10/10/20 0603 10/10/20 1430 10/10/20 2237 10/11/20 0508  BP: (!) 143/87 (!) 152/94 121/79 118/75  Pulse: 90 83 95 87  Resp: '18 19 17   '$ Temp: 98.7 F (37.1 C) 98.5 F (36.9 C) 98.5 F (36.9 C) 98.6 F (37 C)  TempSrc: Oral  Oral Oral  SpO2: 95% 95% 94% 95%  Weight:      Height:         General appearance: Awake alert.  In no distress Resp: Clear to auscultation bilaterally.  Normal effort Cardio: S1-S2 is normal regular.  No S3-S4.  No rubs murmurs or bruit GI: Abdomen is soft.  Nontender nondistended.  Bowel sounds are present normal.  No masses organomegaly    DISPOSITION: SNF  Discharge Instructions     Call MD for:  difficulty breathing, headache or visual disturbances   Complete by: As directed    Call MD for:  extreme fatigue   Complete by: As directed    Call MD for:  persistant dizziness or light-headedness   Complete by: As directed    Call MD for:  persistant nausea and vomiting   Complete by: As directed    Call MD for:  severe uncontrolled pain   Complete by: As directed    Call MD for:  temperature >100.4   Complete by: As  directed    Diet - low sodium heart healthy   Complete by: As directed    Discharge instructions   Complete by: As directed    Please review instructions on the discharge summary.  You were cared for by a hospitalist during your hospital stay. If you have any questions about your discharge medications or the care you received while you were in the hospital after you are discharged, you can call the unit and asked to speak with the hospitalist on call if the hospitalist that took care of  you is not available. Once you are discharged, your primary care physician will handle any further medical issues. Please note that NO REFILLS for any discharge medications will be authorized once you are discharged, as it is imperative that you return to your primary care physician (or establish a relationship with a primary care physician if you do not have one) for your aftercare needs so that they can reassess your need for medications and monitor your lab values. If you do not have a primary care physician, you can call [phone] for a physician referral.   Increase activity slowly   Complete by: As directed    No wound care   Complete by: As directed          Allergies as of 10/11/2020       Reactions   Sulfonamide Derivatives Other (See Comments)   REACTION: Joint Pain and locking - walks like a 75 year old        Medication List     STOP taking these medications    alendronate 70 MG tablet Commonly known as: FOSAMAX       TAKE these medications    amitriptyline 50 MG tablet Commonly known as: ELAVIL TAKE 3 TABLETS BY MOUTH EVERY DAY AT BEDTIME What changed: See the new instructions.   atorvastatin 20 MG tablet Commonly known as: LIPITOR Take 1 tablet (20 mg total) by mouth daily.   enoxaparin 40 MG/0.4ML injection Commonly known as: LOVENOX Inject 0.4 mLs (40 mg total) into the skin daily for 10 days.   escitalopram 20 MG tablet Commonly known as: LEXAPRO Take 1 tablet by  mouth once daily   Euthyrox 112 MCG tablet Generic drug: levothyroxine Take 1 tablet by mouth once daily   folic acid 1 MG tablet Commonly known as: FOLVITE Take 1 tablet (1 mg total) by mouth daily.   furosemide 20 MG tablet Commonly known as: LASIX Take 1 tablet (20 mg total) by mouth daily as needed (leg swelling).   HYDROcodone-acetaminophen 5-325 MG tablet Commonly known as: NORCO/VICODIN Take 1 tablet by mouth every 6 (six) hours as needed for moderate pain.   magnesium oxide 400 (240 Mg) MG tablet Commonly known as: MAG-OX Take 1 tablet (400 mg total) by mouth 2 (two) times daily.   metoprolol tartrate 25 MG tablet Commonly known as: LOPRESSOR Take 1 tablet (25 mg total) by mouth 2 (two) times daily.   naproxen 500 MG tablet Commonly known as: NAPROSYN Take 1 tablet (500 mg total) by mouth 2 (two) times daily with a meal.   pantoprazole 40 MG tablet Commonly known as: PROTONIX Take 1 tablet by mouth twice daily   polyethylene glycol 17 g packet Commonly known as: MIRALAX / GLYCOLAX Take 17 g by mouth daily.   senna-docusate 8.6-50 MG tablet Commonly known as: Senokot-S Take 2 tablets by mouth 2 (two) times daily.   vitamin B-12 1000 MCG tablet Commonly known as: CYANOCOBALAMIN Take 1 tablet (1,000 mcg total) by mouth daily.          Contact information for follow-up providers     Mordecai Rasmussen, MD. Schedule an appointment as soon as possible for a visit in 2 week(s).   Specialties: Orthopedic Surgery, Sports Medicine Why: post surgical f/u Contact information: [address] [address] [phone]              Contact information for after-discharge care     [facility]  Preferred [facility] .   Service: Skilled Nursing Contact information: [address] [place] [phone]                     TOTAL DISCHARGE TIME: 55 minutes  [place]  [facility] Pager on [email or web address]  10/11/2020, 9:56 AM

## 2020-10-12 MED ORDER — AMITRIPTYLINE HCL 25 MG PO TABS
150.0000 mg | ORAL_TABLET | Freq: Every day | ORAL | Status: DC
  Administered 2020-10-12: 100 mg via ORAL
  Filled 2020-10-12: qty 6

## 2020-10-12 NOTE — Progress Notes (Signed)
Pt assisted up OOB into recliner. Pt states has not been OOB since Thursday 10/08/2020. Pt weak and required assistance to sit on side of bed, but once sitting up, able to hold to FWW and with assist x2 pt stood up and able to bear weight well. Required cueing for right foot placement and sequencing for using arms/upper body to assist in weight bearing on right leg. Pt able to take 3 steps and pivot to recliner with only standby assist, tolerated well, denies any c/o.
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
## 2020-10-12 NOTE — Progress Notes (Signed)
Pt updated on transfer status to [facility]. Unable to advise when (or if) pt will transfer tonight. Pt stated understanding. Attempted to call pt's husband for update but no answer.

## 2020-10-12 NOTE — Care Management Important Message (Signed)
Important Message  Patient Details  Name: Shari Branch MRN: [id number] Date of Birth: February 02, 1946   Medicare Important Message Given:  Yes     Tommy Medal 10/12/2020, 12:40 PM

## 2020-10-12 NOTE — TOC Transition Note (Signed)
Transition of Care [facility]) - CM/SW Discharge Note   Patient Details  Name: Shari Branch MRN: [id number] Date of Birth: Jul 14, 1945  Transition of Care [facility]) CM/SW Contact:  Iona Beard, [place] Phone Number: 10/12/2020, 12:53 PM   Clinical Narrative:    CSW confirmed with [facility] that pt can transfer to their facility today. Attending and RN aware. RN to call report. CSW scheduled EMS for transport. CSW spoke with pts husband to update on transfer and on visitation policy at [facility]. TOC signing off.   Final next level of care: Skilled Nursing Facility Barriers to Discharge: Continued Medical Work up   Patient Goals and CMS Choice Patient states their goals for this hospitalization and ongoing recovery are:: Go to rehab CMS Medicare.gov Compare Post Acute Care list provided to:: Patient Choice offered to / list presented to : Patient  Discharge Placement              Patient chooses bed at: Other - please specify in the comment section below: ([facility]) Patient to be transferred to facility by: EMS Name of family member notified: Jessy Oto (Spouse)   [phone] Patient and family notified of of transfer: 10/12/20  Discharge Plan and Services                                     Social Determinants of Health (SDOH) Interventions     Readmission Risk Interventions No flowsheet data found.

## 2020-10-13 DIAGNOSIS — R41841 Cognitive communication deficit: Secondary | ICD-10-CM | POA: Diagnosis present

## 2020-10-13 DIAGNOSIS — D518 Other vitamin B12 deficiency anemias: Secondary | ICD-10-CM | POA: Diagnosis present

## 2020-10-13 DIAGNOSIS — R531 Weakness: Secondary | ICD-10-CM | POA: Diagnosis not present

## 2020-10-13 DIAGNOSIS — D320 Benign neoplasm of cerebral meninges: Secondary | ICD-10-CM | POA: Diagnosis present

## 2020-10-13 DIAGNOSIS — G500 Trigeminal neuralgia: Secondary | ICD-10-CM | POA: Diagnosis present

## 2020-10-13 DIAGNOSIS — D61818 Other pancytopenia: Secondary | ICD-10-CM | POA: Diagnosis present

## 2020-10-13 DIAGNOSIS — K219 Gastro-esophageal reflux disease without esophagitis: Secondary | ICD-10-CM | POA: Diagnosis present

## 2020-10-13 DIAGNOSIS — F339 Major depressive disorder, recurrent, unspecified: Secondary | ICD-10-CM | POA: Diagnosis present

## 2020-10-13 DIAGNOSIS — R2689 Other abnormalities of gait and mobility: Secondary | ICD-10-CM | POA: Diagnosis present

## 2020-10-13 DIAGNOSIS — Z720 Tobacco use: Secondary | ICD-10-CM | POA: Diagnosis not present

## 2020-10-13 DIAGNOSIS — D529 Folate deficiency anemia, unspecified: Secondary | ICD-10-CM | POA: Diagnosis present

## 2020-10-13 DIAGNOSIS — R0902 Hypoxemia: Secondary | ICD-10-CM | POA: Diagnosis present

## 2020-10-13 DIAGNOSIS — E039 Hypothyroidism, unspecified: Secondary | ICD-10-CM | POA: Diagnosis present

## 2020-10-13 DIAGNOSIS — M6281 Muscle weakness (generalized): Secondary | ICD-10-CM | POA: Diagnosis present

## 2020-10-13 DIAGNOSIS — S72001A Fracture of unspecified part of neck of right femur, initial encounter for closed fracture: Secondary | ICD-10-CM | POA: Diagnosis not present

## 2020-10-13 DIAGNOSIS — S72091D Other fracture of head and neck of right femur, subsequent encounter for closed fracture with routine healing: Secondary | ICD-10-CM | POA: Diagnosis not present

## 2020-10-13 DIAGNOSIS — Z743 Need for continuous supervision: Secondary | ICD-10-CM | POA: Diagnosis not present

## 2020-10-13 DIAGNOSIS — R279 Unspecified lack of coordination: Secondary | ICD-10-CM | POA: Diagnosis present

## 2020-10-13 DIAGNOSIS — S728X1D Other fracture of right femur, subsequent encounter for closed fracture with routine healing: Secondary | ICD-10-CM | POA: Diagnosis not present

## 2020-10-13 DIAGNOSIS — S72011D Unspecified intracapsular fracture of right femur, subsequent encounter for closed fracture with routine healing: Secondary | ICD-10-CM | POA: Diagnosis not present

## 2020-10-13 DIAGNOSIS — R52 Pain, unspecified: Secondary | ICD-10-CM | POA: Diagnosis not present

## 2020-10-13 DIAGNOSIS — Z79899 Other long term (current) drug therapy: Secondary | ICD-10-CM | POA: Diagnosis not present

## 2020-10-13 NOTE — Progress Notes (Signed)
Nsg Discharge Note  Admit Date:  10/06/2020 Discharge date: 10/13/2020   Shari Branch to be D/C'd Nursing Home per MD order.  AVS completed.  Copy for chart, and copy for patient signed, and dated. Patient/caregiver able to verbalize understanding.  Discharge Medication:   Discharge Assessment: Vitals:   10/13/20 0139 10/13/20 0519  BP: 108/63 116/66  Pulse: 75 81  Resp:  20  Temp: (!) 97.4 F (36.3 C) 98.3 F (36.8 C)  SpO2: 97% 98%   Skin clean, dry and intact without evidence of skin break down, no evidence of skin tears noted. IV catheter discontinued intact. Site without signs and symptoms of complications - no redness or edema noted at insertion site, patient denies c/o pain - only slight tenderness at site.  Dressing with slight pressure applied.  D/c Instructions-Education: Discharge instructions given to patient/family with verbalized understanding. D/c education completed with patient/family including follow up instructions, medication list, d/c activities limitations if indicated, with other d/c instructions as indicated by MD - patient able to verbalize understanding, all questions fully answered. Patient instructed to return to ED, call 911, or call MD for any changes in condition.  Patient escorted via [place], and D/C home via private auto.  Clovis Fredrickson, LPN [id number] AM

## 2020-10-13 NOTE — Progress Notes (Signed)
Called [facility] to see if they were ready to receive patient.  [facility] stated that if patient was a new admission, they would be unable to receive her at this time, due to not being able to have access to pharmacy, not having any paperwork, and not being able to take new admissions at this time.

## 2020-10-19 DIAGNOSIS — S72001A Fracture of unspecified part of neck of right femur, initial encounter for closed fracture: Secondary | ICD-10-CM | POA: Diagnosis not present

## 2020-10-19 DIAGNOSIS — F339 Major depressive disorder, recurrent, unspecified: Secondary | ICD-10-CM | POA: Diagnosis not present

## 2020-10-19 DIAGNOSIS — Z79899 Other long term (current) drug therapy: Secondary | ICD-10-CM | POA: Diagnosis not present

## 2020-10-19 DIAGNOSIS — S728X1D Other fracture of right femur, subsequent encounter for closed fracture with routine healing: Secondary | ICD-10-CM | POA: Diagnosis not present

## 2020-10-19 DIAGNOSIS — K219 Gastro-esophageal reflux disease without esophagitis: Secondary | ICD-10-CM | POA: Diagnosis not present

## 2020-10-19 DIAGNOSIS — G500 Trigeminal neuralgia: Secondary | ICD-10-CM | POA: Diagnosis not present

## 2020-10-20 ENCOUNTER — Telehealth: Payer: Self-pay | Admitting: Orthopedic Surgery

## 2020-10-20 DIAGNOSIS — S72001A Fracture of unspecified part of neck of right femur, initial encounter for closed fracture: Secondary | ICD-10-CM | POA: Diagnosis not present

## 2020-10-20 NOTE — Telephone Encounter (Signed)
Not sure what type of closure the patient has, should I call and give verbal order for removal of suture/staples? Please advise.

## 2020-10-20 NOTE — Telephone Encounter (Signed)
Attempted to call [facility] to give a verbal order, was on hold for 10 mins with no answer. Will try again tomorrow.

## 2020-10-20 NOTE — Telephone Encounter (Signed)
Patient had surgery on 10/07/20.  I received a call from [facility] where the patient is staying.  They said that [name] has a post op appointment in our office on the 17th but has tested positive for Covid on the 12th.   This appointment has now been changed to 10/30/20 at 10:30  Please advise if this is okay or what we need to do  Thanks

## 2020-10-21 ENCOUNTER — Encounter: Payer: Medicare Other | Admitting: Orthopedic Surgery

## 2020-10-22 ENCOUNTER — Telehealth: Payer: Self-pay | Admitting: *Deleted

## 2020-10-22 DIAGNOSIS — S728X1D Other fracture of right femur, subsequent encounter for closed fracture with routine healing: Secondary | ICD-10-CM | POA: Diagnosis not present

## 2020-10-22 DIAGNOSIS — G500 Trigeminal neuralgia: Secondary | ICD-10-CM | POA: Diagnosis not present

## 2020-10-22 DIAGNOSIS — S72001A Fracture of unspecified part of neck of right femur, initial encounter for closed fracture: Secondary | ICD-10-CM | POA: Diagnosis not present

## 2020-10-22 DIAGNOSIS — F339 Major depressive disorder, recurrent, unspecified: Secondary | ICD-10-CM | POA: Diagnosis not present

## 2020-10-22 DIAGNOSIS — R52 Pain, unspecified: Secondary | ICD-10-CM | POA: Diagnosis not present

## 2020-10-22 NOTE — Telephone Encounter (Signed)
Received call from patient.   Patient reports that she wanted to notify PCP that she had fall that resulted in fracture to hip. States that she has had surgery and has been placed in rehab short term.

## 2020-10-30 ENCOUNTER — Ambulatory Visit (INDEPENDENT_AMBULATORY_CARE_PROVIDER_SITE_OTHER): Payer: Medicare Other | Admitting: Orthopedic Surgery

## 2020-10-30 ENCOUNTER — Other Ambulatory Visit: Payer: Self-pay

## 2020-10-30 ENCOUNTER — Ambulatory Visit: Payer: Medicare Other

## 2020-10-30 ENCOUNTER — Encounter: Payer: Self-pay | Admitting: Orthopedic Surgery

## 2020-10-30 VITALS — Ht 67.0 in | Wt 162.0 lb

## 2020-10-30 DIAGNOSIS — S72001A Fracture of unspecified part of neck of right femur, initial encounter for closed fracture: Secondary | ICD-10-CM

## 2020-10-30 NOTE — Progress Notes (Signed)
Orthopaedic Postop Note  Assessment: Shari Branch is a 75 y.o. female s/p CRPP of right nondisplaced femoral neck fracture  DOS: 10/07/2020  Plan: Reviewed radiographs in clinic with the patient today which demonstrates some interval displacement of the fracture site.  Unfortunately, she does not appear to be healing the fracture in a stable position.  There has been some varus displacement of the fracture site, and I do think that she will ultimately require a revision surgery to a cemented hemiarthroplasty.  This was discussed with the patient in clinic today.  She states she is not having much pain, and her pain is improving.  She is able to ambulate with the assistance of a walker.  She would like to allow her fracture more time to heal, and we will plan to see her back in approximately 2 weeks.  If she continues to have pain, or worsening alignment of the fracture on radiographs, we will have to make plans to revise surgery which would include removal of the existing hardware, and revision to a hemiarthroplasty.  All questions were answered and she is amenable to this plan.  She will continue with her current level of activities, working with therapy at her skilled nursing facility.  Follow-up: Return in about 2 weeks (around 11/13/2020). XR at next visit: AP pelvis, right hip  Subjective:  Chief Complaint  Patient presents with   Routine Post Op    Rt hip DOS 10/07/20    History of Present Illness: Shari Branch is a 75 y.o. female who presents following the above stated procedure.  Surgery was approximately 3 weeks ago.  She has remained in a skilled nursing facility upon discharge from the hospital.  She feels that she is improving.  Her pain is getting better.  She is able to ambulate with the assistance of a walker.  She has occasional pains in her right groin area.  She denies numbness or tingling.  No fevers or chills.  Review of Systems: No fevers or chills No numbness or  tingling No Chest Pain No shortness of breath   Objective: Ht '5\' 7"'$  (1.702 m)   Wt 162 lb (73.5 kg)   BMI 25.37 kg/m   Physical Exam:  Elderly female.  Seated in a wheelchair.  No acute distress.  Alert and oriented.  Evaluation of right hip demonstrates a well-healed surgical incision over the lateral hip.  She is able to maintain a straight leg raise.  Active motion intact in the EHL/TA.  She tolerates gentle range of motion in her right hip, with mild discomfort in the groin area.  Toes are warm and well-perfused.  IMAGING: I personally ordered and reviewed the following images:  X-rays of the right hip were obtained in clinic today and demonstrates mild displacement of the femoral neck fracture.  There has been further displacement and varus alignment overall.  There does appear to be some interval displacement of the hardware.  No hardware failure.  Screws remain in good position.  There does appear to be some mild callus formation, particularly within the inferior aspect of the femoral neck.  Impression: Tenting of right femoral neck fracture with interval displacement, currently in varus alignment.   Mordecai Rasmussen, MD 10/30/2020 1:36 PM

## 2020-11-04 ENCOUNTER — Telehealth: Payer: Self-pay

## 2020-11-04 NOTE — Telephone Encounter (Signed)
Patient called and stated she wants to have the surgery so please go ahead and schedule it. Wants a call back with information.  CB [phone]

## 2020-11-05 NOTE — Telephone Encounter (Signed)
Called pt and gave her the date 9/19 for surgery. Pt agrees to date and would like to proceed with scheduling.

## 2020-11-06 ENCOUNTER — Ambulatory Visit: Payer: Self-pay | Admitting: Orthopedic Surgery

## 2020-11-09 DIAGNOSIS — F339 Major depressive disorder, recurrent, unspecified: Secondary | ICD-10-CM | POA: Diagnosis not present

## 2020-11-09 DIAGNOSIS — G500 Trigeminal neuralgia: Secondary | ICD-10-CM | POA: Diagnosis not present

## 2020-11-09 DIAGNOSIS — K219 Gastro-esophageal reflux disease without esophagitis: Secondary | ICD-10-CM | POA: Diagnosis not present

## 2020-11-09 DIAGNOSIS — S728X1D Other fracture of right femur, subsequent encounter for closed fracture with routine healing: Secondary | ICD-10-CM | POA: Diagnosis not present

## 2020-11-09 DIAGNOSIS — S72001A Fracture of unspecified part of neck of right femur, initial encounter for closed fracture: Secondary | ICD-10-CM | POA: Diagnosis not present

## 2020-11-09 DIAGNOSIS — R52 Pain, unspecified: Secondary | ICD-10-CM | POA: Diagnosis not present

## 2020-11-11 NOTE — Patient Instructions (Addendum)
Shari Branch  11/11/2020     '@PREFPERIOPPHARMACY'$ @   Your procedure is scheduled on  11/16/2020.   Report to [name] at  [phone] A.M.   Call this number if you have problems the morning of surgery:  [phone]   Remember:  Do not eat or drink after midnight.      Take these medicines the morning of surgery with A SIP OF WATER            lexapro, euthyrox, hydrocodone (if needed), metoprolol, protonix.     Do not wear jewelry, make-up or nail polish.  Do not wear lotions, powders, or perfumes, or deodorant.  Do not shave 48 hours prior to surgery.  Men may shave face and neck.  Do not bring valuables to the hospital.  [facility] is not responsible for any belongings or valuables.  Contacts, dentures or bridgework may not be worn into surgery.  Leave your suitcase in the car.  After surgery it may be brought to your room.  For patients admitted to the hospital, discharge time will be determined by your treatment team.  Patients discharged the day of surgery will not be allowed to drive home and must have someone with them for 24 hours.    Special instructions:   DO NOT smoke tobacco or vape for 24 hours before your procedure.  Please read over the following fact sheets that you were given. Coughing and Deep Breathing, Surgical Site Infection Prevention, Anesthesia Post-op Instructions, and Care and Recovery After Surgery      Partial Hip Replacement, Anterior, Care After This sheet gives you information about how to care for yourself after your procedure. Your health care provider may also give you more specific instructions. If you have problems or questions, contact your health care provider. What can I expect after the procedure? After the procedure, it is common to have: Redness, pain, and swelling at your incision area. Stiffness. Discomfort. A small amount of blood or clear fluid coming from your incision. Follow these instructions at  home: Medicines Take over-the-counter and prescription medicines only as told by your health care provider. If you were prescribed a blood thinner (anticoagulant) to help prevent blood clots, take it as told by your health care provider. Ask your health care provider if the medicine prescribed to you: Requires you to avoid driving or using machinery. Can cause constipation. You may need to take these actions to prevent or treat constipation: Drink enough fluid to keep your urine pale yellow. Take over-the-counter or prescription medicines. Eat foods that are high in fiber, such as beans, whole grains, and fresh fruits and vegetables. Limit foods that are high in fat and processed sugars, such as fried or sweet foods. Incision care  Follow instructions from your health care provider about how to take care of your incision. Make sure you: Wash your hands with soap and water for at least 20 seconds before and after you change your bandage (dressing). If soap and water are not available, use hand sanitizer. Change your dressing as told by your health care provider. Leave stitches (sutures), staples, skin glue, or adhesive strips in place. These skin closures may need to stay in place for 2 weeks or longer. If adhesive strip edges start to loosen and curl up, you may trim the loose edges. Do not remove adhesive strips completely unless your health care provider tells you to do that. Do not take baths, swim,  or use a hot tub until your health care provider approves. Check your incision area every day for signs of infection. Check for: More redness, swelling, or pain. More fluid or blood. Warmth. Pus or a bad smell. Managing pain, stiffness, and swelling  If directed, put ice on the affected area. To do this: Put ice in a plastic bag. Place a towel between your skin and the bag. Leave the ice on for 20 minutes, 2-3 times a day. Remove the ice if your skin turns bright red. This is very  important. If you cannot feel pain, heat, or cold, you have a greater risk of damage to the area. Move your toes often to reduce stiffness and swelling. Raise (elevate) your leg above the level of your heart while you are lying down. Try putting a few pillows under your leg. This will take some of the pressure off your hip. Activity Ask your health care provider what activities are safe for you. Avoid sitting for a long time without moving. Get up to take short walks every 1-2 hours. This is important to improve blood flow and breathing. Ask for help if you feel weak or unsteady. Do exercises as told by your health care provider. Follow instructions from your health care provider about using a walker, crutches, or a cane. You may use your legs to support (bear) your body weight as told by your health care provider. Follow instructions about how much weight you may safely support on your affected leg (weight-bearing restrictions). A physical therapist may show you how to get out of a bed and chair and how to go up and down stairs. You will first do this with a walker, crutches, or a cane and then without any of these devices. When you can walk without a limp, you may stop using a walker, crutches, or a cane. Return to your normal activities as told by your health care provider. Safety To help prevent falls, keep floors clear of objects you may trip over, and place items that you may need within easy reach. Wear an apron or tool belt with pockets for carrying objects. This leaves your hands free to help with your balance. General instructions Ask your health care provider when it is safe to drive. Wear compression stockings as told by your health care provider. These stockings help to prevent blood clots and reduce swelling in your legs. Continue with breathing exercises. These help prevent lung infection. Do not use any products that contain nicotine or tobacco, such as cigarettes, e-cigarettes,  and chewing tobacco. These can delay healing after surgery. If you need help quitting, ask your health care provider. Tell your health care provider if you plan to have dental work. Also: Tell your dentist about your joint replacement. Ask your health care provider if there are any special instructions you need to follow before having dental care and routine cleanings. Keep all follow-up visits. This is important. Contact a health care provider if: You have a fever or chills. You have a cough. Your medicine is not controlling your pain. You have more redness, swelling, or pain around your incision. You have more fluid or blood coming from your incision. Your incision feels warm to the touch. You have pus or a bad smell coming from your incision. Get help right away if: You have severe pain. You have shortness of breath or trouble breathing. You have chest pain. You have redness, swelling, pain, or warmth in your calf or leg. Your incision  breaks open after sutures or staples are removed. These symptoms may represent a serious problem that is an emergency. Do not wait to see if the symptoms will go away. Get medical help right away. Call your local emergency services (911 in the [place] Do not drive yourself to the hospital. Summary Follow instructions from your health care provider about how to take care of your incision. Do not take baths, swim, or use a hot tub until your health care provider approves. Use crutches, a walker, or a cane as told by your health care provider. Make sure you know which symptoms should cause you to get medical help right away. This information is not intended to replace advice given to you by your health care provider. Make sure you discuss any questions you have with your health care provider. Document Revised: 07/18/2019 Document Reviewed: 07/18/2019 Elsevier Patient Education  [place] Anesthesia, Adult, Care After This sheet gives you  information about how to care for yourself after your procedure. Your health care provider may also give you more specific instructions. If you have problems or questions, contact your health care provider. What can I expect after the procedure? After the procedure, the following side effects are common: Pain or discomfort at the IV site. Nausea. Vomiting. Sore throat. Trouble concentrating. Feeling cold or chills. Feeling weak or tired. Sleepiness and fatigue. Soreness and body aches. These side effects can affect parts of the body that were not involved in surgery. Follow these instructions at home: For the time period you were told by your health care provider:  Rest. Do not participate in activities where you could fall or become injured. Do not drive or use machinery. Do not drink alcohol. Do not take sleeping pills or medicines that cause drowsiness. Do not make important decisions or sign legal documents. Do not take care of children on your own. Eating and drinking Follow any instructions from your health care provider about eating or drinking restrictions. When you feel hungry, start by eating small amounts of foods that are soft and easy to digest (bland), such as toast. Gradually return to your regular diet. Drink enough fluid to keep your urine pale yellow. If you vomit, rehydrate by drinking water, juice, or clear broth. General instructions If you have sleep apnea, surgery and certain medicines can increase your risk for breathing problems. Follow instructions from your health care provider about wearing your sleep device: Anytime you are sleeping, including during daytime naps. While taking prescription pain medicines, sleeping medicines, or medicines that make you drowsy. Have a responsible adult stay with you for the time you are told. It is important to have someone help care for you until you are awake and alert. Return to your normal activities as told by your  health care provider. Ask your health care provider what activities are safe for you. Take over-the-counter and prescription medicines only as told by your health care provider. If you smoke, do not smoke without supervision. Keep all follow-up visits as told by your health care provider. This is important. Contact a health care provider if: You have nausea or vomiting that does not get better with medicine. You cannot eat or drink without vomiting. You have pain that does not get better with medicine. You are unable to pass urine. You develop a skin rash. You have a fever. You have redness around your IV site that gets worse. Get help right away if: You have difficulty breathing. You have chest pain.  You have blood in your urine or stool, or you vomit blood. Summary After the procedure, it is common to have a sore throat or nausea. It is also common to feel tired. Have a responsible adult stay with you for the time you are told. It is important to have someone help care for you until you are awake and alert. When you feel hungry, start by eating small amounts of foods that are soft and easy to digest (bland), such as toast. Gradually return to your regular diet. Drink enough fluid to keep your urine pale yellow. Return to your normal activities as told by your health care provider. Ask your health care provider what activities are safe for you. This information is not intended to replace advice given to you by your health care provider. Make sure you discuss any questions you have with your health care provider. Document Revised: 11/07/2019 Document Reviewed: 06/06/2019 Elsevier Patient Education  2022 [place]. How to Use Chlorhexidine for Bathing Chlorhexidine gluconate (CHG) is a germ-killing (antiseptic) solution that is used to clean the skin. It can get rid of the bacteria that normally live on the skin and can keep them away for about 24 hours. To clean your skin with CHG, you  may be given: A CHG solution to use in the shower or as part of a sponge bath. A prepackaged cloth that contains CHG. Cleaning your skin with CHG may help lower the risk for infection: While you are staying in the intensive care unit of the hospital. If you have a vascular access, such as a central line, to provide short-term or long-term access to your veins. If you have a catheter to drain urine from your bladder. If you are on a ventilator. A ventilator is a machine that helps you breathe by moving air in and out of your lungs. After surgery. What are the risks? Risks of using CHG include: A skin reaction. Hearing loss, if CHG gets in your ears and you have a perforated eardrum. Eye injury, if CHG gets in your eyes and is not rinsed out. The CHG product catching fire. Make sure that you avoid smoking and flames after applying CHG to your skin. Do not use CHG: If you have a chlorhexidine allergy or have previously reacted to chlorhexidine. On babies younger than 37 months of age. How to use CHG solution Use CHG only as told by your health care provider, and follow the instructions on the label. Use the full amount of CHG as directed. Usually, this is one bottle. During a shower Follow these steps when using CHG solution during a shower (unless your health care provider gives you different instructions): Start the shower. Use your normal soap and shampoo to wash your face and hair. Turn off the shower or move out of the shower stream. Pour the CHG onto a clean washcloth. Do not use any type of brush or rough-edged sponge. Starting at your neck, lather your body down to your toes. Make sure you follow these instructions: If you will be having surgery, pay special attention to the part of your body where you will be having surgery. Scrub this area for at least 1 minute. Do not use CHG on your head or face. If the solution gets into your ears or eyes, rinse them well with water. Avoid  your genital area. Avoid any areas of skin that have broken skin, cuts, or scrapes. Scrub your back and under your arms. Make sure to wash skin  folds. Let the lather sit on your skin for 1-2 minutes or as long as told by your health care provider. Thoroughly rinse your entire body in the shower. Make sure that all body creases and crevices are rinsed well. Dry off with a clean towel. Do not put any substances on your body afterward--such as powder, lotion, or perfume--unless you are told to do so by your health care provider. Only use lotions that are recommended by the manufacturer. Put on clean clothes or pajamas. If it is the night before your surgery, sleep in clean sheets.  During a sponge bath Follow these steps when using CHG solution during a sponge bath (unless your health care provider gives you different instructions): Use your normal soap and shampoo to wash your face and hair. Pour the CHG onto a clean washcloth. Starting at your neck, lather your body down to your toes. Make sure you follow these instructions: If you will be having surgery, pay special attention to the part of your body where you will be having surgery. Scrub this area for at least 1 minute. Do not use CHG on your head or face. If the solution gets into your ears or eyes, rinse them well with water. Avoid your genital area. Avoid any areas of skin that have broken skin, cuts, or scrapes. Scrub your back and under your arms. Make sure to wash skin folds. Let the lather sit on your skin for 1-2 minutes or as long as told by your health care provider. Using a different clean, wet washcloth, thoroughly rinse your entire body. Make sure that all body creases and crevices are rinsed well. Dry off with a clean towel. Do not put any substances on your body afterward--such as powder, lotion, or perfume--unless you are told to do so by your health care provider. Only use lotions that are recommended by the manufacturer. Put  on clean clothes or pajamas. If it is the night before your surgery, sleep in clean sheets. How to use CHG prepackaged cloths Only use CHG cloths as told by your health care provider, and follow the instructions on the label. Use the CHG cloth on clean, dry skin. Do not use the CHG cloth on your head or face unless your health care provider tells you to. When washing with the CHG cloth: Avoid your genital area. Avoid any areas of skin that have broken skin, cuts, or scrapes. Before surgery Follow these steps when using a CHG cloth to clean before surgery (unless your health care provider gives you different instructions): Using the CHG cloth, vigorously scrub the part of your body where you will be having surgery. Scrub using a back-and-forth motion for 3 minutes. The area on your body should be completely wet with CHG when you are done scrubbing. Do not rinse. Discard the cloth and let the area air-dry. Do not put any substances on the area afterward, such as powder, lotion, or perfume. Put on clean clothes or pajamas. If it is the night before your surgery, sleep in clean sheets.  For general bathing Follow these steps when using CHG cloths for general bathing (unless your health care provider gives you different instructions). Use a separate CHG cloth for each area of your body. Make sure you wash between any folds of skin and between your fingers and toes. Wash your body in the following order, switching to a new cloth after each step: The front of your neck, shoulders, and chest. Both of your arms, under your  arms, and your hands. Your stomach and groin area, avoiding the genitals. Your right leg and foot. Your left leg and foot. The back of your neck, your back, and your buttocks. Do not rinse. Discard the cloth and let the area air-dry. Do not put any substances on your body afterward--such as powder, lotion, or perfume--unless you are told to do so by your health care provider. Only  use lotions that are recommended by the manufacturer. Put on clean clothes or pajamas. Contact a health care provider if: Your skin gets irritated after scrubbing. You have questions about using your solution or cloth. You swallow any chlorhexidine. Call your local poison control center (1-587-553-6920 in the [place] Get help right away if: Your eyes itch badly, or they become very red or swollen. Your skin itches badly and is red or swollen. Your hearing changes. You have trouble seeing. You have swelling or tingling in your mouth or throat. You have trouble breathing. These symptoms may represent a serious problem that is an emergency. Do not wait to see if the symptoms will go away. Get medical help right away. Call your local emergency services (911 in the [place] Do not drive yourself to the hospital. Summary Chlorhexidine gluconate (CHG) is a germ-killing (antiseptic) solution that is used to clean the skin. Cleaning your skin with CHG may help to lower your risk for infection. You may be given CHG to use for bathing. It may be in a bottle or in a prepackaged cloth to use on your skin. Carefully follow your health care provider's instructions and the instructions on the product label. Do not use CHG if you have a chlorhexidine allergy. Contact your health care provider if your skin gets irritated after scrubbing. This information is not intended to replace advice given to you by your health care provider. Make sure you discuss any questions you have with your health care provider. Document Revised: 05/04/2020 Document Reviewed: 05/04/2020 Elsevier Patient Education  2022 [employer].

## 2020-11-12 ENCOUNTER — Encounter: Payer: Self-pay | Admitting: Orthopedic Surgery

## 2020-11-12 ENCOUNTER — Ambulatory Visit: Payer: Medicare Other

## 2020-11-12 ENCOUNTER — Other Ambulatory Visit (HOSPITAL_COMMUNITY)
Admission: RE | Admit: 2020-11-12 | Discharge: 2020-11-12 | Disposition: A | Discharge status: Home / Self Care | Payer: Medicare Other | Source: Ambulatory Visit | Attending: Orthopedic Surgery | Admitting: Orthopedic Surgery

## 2020-11-12 ENCOUNTER — Encounter (HOSPITAL_COMMUNITY)
Admission: RE | Admit: 2020-11-12 | Discharge: 2020-11-12 | Disposition: A | Discharge status: Home / Self Care | Payer: Medicare Other | Source: Ambulatory Visit | Attending: Orthopedic Surgery | Admitting: Orthopedic Surgery

## 2020-11-12 ENCOUNTER — Other Ambulatory Visit: Payer: Self-pay

## 2020-11-12 ENCOUNTER — Ambulatory Visit (INDEPENDENT_AMBULATORY_CARE_PROVIDER_SITE_OTHER): Payer: Medicare Other | Admitting: Orthopedic Surgery

## 2020-11-12 ENCOUNTER — Encounter (HOSPITAL_COMMUNITY): Payer: Self-pay

## 2020-11-12 VITALS — Ht 67.0 in | Wt 162.0 lb

## 2020-11-12 DIAGNOSIS — Z20822 Contact with and (suspected) exposure to covid-19: Secondary | ICD-10-CM | POA: Diagnosis not present

## 2020-11-12 DIAGNOSIS — S72001A Fracture of unspecified part of neck of right femur, initial encounter for closed fracture: Secondary | ICD-10-CM

## 2020-11-12 DIAGNOSIS — Z01812 Encounter for preprocedural laboratory examination: Secondary | ICD-10-CM | POA: Diagnosis not present

## 2020-11-12 HISTORY — DX: Anxiety disorder, unspecified: F41.9

## 2020-11-12 LAB — CBC
HCT: 35.6 % — ABNORMAL LOW (ref 36.0–46.0)
Hemoglobin: 11.5 g/dL — ABNORMAL LOW (ref 12.0–15.0)
MCH: 31.3 pg (ref 26.0–34.0)
MCHC: 32.3 g/dL (ref 30.0–36.0)
MCV: 96.7 fL (ref 80.0–100.0)
Platelets: 218 10*3/uL (ref 150–400)
RBC: 3.68 MIL/uL — ABNORMAL LOW (ref 3.87–5.11)
RDW: 13.6 % (ref 11.5–15.5)
WBC: 4.6 10*3/uL (ref 4.0–10.5)
nRBC: 0 % (ref 0.0–0.2)

## 2020-11-12 LAB — SARS CORONAVIRUS 2 (TAT 6-24 HRS): SARS Coronavirus 2: NEGATIVE

## 2020-11-12 LAB — BASIC METABOLIC PANEL
Anion gap: 11 (ref 5–15)
BUN: 11 mg/dL (ref 8–23)
CO2: 26 mmol/L (ref 22–32)
Calcium: 9.8 mg/dL (ref 8.9–10.3)
Chloride: 98 mmol/L (ref 98–111)
Creatinine, Ser: 0.69 mg/dL (ref 0.44–1.00)
GFR, Estimated: >60 mL/min (ref >60)
Glucose, Bld: 111 mg/dL — ABNORMAL HIGH (ref 70–99)
Potassium: 4.1 mmol/L (ref 3.5–5.1)
Sodium: 135 mmol/L (ref 135–145)

## 2020-11-12 NOTE — H&P (View-Only) (Signed)
Orthopaedic Postop Note  Assessment: Shari Branch is a 75 y.o. female s/p CRPP of right nondisplaced femoral neck fracture  DOS: 10/07/2020  Plan: Repeat radiographs demonstrates interval varus displacement of the femoral neck fracture.  She continues to have pain in her hip and is having difficulty ambulating.  As a result, I have recommended removal of the cannulated screws and revision to a right hip hemiarthroplasty.  The surgery was discussed in detail.  All questions were answered.  She wishes to proceed.   Risks and benefits of the surgery, including, but not limited to infection, bleeding, persistent pain, dislocation, need for further surgery, non-union, malunion and more severe complications associated with anesthesia were discussed with the patient.  The patient has elected to proceed.  We will plan to proceed with surgery 11/16/20.  She is scheduled for a preoperative evaluation later today  Surgical Plan  Procedure:  Removal of cannulated screws from femoral neck; revise to a right hip hemiarthroplasty Disposition: Surgical admit Anesthesia: Choice Medical Comorbidities: COPD, smoker   Follow-up: Return for After surgery. XR at next visit: AP pelvis, right hip  Subjective:  Chief Complaint  Patient presents with   Routine Post Op    Rt hip DOS 10/07/20     History of Present Illness: Shari Branch is a 75 y.o. female who presents following the above stated procedure.  Surgery was approximately 5 weeks ago.  She has been at home for a couple of weeks.  She continues to have pain and difficulty ambulating.   Pain is in her groin.  She also has some pain in her knee, radiating distally into her shin.  No issues with her surgical incision.  No fevers or chills.   Review of Systems: No fevers or chills No numbness or tingling No Chest Pain No shortness of breath   Objective: Ht '5\' 7"'$  (1.702 m)   Wt 162 lb (73.5 kg)   BMI 25.37 kg/m   Physical Exam:  Elderly  female.  Seated in a wheelchair.  No acute distress.  Alert and oriented.  Evaluation of right hip demonstrates a well-healed surgical incision over the lateral hip.  She is able to maintain a straight leg raise.  Active motion intact in the EHL/TA.  Sensation intact to the dorsum of her foot.  Pain and catching sensation in her groin with gentle range of motion.  No swelling in her knee.  No tenderness to palpation of the medial or lateral joint line.     IMAGING: I personally ordered and reviewed the following images:   XR of the right hip demonstrates a femoral neck fracture with varus displacement.  No interval displacement compared to most recent XR.  Hardware is intact, it has not cut out of the femoral head.  The screws are not backing out.    Impression: displaced right femoral neck fracture with loss of reduction; no hardware complications   Mordecai Rasmussen, MD 11/12/2020 12:04 PM

## 2020-11-12 NOTE — Progress Notes (Signed)
Orthopaedic Postop Note  Assessment: Shari Branch is a 75 y.o. female s/p CRPP of right nondisplaced femoral neck fracture  DOS: 10/07/2020  Plan: Repeat radiographs demonstrates interval varus displacement of the femoral neck fracture.  She continues to have pain in her hip and is having difficulty ambulating.  As a result, I have recommended removal of the cannulated screws and revision to a right hip hemiarthroplasty.  The surgery was discussed in detail.  All questions were answered.  She wishes to proceed.   Risks and benefits of the surgery, including, but not limited to infection, bleeding, persistent pain, dislocation, need for further surgery, non-union, malunion and more severe complications associated with anesthesia were discussed with the patient.  The patient has elected to proceed.  We will plan to proceed with surgery 11/16/20.  She is scheduled for a preoperative evaluation later today  Surgical Plan  Procedure:  Removal of cannulated screws from femoral neck; revise to a right hip hemiarthroplasty Disposition: Surgical admit Anesthesia: Choice Medical Comorbidities: COPD, smoker   Follow-up: Return for After surgery. XR at next visit: AP pelvis, right hip  Subjective:  Chief Complaint  Patient presents with   Routine Post Op    Rt hip DOS 10/07/20     History of Present Illness: Shari Branch is a 75 y.o. female who presents following the above stated procedure.  Surgery was approximately 5 weeks ago.  She has been at home for a couple of weeks.  She continues to have pain and difficulty ambulating.   Pain is in her groin.  She also has some pain in her knee, radiating distally into her shin.  No issues with her surgical incision.  No fevers or chills.   Review of Systems: No fevers or chills No numbness or tingling No Chest Pain No shortness of breath   Objective: Ht '5\' 7"'$  (1.702 m)   Wt 162 lb (73.5 kg)   BMI 25.37 kg/m   Physical Exam:  Elderly  female.  Seated in a wheelchair.  No acute distress.  Alert and oriented.  Evaluation of right hip demonstrates a well-healed surgical incision over the lateral hip.  She is able to maintain a straight leg raise.  Active motion intact in the EHL/TA.  Sensation intact to the dorsum of her foot.  Pain and catching sensation in her groin with gentle range of motion.  No swelling in her knee.  No tenderness to palpation of the medial or lateral joint line.     IMAGING: I personally ordered and reviewed the following images:   XR of the right hip demonstrates a femoral neck fracture with varus displacement.  No interval displacement compared to most recent XR.  Hardware is intact, it has not cut out of the femoral head.  The screws are not backing out.    Impression: displaced right femoral neck fracture with loss of reduction; no hardware complications   Mordecai Rasmussen, MD 11/12/2020 12:04 PM

## 2020-11-15 DIAGNOSIS — Z9181 History of falling: Secondary | ICD-10-CM | POA: Insufficient documentation

## 2020-11-16 ENCOUNTER — Encounter: Payer: Medicare Other | Admitting: Orthopedic Surgery

## 2020-11-16 ENCOUNTER — Inpatient Hospital Stay (HOSPITAL_COMMUNITY): Payer: Medicare Other | Admitting: Certified Registered Nurse Anesthetist

## 2020-11-16 ENCOUNTER — Inpatient Hospital Stay (HOSPITAL_COMMUNITY)
Admission: RE | Admit: 2020-11-16 | Discharge: 2020-11-23 | DRG: 470 | Disposition: A | Discharge status: Skilled Nursing Facility | Payer: Medicare Other | Attending: Orthopedic Surgery | Admitting: Orthopedic Surgery

## 2020-11-16 ENCOUNTER — Encounter (HOSPITAL_COMMUNITY)
Admission: RE | Disposition: A | Discharge status: Skilled Nursing Facility | Payer: Self-pay | Source: Home / Self Care | Attending: Orthopedic Surgery

## 2020-11-16 ENCOUNTER — Other Ambulatory Visit: Payer: Self-pay

## 2020-11-16 ENCOUNTER — Encounter (HOSPITAL_COMMUNITY): Payer: Self-pay | Admitting: Orthopedic Surgery

## 2020-11-16 ENCOUNTER — Inpatient Hospital Stay (HOSPITAL_COMMUNITY): Payer: Medicare Other

## 2020-11-16 DIAGNOSIS — I517 Cardiomegaly: Secondary | ICD-10-CM | POA: Diagnosis not present

## 2020-11-16 DIAGNOSIS — S72001A Fracture of unspecified part of neck of right femur, initial encounter for closed fracture: Secondary | ICD-10-CM

## 2020-11-16 DIAGNOSIS — T84194A Other mechanical complication of internal fixation device of right femur, initial encounter: Secondary | ICD-10-CM

## 2020-11-16 DIAGNOSIS — Z471 Aftercare following joint replacement surgery: Secondary | ICD-10-CM | POA: Diagnosis not present

## 2020-11-16 DIAGNOSIS — E785 Hyperlipidemia, unspecified: Secondary | ICD-10-CM | POA: Diagnosis present

## 2020-11-16 DIAGNOSIS — K7581 Nonalcoholic steatohepatitis (NASH): Secondary | ICD-10-CM | POA: Diagnosis not present

## 2020-11-16 DIAGNOSIS — R1310 Dysphagia, unspecified: Secondary | ICD-10-CM | POA: Diagnosis not present

## 2020-11-16 DIAGNOSIS — J449 Chronic obstructive pulmonary disease, unspecified: Secondary | ICD-10-CM | POA: Diagnosis present

## 2020-11-16 DIAGNOSIS — G500 Trigeminal neuralgia: Secondary | ICD-10-CM | POA: Diagnosis present

## 2020-11-16 DIAGNOSIS — R262 Difficulty in walking, not elsewhere classified: Secondary | ICD-10-CM | POA: Diagnosis present

## 2020-11-16 DIAGNOSIS — F419 Anxiety disorder, unspecified: Secondary | ICD-10-CM | POA: Diagnosis present

## 2020-11-16 DIAGNOSIS — R338 Other retention of urine: Secondary | ICD-10-CM | POA: Diagnosis not present

## 2020-11-16 DIAGNOSIS — D62 Acute posthemorrhagic anemia: Secondary | ICD-10-CM | POA: Diagnosis not present

## 2020-11-16 DIAGNOSIS — E039 Hypothyroidism, unspecified: Secondary | ICD-10-CM | POA: Diagnosis not present

## 2020-11-16 DIAGNOSIS — Z466 Encounter for fitting and adjustment of urinary device: Secondary | ICD-10-CM | POA: Diagnosis not present

## 2020-11-16 DIAGNOSIS — R2681 Unsteadiness on feet: Secondary | ICD-10-CM | POA: Diagnosis not present

## 2020-11-16 DIAGNOSIS — Z8781 Personal history of (healed) traumatic fracture: Secondary | ICD-10-CM

## 2020-11-16 DIAGNOSIS — Y793 Surgical instruments, materials and orthopedic devices (including sutures) associated with adverse incidents: Secondary | ICD-10-CM | POA: Diagnosis present

## 2020-11-16 DIAGNOSIS — D500 Iron deficiency anemia secondary to blood loss (chronic): Secondary | ICD-10-CM | POA: Diagnosis not present

## 2020-11-16 DIAGNOSIS — R339 Retention of urine, unspecified: Secondary | ICD-10-CM | POA: Diagnosis not present

## 2020-11-16 DIAGNOSIS — T8484XA Pain due to internal orthopedic prosthetic devices, implants and grafts, initial encounter: Secondary | ICD-10-CM | POA: Diagnosis present

## 2020-11-16 DIAGNOSIS — K760 Fatty (change of) liver, not elsewhere classified: Secondary | ICD-10-CM | POA: Diagnosis present

## 2020-11-16 DIAGNOSIS — Z882 Allergy status to sulfonamides status: Secondary | ICD-10-CM | POA: Diagnosis not present

## 2020-11-16 DIAGNOSIS — Z96641 Presence of right artificial hip joint: Secondary | ICD-10-CM | POA: Diagnosis not present

## 2020-11-16 DIAGNOSIS — R0902 Hypoxemia: Secondary | ICD-10-CM | POA: Diagnosis not present

## 2020-11-16 DIAGNOSIS — I69991 Dysphagia following unspecified cerebrovascular disease: Secondary | ICD-10-CM | POA: Insufficient documentation

## 2020-11-16 DIAGNOSIS — K219 Gastro-esophageal reflux disease without esophagitis: Secondary | ICD-10-CM | POA: Diagnosis not present

## 2020-11-16 DIAGNOSIS — F17210 Nicotine dependence, cigarettes, uncomplicated: Secondary | ICD-10-CM | POA: Diagnosis present

## 2020-11-16 DIAGNOSIS — S72042A Displaced fracture of base of neck of left femur, initial encounter for closed fracture: Secondary | ICD-10-CM | POA: Diagnosis not present

## 2020-11-16 DIAGNOSIS — M6281 Muscle weakness (generalized): Secondary | ICD-10-CM | POA: Diagnosis not present

## 2020-11-16 DIAGNOSIS — R41841 Cognitive communication deficit: Secondary | ICD-10-CM | POA: Diagnosis not present

## 2020-11-16 DIAGNOSIS — Z20822 Contact with and (suspected) exposure to covid-19: Secondary | ICD-10-CM | POA: Diagnosis present

## 2020-11-16 HISTORY — PX: HIP ARTHROPLASTY: SHX981

## 2020-11-16 HISTORY — PX: HARDWARE REMOVAL: SHX979

## 2020-11-16 LAB — ABO/RH: ABO/RH(D): A POS

## 2020-11-16 LAB — HEMOGLOBIN AND HEMATOCRIT, BLOOD
HCT: 26 % — ABNORMAL LOW (ref 36.0–46.0)
Hemoglobin: 8.6 g/dL — ABNORMAL LOW (ref 12.0–15.0)

## 2020-11-16 SURGERY — REMOVAL, HARDWARE
Anesthesia: General | Site: Hip | Laterality: Right

## 2020-11-16 MED ORDER — AMITRIPTYLINE HCL 25 MG PO TABS
150.0000 mg | ORAL_TABLET | Freq: Every day | ORAL | Status: DC
  Administered 2020-11-16 – 2020-11-22 (×7): 150 mg via ORAL
  Filled 2020-11-16: qty 6
  Filled 2020-11-16: qty 3
  Filled 2020-11-16 (×2): qty 6
  Filled 2020-11-16 (×4): qty 3
  Filled 2020-11-16: qty 6
  Filled 2020-11-16 (×2): qty 3
  Filled 2020-11-16 (×3): qty 6

## 2020-11-16 MED ORDER — FENTANYL CITRATE PF 50 MCG/ML IJ SOSY: 25.0000 ug | PREFILLED_SYRINGE | INTRAMUSCULAR | Status: DC | PRN

## 2020-11-16 MED ORDER — METOPROLOL TARTRATE 25 MG PO TABS
25.0000 mg | ORAL_TABLET | Freq: Two times a day (BID) | ORAL | Status: DC
  Administered 2020-11-17 – 2020-11-23 (×10): 25 mg via ORAL
  Filled 2020-11-16 (×13): qty 1

## 2020-11-16 MED ORDER — ASPIRIN 81 MG PO CHEW
81.0000 mg | CHEWABLE_TABLET | Freq: Two times a day (BID) | ORAL | Status: DC
  Administered 2020-11-17 – 2020-11-23 (×13): 81 mg via ORAL
  Filled 2020-11-16 (×13): qty 1

## 2020-11-16 MED ORDER — ONDANSETRON HCL 4 MG/2ML IJ SOLN: 4.0000 mg | Freq: Once | INTRAMUSCULAR | Status: DC | PRN

## 2020-11-16 MED ORDER — FENTANYL CITRATE (PF) 100 MCG/2ML IJ SOLN
INTRAMUSCULAR | Status: DC | PRN
  Administered 2020-11-16 (×5): 50 ug via INTRAVENOUS

## 2020-11-16 MED ORDER — TRANEXAMIC ACID-NACL 1000-0.7 MG/100ML-% IV SOLN
INTRAVENOUS | Status: AC
  Filled 2020-11-16: qty 100

## 2020-11-16 MED ORDER — PROPOFOL 10 MG/ML IV BOLUS
INTRAVENOUS | Status: AC
  Filled 2020-11-16: qty 20

## 2020-11-16 MED ORDER — SODIUM CHLORIDE 0.9 % IR SOLN
Status: DC | PRN
  Administered 2020-11-16: 6000 mL

## 2020-11-16 MED ORDER — DEXAMETHASONE SODIUM PHOSPHATE 10 MG/ML IJ SOLN
INTRAMUSCULAR | Status: AC
  Filled 2020-11-16: qty 1

## 2020-11-16 MED ORDER — SUGAMMADEX SODIUM 200 MG/2ML IV SOLN: INTRAVENOUS | Status: DC | PRN

## 2020-11-16 MED ORDER — ONDANSETRON HCL 4 MG/2ML IJ SOLN
INTRAMUSCULAR | Status: AC
  Filled 2020-11-16: qty 2

## 2020-11-16 MED ORDER — ONDANSETRON HCL 4 MG/2ML IJ SOLN
INTRAMUSCULAR | Status: DC | PRN
  Administered 2020-11-16: 4 mg via INTRAVENOUS

## 2020-11-16 MED ORDER — PHENYLEPHRINE HCL (PRESSORS) 10 MG/ML IV SOLN
INTRAVENOUS | Status: AC
  Filled 2020-11-16: qty 2

## 2020-11-16 MED ORDER — DOCUSATE SODIUM 100 MG PO CAPS
100.0000 mg | ORAL_CAPSULE | Freq: Two times a day (BID) | ORAL | Status: DC
  Administered 2020-11-16 – 2020-11-23 (×15): 100 mg via ORAL
  Filled 2020-11-16 (×15): qty 1

## 2020-11-16 MED ORDER — DIPHENHYDRAMINE HCL 12.5 MG/5ML PO ELIX: 12.5000 mg | ORAL_SOLUTION | ORAL | Status: DC | PRN

## 2020-11-16 MED ORDER — FENTANYL CITRATE (PF) 250 MCG/5ML IJ SOLN
INTRAMUSCULAR | Status: AC
  Filled 2020-11-16: qty 5

## 2020-11-16 MED ORDER — SUGAMMADEX SODIUM 200 MG/2ML IV SOLN
INTRAVENOUS | Status: DC | PRN
  Administered 2020-11-16: 200 mg via INTRAVENOUS

## 2020-11-16 MED ORDER — PHENYLEPHRINE 40 MCG/ML (10ML) SYRINGE FOR IV PUSH (FOR BLOOD PRESSURE SUPPORT)
PREFILLED_SYRINGE | INTRAVENOUS | Status: AC
  Filled 2020-11-16: qty 10

## 2020-11-16 MED ORDER — ROCURONIUM BROMIDE 10 MG/ML (PF) SYRINGE
PREFILLED_SYRINGE | INTRAVENOUS | Status: AC
  Filled 2020-11-16: qty 10

## 2020-11-16 MED ORDER — ORAL CARE MOUTH RINSE: 15.0000 mL | Freq: Once | OROMUCOSAL | Status: AC

## 2020-11-16 MED ORDER — LEVOTHYROXINE SODIUM 112 MCG PO TABS
112.0000 ug | ORAL_TABLET | Freq: Every day | ORAL | Status: DC
  Administered 2020-11-16 – 2020-11-23 (×8): 112 ug via ORAL
  Filled 2020-11-16 (×9): qty 1

## 2020-11-16 MED ORDER — KETAMINE HCL 50 MG/5ML IJ SOSY
PREFILLED_SYRINGE | INTRAMUSCULAR | Status: AC
  Filled 2020-11-16: qty 5

## 2020-11-16 MED ORDER — 0.9 % SODIUM CHLORIDE (POUR BTL) OPTIME
TOPICAL | Status: DC | PRN
  Administered 2020-11-16: 1000 mL

## 2020-11-16 MED ORDER — KETAMINE HCL 10 MG/ML IJ SOLN
INTRAMUSCULAR | Status: DC | PRN
  Administered 2020-11-16: 20 mg via INTRAVENOUS

## 2020-11-16 MED ORDER — PHENYLEPHRINE HCL (PRESSORS) 10 MG/ML IV SOLN
INTRAVENOUS | Status: DC | PRN
  Administered 2020-11-16 (×3): 80 ug via INTRAVENOUS

## 2020-11-16 MED ORDER — TRANEXAMIC ACID-NACL 1000-0.7 MG/100ML-% IV SOLN
1000.0000 mg | INTRAVENOUS | Status: AC
  Administered 2020-11-16: 1000 mg via INTRAVENOUS

## 2020-11-16 MED ORDER — ONDANSETRON HCL 4 MG/2ML IJ SOLN: 4.0000 mg | Freq: Four times a day (QID) | INTRAMUSCULAR | Status: DC | PRN

## 2020-11-16 MED ORDER — OXYCODONE HCL 5 MG PO TABS
10.0000 mg | ORAL_TABLET | ORAL | Status: DC | PRN
  Filled 2020-11-16: qty 2

## 2020-11-16 MED ORDER — PHENYLEPHRINE HCL-NACL 20-0.9 MG/250ML-% IV SOLN
INTRAVENOUS | Status: DC | PRN
  Administered 2020-11-16: 20 ug/min via INTRAVENOUS

## 2020-11-16 MED ORDER — VANCOMYCIN HCL 1 G IV SOLR
INTRAVENOUS | Status: DC | PRN
  Administered 2020-11-16: 1000 mg via TOPICAL

## 2020-11-16 MED ORDER — LIDOCAINE HCL URETHRAL/MUCOSAL 2 % EX GEL
CUTANEOUS | Status: DC | PRN
  Administered 2020-11-16: 60 via TOPICAL

## 2020-11-16 MED ORDER — CEFAZOLIN SODIUM-DEXTROSE 2-4 GM/100ML-% IV SOLN
INTRAVENOUS | Status: AC
  Administered 2020-11-16: 2 g via INTRAVENOUS
  Filled 2020-11-16: qty 100

## 2020-11-16 MED ORDER — LACTATED RINGERS IV SOLN
INTRAVENOUS | Status: DC
  Administered 2020-11-16: 1000 mL via INTRAVENOUS

## 2020-11-16 MED ORDER — PANTOPRAZOLE SODIUM 40 MG PO TBEC
40.0000 mg | DELAYED_RELEASE_TABLET | Freq: Two times a day (BID) | ORAL | Status: DC
  Administered 2020-11-16 – 2020-11-23 (×15): 40 mg via ORAL
  Filled 2020-11-16 (×15): qty 1

## 2020-11-16 MED ORDER — ROCURONIUM BROMIDE 10 MG/ML (PF) SYRINGE
PREFILLED_SYRINGE | INTRAVENOUS | Status: DC | PRN
  Administered 2020-11-16: 20 mg via INTRAVENOUS
  Administered 2020-11-16: 40 mg via INTRAVENOUS

## 2020-11-16 MED ORDER — CEFAZOLIN SODIUM-DEXTROSE 2-4 GM/100ML-% IV SOLN
2.0000 g | INTRAVENOUS | Status: AC
  Administered 2020-11-16: 2 g via INTRAVENOUS

## 2020-11-16 MED ORDER — ONDANSETRON HCL 4 MG PO TABS: 4.0000 mg | ORAL_TABLET | Freq: Four times a day (QID) | ORAL | Status: DC | PRN

## 2020-11-16 MED ORDER — PROPOFOL 10 MG/ML IV BOLUS
INTRAVENOUS | Status: DC | PRN
  Administered 2020-11-16: 100 mg via INTRAVENOUS

## 2020-11-16 MED ORDER — ATORVASTATIN CALCIUM 20 MG PO TABS
20.0000 mg | ORAL_TABLET | Freq: Every day | ORAL | Status: DC
  Administered 2020-11-16 – 2020-11-23 (×8): 20 mg via ORAL
  Filled 2020-11-16 (×8): qty 1

## 2020-11-16 MED ORDER — BUPIVACAINE-EPINEPHRINE (PF) 0.5% -1:200000 IJ SOLN
INTRAMUSCULAR | Status: DC | PRN
  Administered 2020-11-16: 30 mL

## 2020-11-16 MED ORDER — GLYCOPYRROLATE PF 0.2 MG/ML IJ SOSY
PREFILLED_SYRINGE | INTRAMUSCULAR | Status: AC
  Filled 2020-11-16: qty 1

## 2020-11-16 MED ORDER — CEFAZOLIN SODIUM-DEXTROSE 2-4 GM/100ML-% IV SOLN
2.0000 g | Freq: Three times a day (TID) | INTRAVENOUS | Status: AC
Start: 2020-11-16 — End: 2020-11-17
  Administered 2020-11-17 (×2): 2 g via INTRAVENOUS
  Filled 2020-11-16 (×3): qty 100

## 2020-11-16 MED ORDER — BUPIVACAINE-EPINEPHRINE (PF) 0.5% -1:200000 IJ SOLN
INTRAMUSCULAR | Status: AC
  Filled 2020-11-16: qty 30

## 2020-11-16 MED ORDER — DEXAMETHASONE SODIUM PHOSPHATE 10 MG/ML IJ SOLN
INTRAMUSCULAR | Status: DC | PRN
  Administered 2020-11-16: 5 mg via INTRAVENOUS

## 2020-11-16 MED ORDER — OXYCODONE HCL 5 MG PO TABS
5.0000 mg | ORAL_TABLET | ORAL | Status: DC | PRN
  Administered 2020-11-16 – 2020-11-22 (×8): 5 mg via ORAL
  Filled 2020-11-16 (×8): qty 1

## 2020-11-16 MED ORDER — VANCOMYCIN HCL 1000 MG IV SOLR
INTRAVENOUS | Status: AC
  Filled 2020-11-16: qty 20

## 2020-11-16 MED ORDER — ESCITALOPRAM OXALATE 10 MG PO TABS
20.0000 mg | ORAL_TABLET | Freq: Every day | ORAL | Status: DC
  Administered 2020-11-16 – 2020-11-23 (×8): 20 mg via ORAL
  Filled 2020-11-16 (×8): qty 2

## 2020-11-16 MED ORDER — CHLORHEXIDINE GLUCONATE 0.12 % MT SOLN
15.0000 mL | Freq: Once | OROMUCOSAL | Status: AC
  Administered 2020-11-16: 15 mL via OROMUCOSAL

## 2020-11-16 MED ORDER — ACETAMINOPHEN 500 MG PO TABS
1000.0000 mg | ORAL_TABLET | Freq: Three times a day (TID) | ORAL | Status: DC
  Administered 2020-11-16 – 2020-11-23 (×19): 1000 mg via ORAL
  Filled 2020-11-16 (×22): qty 2

## 2020-11-16 SURGICAL SUPPLY — 69 items
APL PRP STRL LF DISP 70% ISPRP (MISCELLANEOUS) ×2
BALL HIP ARTICU 28 +5 (Hips) IMPLANT
BIPOLAR DEPUY 47 (Hips) ×2 IMPLANT
BIT DRILL 2.8X128 (BIT) ×2 IMPLANT
BLADE SAGITTAL 25.0X1.27X90 (BLADE) ×2 IMPLANT
BLADE SURG SZ10 CARB STEEL (BLADE) ×2 IMPLANT
BNDG COHESIVE 4X5 TAN STRL (GAUZE/BANDAGES/DRESSINGS) ×2 IMPLANT
BRUSH FEMORAL CANAL (MISCELLANEOUS) ×2 IMPLANT
CEMENT HV SMART SET (Cement) ×2 IMPLANT
CEMENT RESTRICTOR DEPUY SZ 4 (Cement) ×1 IMPLANT
CHLORAPREP W/TINT 26 (MISCELLANEOUS) ×3 IMPLANT
CLOTH BEACON ORANGE TIMEOUT ST (SAFETY) ×2 IMPLANT
COVER LIGHT HANDLE STERIS (MISCELLANEOUS) ×4 IMPLANT
DRAPE HIP W/POCKET STRL (MISCELLANEOUS) ×2 IMPLANT
DRAPE SURG 17X23 STRL (DRAPES) ×2 IMPLANT
DRAPE U-SHAPE 47X51 STRL (DRAPES) ×2 IMPLANT
DRESSING AQUACEL AG ADV 3.5X12 (MISCELLANEOUS) ×1 IMPLANT
DRSG AQUACEL AG ADV 3.5X12 (MISCELLANEOUS) ×2
DRSG AQUACEL AG ADV 3.5X14 (GAUZE/BANDAGES/DRESSINGS) ×1 IMPLANT
DRSG MEPILEX SACRM 8.7X9.8 (GAUZE/BANDAGES/DRESSINGS) ×2 IMPLANT
ELECT REM PT RETURN 9FT ADLT (ELECTROSURGICAL) ×2
ELECTRODE REM PT RTRN 9FT ADLT (ELECTROSURGICAL) ×1 IMPLANT
GLOVE SRG 8 PF TXTR STRL LF DI (GLOVE) ×1 IMPLANT
GLOVE SURG POLYISO LF SZ8 (GLOVE) ×8 IMPLANT
GLOVE SURG UNDER POLY LF SZ7 (GLOVE) ×6 IMPLANT
GLOVE SURG UNDER POLY LF SZ8 (GLOVE) ×2
GOWN STRL REUS W/ TWL XL LVL3 (GOWN DISPOSABLE) ×1 IMPLANT
GOWN STRL REUS W/TWL LRG LVL3 (GOWN DISPOSABLE) ×4 IMPLANT
GOWN STRL REUS W/TWL XL LVL3 (GOWN DISPOSABLE) ×4 IMPLANT
HANDPIECE INTERPULSE COAX TIP (DISPOSABLE) ×2
HEAD BIPOLAR DEPUY 47 (Hips) IMPLANT
HIP BALL ARTICU 28 +5 (Hips) ×2 IMPLANT
INST SET MAJOR BONE (KITS) ×2 IMPLANT
IV NS IRRIG 3000ML ARTHROMATIC (IV SOLUTION) ×3 IMPLANT
KIT BLADEGUARD II DBL (SET/KITS/TRAYS/PACK) ×2 IMPLANT
KIT TURNOVER KIT A (KITS) ×2 IMPLANT
MANIFOLD NEPTUNE II (INSTRUMENTS) ×2 IMPLANT
MARKER SKIN DUAL TIP RULER LAB (MISCELLANEOUS) ×2 IMPLANT
NDL HYPO 18GX1.5 BLUNT FILL (NEEDLE) ×1 IMPLANT
NDL HYPO 21X1.5 SAFETY (NEEDLE) ×1 IMPLANT
NEEDLE HYPO 18GX1.5 BLUNT FILL (NEEDLE) ×2 IMPLANT
NEEDLE HYPO 21X1.5 SAFETY (NEEDLE) ×2 IMPLANT
NS IRRIG 1000ML POUR BTL (IV SOLUTION) ×2 IMPLANT
PACK BASIC LIMB (CUSTOM PROCEDURE TRAY) ×2 IMPLANT
PACK SURGICAL SETUP 50X90 (CUSTOM PROCEDURE TRAY) ×2 IMPLANT
PACK TOTAL JOINT (CUSTOM PROCEDURE TRAY) ×2 IMPLANT
PAD ABD 5X9 TENDERSORB (GAUZE/BANDAGES/DRESSINGS) ×2 IMPLANT
PAD ARMBOARD 7.5X6 YLW CONV (MISCELLANEOUS) ×4 IMPLANT
PENCIL SMOKE EVACUATOR (MISCELLANEOUS) ×2 IMPLANT
PILLOW HIP ABDUCTION SM (ORTHOPEDIC SUPPLIES) ×1 IMPLANT
PIN STMN SNGL STERILE 9X3.6MM (PIN) ×4 IMPLANT
PRESSURIZER FEMORAL UNIV (MISCELLANEOUS) ×2 IMPLANT
SET BASIN LINEN APH (SET/KITS/TRAYS/PACK) ×2 IMPLANT
SET HNDPC FAN SPRY TIP SCT (DISPOSABLE) ×1 IMPLANT
SPONGE T-LAP 18X18 ~~LOC~~+RFID (SPONGE) ×3 IMPLANT
STAPLER VISISTAT (STAPLE) ×2 IMPLANT
STEM SUMMIT CEMENTED BASIC (Hips) ×1 IMPLANT
STRIP CLOSURE SKIN 1/2X4 (GAUZE/BANDAGES/DRESSINGS) ×4 IMPLANT
SUT BRALON NAB BRD #1 30IN (SUTURE) ×4 IMPLANT
SUT MNCRL AB 4-0 PS2 18 (SUTURE) ×2 IMPLANT
SUT MON AB 2-0 CT1 36 (SUTURE) ×4 IMPLANT
SUT VIC AB 1 CT1 27 (SUTURE) ×8
SUT VIC AB 1 CT1 27XBRD ANTBC (SUTURE) ×4 IMPLANT
SYR 20ML LL LF (SYRINGE) ×2 IMPLANT
SYR BULB IRRIG 60ML STRL (SYRINGE) ×4 IMPLANT
TOWER CARTRIDGE SMART MIX (DISPOSABLE) ×2 IMPLANT
TRAY FOLEY MTR SLVR 16FR STAT (SET/KITS/TRAYS/PACK) ×2 IMPLANT
WATER STERILE IRR 1000ML POUR (IV SOLUTION) ×4 IMPLANT
YANKAUER SUCT 12FT TUBE ARGYLE (SUCTIONS) ×2 IMPLANT

## 2020-11-16 NOTE — Anesthesia Preprocedure Evaluation (Signed)
Anesthesia Evaluation  Patient identified by MRN, date of birth, ID band Patient awake    Reviewed: Allergy & Precautions, H&P , NPO status , Patient's Chart, lab work & pertinent test results, reviewed documented beta blocker date and time   Airway Mallampati: II  TM Distance: >3 FB Neck ROM: full    Dental no notable dental hx.    Pulmonary neg pulmonary ROS, Current Smoker and Patient abstained from smoking.,    Pulmonary exam normal breath sounds clear to auscultation       Cardiovascular Exercise Tolerance: Good negative cardio ROS   Rhythm:regular Rate:Normal     Neuro/Psych  Headaches, PSYCHIATRIC DISORDERS Anxiety  Neuromuscular disease    GI/Hepatic GERD  Medicated,(+) Hepatitis -, Autoimmune, Unspecified  Endo/Other  Hypothyroidism   Renal/GU negative Renal ROS  negative genitourinary   Musculoskeletal   Abdominal   Peds  Hematology  (+) Blood dyscrasia, anemia ,   Anesthesia Other Findings   Reproductive/Obstetrics negative OB ROS                             Anesthesia Physical Anesthesia Plan  ASA: 3  Anesthesia Plan: General and General ETT   Post-op Pain Management:    Induction:   PONV Risk Score and Plan: Ondansetron  Airway Management Planned:   Additional Equipment:   Intra-op Plan:   Post-operative Plan:   Informed Consent: I have reviewed the patients History and Physical, chart, labs and discussed the procedure including the risks, benefits and alternatives for the proposed anesthesia with the patient or authorized representative who has indicated his/her understanding and acceptance.     Dental Advisory Given  Plan Discussed with: CRNA  Anesthesia Plan Comments:         Anesthesia Quick Evaluation

## 2020-11-16 NOTE — Progress Notes (Signed)
Hgb-8.6, Hct-26.0 results given to Dr Amedeo Kinsman. No new orders given.

## 2020-11-16 NOTE — Transfer of Care (Signed)
Immediate Anesthesia Transfer of Care Note  Patient: Shari Branch  Procedure(s) Performed: HARDWARE REMOVAL (Right: Hip) ARTHROPLASTY BIPOLAR HIP (HEMIARTHROPLASTY) (Right: Hip)  Patient Location: PACU  Anesthesia Type:General  Level of Consciousness: awake, alert  and sedated  Airway & Oxygen Therapy: Patient Spontanous Breathing and Patient connected to face mask oxygen  Post-op Assessment: Report given to RN, Post -op Vital signs reviewed and stable and Patient moving all extremities X 4  Post vital signs: Reviewed and stable  Last Vitals:  Vitals Value Taken Time  BP 108/57 11/16/20 1103  Temp    Pulse 116 11/16/20 1106  Resp 17 11/16/20 1106  SpO2 100 % 11/16/20 1106  Vitals shown include unvalidated device data.  Last Pain:  Vitals:   11/16/20 0708  TempSrc: Oral  PainSc: 0-No pain      Patients Stated Pain Goal: 7 ([id number])  Complications: No notable events documented.

## 2020-11-16 NOTE — Anesthesia Procedure Notes (Signed)
Procedure Name: Intubation Date/Time: 11/16/2020 7:50 AM Performed by: Lyda Jester, CRNA Pre-anesthesia Checklist: Patient identified, Patient being monitored, Timeout performed, Emergency Drugs available and Suction available Patient Re-evaluated:Patient Re-evaluated prior to induction Oxygen Delivery Method: Circle System Utilized Preoxygenation: Pre-oxygenation with 100% oxygen Induction Type: IV induction Ventilation: Mask ventilation without difficulty Laryngoscope Size: Miller and 2 Grade View: Grade I Tube type: Oral Tube size: 7.0 mm Number of attempts: 1 Airway Equipment and Method: stylet Placement Confirmation: ETT inserted through vocal cords under direct vision, positive ETCO2 and breath sounds checked- equal and bilateral Secured at: 21 cm Tube secured with: Tape Dental Injury: Teeth and Oropharynx as per pre-operative assessment

## 2020-11-16 NOTE — Interval H&P Note (Signed)
History and Physical Interval Note:  11/16/2020 7:11 AM  Shari Branch  has presented today for surgery, with the diagnosis of Right femoral neck fracture.  The various methods of treatment have been discussed with the patient and family. After consideration of risks, benefits and other options for treatment, the patient has consented to  Procedure(s) with comments: HARDWARE REMOVAL (Right) - Removal of hardware from right hip; cannulated screws (Arthrex) ARTHROPLASTY BIPOLAR HIP (HEMIARTHROPLASTY) (Right) as a surgical intervention.  The patient's history has been reviewed, patient examined, no change in status, stable for surgery.  I have reviewed the patient's chart and labs.  Questions were answered to the patient's satisfaction.     Patient had failure of fixation following pinning of right femoral neck fracture.  She has had difficulty ambulating and XR demonstrates displacement of the fracture with varus angulation.  As a result, we will plan to remove the existing hardware with transition to a cemented right hip hemiarthroplasty.    Mordecai Rasmussen

## 2020-11-16 NOTE — Anesthesia Postprocedure Evaluation (Signed)
Anesthesia Post Note  Patient: Shari Branch  Procedure(s) Performed: HARDWARE REMOVAL (Right: Hip) ARTHROPLASTY BIPOLAR HIP (HEMIARTHROPLASTY) (Right: Hip)  Patient location during evaluation: PACU Anesthesia Type: General Level of consciousness: awake and alert Pain management: pain level controlled Vital Signs Assessment: post-procedure vital signs reviewed and stable Respiratory status: spontaneous breathing, nonlabored ventilation, respiratory function stable and patient connected to nasal cannula oxygen Cardiovascular status: blood pressure returned to baseline and stable Postop Assessment: no apparent nausea or vomiting Anesthetic complications: no   No notable events documented.   Last Vitals:  Vitals:   11/16/20 0730 11/16/20 1103  BP: 109/75 (!) 108/57  Pulse: 97 (!) 117  Resp: 11 17  Temp:  37.3 C  SpO2: 94% 95%    Last Pain:  Vitals:   11/16/20 1103  TempSrc:   PainSc: 0-No pain                 Louann Sjogren

## 2020-11-16 NOTE — Addendum Note (Signed)
Addendum  created 11/16/20 1308 by Lyda Jester, CRNA   Flowsheet accepted

## 2020-11-16 NOTE — Op Note (Signed)
Orthopaedic Surgery Operative Note (CSN: [id number])  Shari Branch  [birth year] Date of Surgery: 11/16/2020   Diagnoses:  Right femoral neck fracture; failure of prior fixation  Procedure: Removal of cannulated screws from right femoral neck with revision of right femoral neck fracture to cemented hip hemiarthroplasty.    Operative Finding Successful completion of the planned procedure.   Removal of 3 cannulated screws with washers, without issues.  Revision to cemented bipolar hip hemiarthroplasty.  Right hip stable to 90 degrees of flexion and 45 degrees of internal rotation.  Easily gets to full extension.    Post-Op Diagnosis: Same Surgeons:Primary: Mordecai Rasmussen, MD Assistants: Marquita Palms Location: [facility] OR ROOM 4 Anesthesia: General with local anesthesia Antibiotics: Ancef 2 g with local vancomycin powder 1 g at the surgical site Tourniquet time: N/A Estimated Blood Loss: [id number] cc Complications: None Specimens: None Implants: Implant Name Type Inv. Item Serial No. Manufacturer Lot No. LRB No. Used Action  CEMENT HV SMART SET - [place]:[id number] Cement CEMENT HV SMART SET  [employer]PU[employer] [id number] Right 2 Implanted  HIP BALL ARTICU 28 +5 - [place]:[id number] Hips HIP BALL ARTICU 28 +5  [employer]PU[employer] ORTH[employer] [id number] Right 1 Implanted  CEMENT RESTRICTOR DEPUY SZ 4 - [place]:[id number] Cement CEMENT RESTRICTOR DEPUY SZ 4  DEPU[employer] ORTHOPA[employer] [id number] Right 1 Implanted  BIPOLAR DEPUY 47MM - [place]:[id number] Hips BIPOLAR DEPUY 47MM  [employer]PU[employer] ORTH[employer] [id number] Right 1 Implanted  STEM SUMMIT CEMENTED BASIC - [place]:[id number] Hips STEM SUMMIT CEMENTED BASIC  DEPUY ORTH[employer] [id number] Right 1 Implanted    Indications for Surgery:   Shari Branch is a 75 y.o. female who sustained a nondisplaced right femoral neck fracture and underwent percutaneous pinning.  Unfortunately she had early failure of fixation with varus displacement and persistent pain in her hip.  She was unable to ambulate effectively, and her pain was not  improving.   After discussing the radiographs and her slow recovery following surgery, she was interested in a revision surgery.  In order to restore form and function, I recommended revision to a hip hemiarthroplasty.  She was interested in proceeding.  Benefits and risks of operative and nonoperative management were discussed prior to surgery with the patient and informed consent form was completed.  Specific risks including infection, need for additional surgery, bleeding, nonunion, malunion, dislocation and more severe complications associated with anesthesia.  She elected to proceed with surgery.    Procedure:   The patient was identified properly. Informed consent was obtained and the surgical site was marked. The patient was taken to the OR where general anesthesia was induced.  The patient was positioned lateral, with the right side up.  The right leg was prepped and draped in the usual sterile fashion.  Timeout was performed before the beginning of the case.  Ancef dosing was confirmed prior to incision.  Patient received 1 g of TXA before the start of the case  The right hip was evaluated closely, and an incision was marked out.  The previous incision was well healed, but we were unable to incorporate the old incision into the new incision.    We started by making an incision centered over the greater trochanter with a scalpel. We used the scalpel to continue to dissect to the fascia.  Electrocautery was used to help with hemostasis.  A cobb elevator was used to clear the IT band and then it was pierced with Bovie electrocautery. Mayo scissors were used to cut the fascia in a longitudinal fashion. The gluteus maximus fibers  were bluntly split in line with their fibers.   The cannulated screws were easily palpable, but not readily visualized.  We used electrocautery to expose the head of each cannulated screw, and we used an osteotome to free the washers.  We started with a screwdriver on hand to  start to back the screws out.  Once they were partially removed, we switched to power and all 3 cannulated screws and washers were easily, and completely removed.  We then irrigated copiously.  There was no concern for an infection at this point.    We turned our attention to the posterior aspect of the hip.  The femur was slowly internally rotated, putting tension on the posterior structures.  Due to the partial healing of the femoral neck fracture, it was difficult to fully internally rotate. The short external rotators were identified and tagged with #1 vicryl.  Bovie electrocautery was used to dissect the short external rotators off of the insertion onto the femur.  Next, we identified the capsule and made a T capsulotomy.  The capsule was then tagged with #1 Vicryl sutures.   Next, we were able to palpate the femoral neck fracture.  We then used an osteotome to complete the partially healed fracture, and we were subsequently able to internally rotate the hip to gain greater exposure to the femoral neck.  We identified the sciatic nerve by palpation and verified that it was not in danger during the dissection.    We then made a neck cut approximately 1 cm above the lesser trochanter with a saw. We removed the femoral head and trialed off of her native anatomy. We used the box cutter to cut away some of the greater trochanter for ease of insertion of the stem. We then used the canal finder to locate the femoral canal.   Using the angle of the femoral neck as our guide for version, we broached sequentially. We trialed components and found appropriate fit and stability.  The patient would come to full extension of the hip. The hip was stable at 90 degrees of flexion, and 45 degrees of internal rotation.  Leg lengths were approximately equal.  We then removed the trials as well as the broach. We ensured there were no foreign bodies or bone within the acetabulum. We copiously irrigated the wound.  We measured  and placed an appropriately sized cement restrictor within the canal.  Cement was mixed on the back table and when ready, cement was inserted into the canal and pressurized.  We then carefully placed our stem.  Excess cement was removed immediately.  We maintained pressure on the stem while the cement hardened.  An appropriately sized head was placed on to the stem and the hip was reduced. Again, we noted it was stable in the previously mentioned manipulations.   We repaired the posterior capsule.  Short external rotators repaired to the greater trochanter to bone tunnels. We closed the fascia of the iliotibial band and gluteus maximus with running and intterupted Vicryl sutures. We then closed Scarpa's fascia with Vicryl sutures. Skin was closed with 2-0 monocryl and staples and an Aquacel dressing was placed.  A hib abduction pillow was secured.  Patient was awoken and taken to PACU in stable condition.   Post-operative plan:  The patient will be WBAT on the operative extremity. Hip abduction pillow in place at all times when in bed Posterior hip precautions PT/OT    DVT prophylaxis Aspirin 81 mg twice daily for 6  weeks.    Pain control with PRN pain medication preferring oral medicines.   Follow up plan will be scheduled in approximately 14 days for incision check and XR.

## 2020-11-17 ENCOUNTER — Encounter (HOSPITAL_COMMUNITY): Payer: Self-pay | Admitting: Orthopedic Surgery

## 2020-11-17 LAB — CBC
HCT: 23.7 % — ABNORMAL LOW (ref 36.0–46.0)
Hemoglobin: 7.6 g/dL — ABNORMAL LOW (ref 12.0–15.0)
MCH: 31.4 pg (ref 26.0–34.0)
MCHC: 32.1 g/dL (ref 30.0–36.0)
MCV: 97.9 fL (ref 80.0–100.0)
Platelets: 159 10*3/uL (ref 150–400)
RBC: 2.42 MIL/uL — ABNORMAL LOW (ref 3.87–5.11)
RDW: 13.5 % (ref 11.5–15.5)
WBC: 6.8 10*3/uL (ref 4.0–10.5)
nRBC: 0.3 % — ABNORMAL HIGH (ref 0.0–0.2)

## 2020-11-17 MED ORDER — SODIUM CHLORIDE 0.9 % IV BOLUS
500.0000 mL | Freq: Once | INTRAVENOUS | Status: AC
  Administered 2020-11-17: 500 mL via INTRAVENOUS

## 2020-11-17 NOTE — Plan of Care (Signed)

## 2020-11-17 NOTE — Evaluation (Signed)
Occupational Therapy Evaluation Patient Details Name: Shari Branch MRN: [id number] DOB: 16-Dec-1945 Today's Date: 11/17/2020   History of Present Illness Shari Branch  has presented today for surgery, with the diagnosis of Right femoral neck fracture.  The various methods of treatment have been discussed with the patient and family. After consideration of risks, benefits and other options for treatment, the patient has consented to  Procedure(s) with comments:  HARDWARE REMOVAL (Right) - Removal of hardware from right hip; cannulated screws (Arthrex)  ARTHROPLASTY BIPOLAR HIP (HEMIARTHROPLASTY) (Right) as a surgical intervention.  The patient's history has been reviewed, patient examined, no change in status, stable for surgery.  I have reviewed the patient's chart and labs.  Questions were answered to the patient's satisfaction.          Patient had failure of fixation following pinning of right femoral neck fracture.  She has had difficulty ambulating and XR demonstrates displacement of the fracture with varus angulation.  As a result, we will plan to remove the existing hardware with transition to a cemented right hip hemiarthroplasty.   Clinical Impression   Pt agreeable to OT/PT co-evaluation. Pt required min A for supine to sit bed mobility to mobilize R LE and push to sitting for supine. Pt required mod A for sit to stand and stand pivot transfer from EOB to chair. Pt demonstrates generalized B UE weakness but is able to use B UE functionally. Pt was left in chair with tray in front. Pt will benefit from continued OT in the hospital and recommended venue below to increase strength, balance, and endurance for safe ADL's.        Recommendations for follow up therapy are one component of a multi-disciplinary discharge planning process, led by the attending physician.  Recommendations may be updated based on patient status, additional functional criteria and insurance authorization.   Follow Up  Recommendations  SNF    Equipment Recommendations  None recommended by OT           Precautions / Restrictions Precautions Precautions: Fall Restrictions Weight Bearing Restrictions: Yes RLE Weight Bearing: Weight bearing as tolerated      Mobility Bed Mobility Overal bed mobility: Needs Assistance Bed Mobility: Supine to Sit     Supine to sit: Min assist     General bed mobility comments: labored movement; assist to move R LE    Transfers Overall transfer level: Needs assistance Equipment used: Rolling walker (2 wheeled) Transfers: Sit to/from [facility] Sit to Stand: Mod assist Stand pivot transfers: Mod assist       General transfer comment: using RW with somewhat labored movement    Balance Overall balance assessment: Needs assistance Sitting-balance support: No upper extremity supported;Feet supported Sitting balance-Leahy Scale: Good Sitting balance - Comments: seated at EOB   Standing balance support: Bilateral upper extremity supported;During functional activity Standing balance-Leahy Scale: Poor Standing balance comment: using RW                           ADL either performed or assessed with clinical judgement   ADL Overall ADL's : Needs assistance/impaired                         Toilet Transfer: Moderate assistance;RW;Stand-pivot Toilet Transfer Details (indicate cue type and reason): simulated via EOB to chair transfer with RW  Vision Baseline Vision/History: 1 Wears glasses Ability to See in Adequate Light: 0 Adequate Patient Visual Report: No change from baseline                  Pertinent Vitals/Pain Pain Assessment: Faces Faces Pain Scale: Hurts a little bit Pain Location: R hip Pain Descriptors / Indicators: Guarding Pain Intervention(s): Limited activity within patient's tolerance;Monitored during session;Repositioned;Premedicated before session     Hand  Dominance Right   Extremity/Trunk Assessment Upper Extremity Assessment Upper Extremity Assessment: Generalized weakness   Lower Extremity Assessment Lower Extremity Assessment: Defer to PT evaluation   Cervical / Trunk Assessment Cervical / Trunk Assessment: Normal   Communication Communication Communication: No difficulties   Cognition Arousal/Alertness: Awake/alert Behavior During Therapy: WFL for tasks assessed/performed Overall Cognitive Status: Within Functional Limits for tasks assessed                                                      Home Living Family/patient expects to be discharged to:: Private residence Living Arrangements: Spouse/significant other Available Help at Discharge: Family;Available 24 hours/day Type of Home: Mobile home Home Access: Ramped entrance     Home Layout: One level     Bathroom Shower/Tub: Occupational psychologist: Standard     Home Equipment: Environmental consultant - 2 wheels;Walker - 4 wheels;Cane - single point   Additional Comments: History taken from previous admission. Pt did report that husband is not physically able to help her much.      Prior Functioning/Environment Level of Independence: Needs assistance  Gait / Transfers Assistance Needed: Pt reports household ambulation with RW ADL's / Homemaking Assistance Needed: Pt reports assist for IADL's and independence with ADL's using AD.            OT Problem List: Decreased strength;Decreased activity tolerance;Impaired balance (sitting and/or standing)      OT Treatment/Interventions: Self-care/ADL training;Therapeutic exercise;Therapeutic activities;Patient/family education;Balance training    OT Goals(Current goals can be found in the care plan section) Acute Rehab OT Goals Patient Stated Goal: return home OT Goal Formulation: With patient Time For Goal Achievement: 12/01/20 Potential to Achieve Goals: Good  OT Frequency: Min 2X/week                Co-evaluation PT/OT/SLP Co-Evaluation/Treatment: Yes Reason for Co-Treatment: Complexity of the patient's impairments (multi-system involvement)   OT goals addressed during session: ADL's and self-care;Strengthening/ROM                       End of Session Equipment Utilized During Treatment: Rolling walker  Activity Tolerance: Patient tolerated treatment well Patient left: with call bell/phone within reach;in chair  OT Visit Diagnosis: Unsteadiness on feet (R26.81);Muscle weakness (generalized) (M62.81);History of falling (Z91.81)                Time: [id number] OT Time Calculation (min): 20 min Charges:  OT General Charges $OT Visit: 1 Visit OT Evaluation $OT Eval Low Complexity: 1 Low  Lorianna Spadaccini OT, MOT  Larey Seat 11/17/2020, 12:26 PM

## 2020-11-17 NOTE — Plan of Care (Signed)
  Problem: Acute Rehab OT Goals (only OT should resolve) Goal: Pt. Will Perform Grooming Flowsheets (Taken 11/17/2020 1228) Pt Will Perform Grooming:  with min guard assist  with min assist  standing  with adaptive equipment Goal: Pt. Will Perform Lower Body Dressing Flowsheets (Taken 11/17/2020 1228) Pt Will Perform Lower Body Dressing:  with min guard assist  with supervision  sitting/lateral leans  with adaptive equipment Goal: Pt. Will Transfer To Toilet Flowsheets (Taken 11/17/2020 1228) Pt Will Transfer to Toilet:  with min guard assist  stand pivot transfer Goal: Pt/Caregiver Will Perform Home Exercise Program Flowsheets (Taken 11/17/2020 1228) Pt/caregiver will Perform Home Exercise Program:  Increased strength  Both right and left upper extremity  Independently  Thuy Atilano OT, MOT

## 2020-11-17 NOTE — Progress Notes (Signed)
   ORTHOPAEDIC PROGRESS NOTE  s/p Procedure(s): HARDWARE REMOVAL - cannulated screws, right femoral neck;  conversion to right hip hemiarthroplasty  DOS: 11/16/20  SUBJECTIVE: No issues over night.  Pain is controlled.  No dizziness.  She is not lightheaded.  She has not worked with PT or OT yet.  OBJECTIVE: PE:  Vitals:   11/16/20 2200 11/17/20 0458  BP: 102/70 101/61  Pulse: 90 93  Resp: 18 19  Temp: 98.8 F (37.1 C) 98.4 F (36.9 C)  SpO2: 92% 92%   Alert and oriented.  No acute distress  Hip abduction pillow in place Dressing over lateral right hip is clean, dry and intact Active motion in the TA/EHL 2+ DP pulse Slight decrease sensation to dorsum of foot  CBC    Component Value Date/Time   WBC 6.8 11/17/2020 0631   RBC 2.42 (L) 11/17/2020 0631   HGB 7.6 (L) 11/17/2020 0631   HCT 23.7 (L) 11/17/2020 0631   PLT 159 11/17/2020 0631   MCV 97.9 11/17/2020 0631   MCH 31.4 11/17/2020 0631   MCHC 32.1 11/17/2020 0631   RDW 13.5 11/17/2020 0631     ASSESSMENT: Shari Branch is a 75 y.o. female doing well postoperatively.  PLAN: Weightbearing: WBAT RLE Insicional and dressing care: Dressings left intact until follow-up Orthopedic device(s):  Hip abduction pillow - use at all times when in bed. VTE prophylaxis: Aspirin '81mg'$  BID  6 weeks Pain control: PO medications as needed Follow - up plan: 2 weeks   Contact information:     Eldwin Volkov A. Amedeo Kinsman, MD [facility] [address] [place],  [place]  [address] Phone: [phone] Fax: [phone]

## 2020-11-17 NOTE — Plan of Care (Signed)
  Problem: Acute Rehab PT Goals(only PT should resolve) Goal: Pt Will Go Supine/Side To Sit Outcome: Progressing Flowsheets (Taken 11/17/2020 1401) Pt will go Supine/Side to Sit:  with min guard assist  with minimal assist Goal: Patient Will Transfer Sit To/From Stand Outcome: Progressing Flowsheets (Taken 11/17/2020 1401) Patient will transfer sit to/from stand:  with min guard assist  with minimal assist Goal: Pt Will Transfer Bed To Chair/Chair To Bed Outcome: Progressing Flowsheets (Taken 11/17/2020 1401) Pt will Transfer Bed to Chair/Chair to Bed: with min assist Goal: Pt Will Ambulate Outcome: Progressing Flowsheets (Taken 11/17/2020 1401) Pt will Ambulate:  50 feet  with minimal assist  with rolling walker   2:02 PM, 11/17/20 Lonell Grandchild, MPT Physical Therapist with [facility] [phone] office [phone] mobile phone

## 2020-11-17 NOTE — Evaluation (Signed)
Physical Therapy Evaluation Patient Details Name: Shari Branch MRN: [id number] DOB: May 17, 1945 Today's Date: 11/17/2020  History of Present Illness  Shari Branch  has presented today for surgery, with the diagnosis of Right femoral neck fracture.  The various methods of treatment have been discussed with the patient and family. After consideration of risks, benefits and other options for treatment, the patient has consented to  Procedure(s) with comments:  HARDWARE REMOVAL (Right) - Removal of hardware from right hip; cannulated screws (Arthrex)  ARTHROPLASTY BIPOLAR HIP (HEMIARTHROPLASTY) (Right) as a surgical intervention.  The patient's history has been reviewed, patient examined, no change in status, stable for surgery.  I have reviewed the patient's chart and labs.  Questions were answered to the patient's satisfaction.          Patient had failure of fixation following pinning of right femoral neck fracture.  She has had difficulty ambulating and XR demonstrates displacement of the fracture with varus angulation.  As a result, we will plan to remove the existing hardware with transition to a cemented right hip hemiarthroplasty.   Clinical Impression  Patient demonstrates slow labored movement for sitting up at bedside requiring Min assist to help move RLE and for propping up on elbows during supine to sitting, limited to a few side steps, steps forward/backwards at bedside with slow labored movement, initally c/o dizziness that resolved and limited mostly due to c/o fatigue and tolerated sitting up in chair after therapy - nursing staff notified.  Patient will benefit from continued physical therapy in hospital and recommended venue below to increase strength, balance, endurance for safe ADLs and gait.         Recommendations for follow up therapy are one component of a multi-disciplinary discharge planning process, led by the attending physician.  Recommendations may be updated based on patient  status, additional functional criteria and insurance authorization.  Follow Up Recommendations SNF    Equipment Recommendations  None recommended by PT    Recommendations for Other Services       Precautions / Restrictions Precautions Precautions: Fall;Posterior Hip Precaution Booklet Issued: No Precaution Comments: Patient instructed in posterior hip precautions with written instructions posted in room Restrictions Weight Bearing Restrictions: Yes RLE Weight Bearing: Weight bearing as tolerated      Mobility  Bed Mobility Overal bed mobility: Needs Assistance Bed Mobility: Supine to Sit     Supine to sit: Min assist     General bed mobility comments: as per OT notes    Transfers Overall transfer level: Needs assistance Equipment used: Rolling walker (2 wheeled) Transfers: Sit to/from [facility] Sit to Stand: Mod assist Stand pivot transfers: Mod assist       General transfer comment: as per OT notes  Ambulation/Gait Ambulation/Gait assistance: Mod assist Gait Distance (Feet): 7 Feet Assistive device: Rolling walker (2 wheeled) Gait Pattern/deviations: Decreased step length - right;Decreased step length - left;Decreased stride length Gait velocity: decreased   General Gait Details: limited to a few side steps, steps forward/backwards at bedside with slow labored movement, initally c/o dizziness that resolved and limited mostly due to c/o fatigue  Stairs            Wheelchair Mobility    Modified Rankin (Stroke Patients Only)       Balance Overall balance assessment: Needs assistance Sitting-balance support: Feet supported;No upper extremity supported Sitting balance-Leahy Scale: Good Sitting balance - Comments: seated at EOB   Standing balance support: Bilateral upper extremity supported;During functional activity Standing balance-Leahy  Scale: Poor Standing balance comment: fair/poor using RW                              Pertinent Vitals/Pain Pain Assessment: Faces Faces Pain Scale: Hurts a little bit Pain Location: R hip Pain Descriptors / Indicators: Guarding;Sore Pain Intervention(s): Limited activity within patient's tolerance;Monitored during session;Repositioned    Home Living Family/patient expects to be discharged to:: Private residence Living Arrangements: Spouse/significant other Available Help at Discharge: Family;Available 24 hours/day Type of Home: Mobile home Home Access: Ramped entrance     Home Layout: One level Home Equipment: [place] - 2 wheels;Walker - 4 wheels;Cane - single point Additional Comments: History taken from previous admission. Pt did report that husband is not physically able to help her much.    Prior Function Level of Independence: Needs assistance   Gait / Transfers Assistance Needed: Pt reports household ambulation with RW  ADL's / Homemaking Assistance Needed: Pt reports assist for IADL's and independence with ADL's using AD.        Hand Dominance   Dominant Hand: Right    Extremity/Trunk Assessment   Upper Extremity Assessment Upper Extremity Assessment: Defer to OT evaluation    Lower Extremity Assessment Lower Extremity Assessment: Generalized weakness;RLE deficits/detail RLE Deficits / Details: grossly -4/5 RLE: Unable to fully assess due to pain RLE Sensation: WNL RLE Coordination: WNL    Cervical / Trunk Assessment Cervical / Trunk Assessment: Normal  Communication   Communication: No difficulties  Cognition Arousal/Alertness: Awake/alert Behavior During Therapy: WFL for tasks assessed/performed Overall Cognitive Status: Within Functional Limits for tasks assessed                                        General Comments      Exercises     Assessment/Plan    PT Assessment Patient needs continued PT services  PT Problem List Decreased strength;Decreased activity tolerance;Decreased balance;Decreased  mobility       PT Treatment Interventions DME instruction;Gait training;Stair training;Functional mobility training;Therapeutic activities;Therapeutic exercise;Balance training;Patient/family education    PT Goals (Current goals can be found in the Care Plan section)  Acute Rehab PT Goals Patient Stated Goal: return home after rehab PT Goal Formulation: With patient Time For Goal Achievement: 12/01/20 Potential to Achieve Goals: Good    Frequency Min 4X/week   Barriers to discharge        Co-evaluation PT/OT/SLP Co-Evaluation/Treatment: Yes Reason for Co-Treatment: To address functional/ADL transfers;Complexity of the patient's impairments (multi-system involvement) PT goals addressed during session: Mobility/safety with mobility;Balance;Proper use of DME OT goals addressed during session: ADL's and self-care;Strengthening/ROM       AM-PAC PT "6 Clicks" Mobility  Outcome Measure Help needed turning from your back to your side while in a flat bed without using bedrails?: A Lot Help needed moving from lying on your back to sitting on the side of a flat bed without using bedrails?: A Little Help needed moving to and from a bed to a chair (including a wheelchair)?: A Lot Help needed standing up from a chair using your arms (e.g., wheelchair or bedside chair)?: A Lot Help needed to walk in hospital room?: A Lot Help needed climbing 3-5 steps with a railing? : A Lot 6 Click Score: 13    End of Session   Activity Tolerance: Patient tolerated treatment well;Patient limited by fatigue Patient  left: in chair;with call bell/phone within reach Nurse Communication: Mobility status PT Visit Diagnosis: Unsteadiness on feet (R26.81);Other abnormalities of gait and mobility (R26.89);Muscle weakness (generalized) (M62.81)    Time: [id number] PT Time Calculation (min) (ACUTE ONLY): 21 min   Charges:   PT Evaluation $PT Eval Moderate Complexity: 1 Mod PT Treatments $Therapeutic  Activity: 8-22 mins        2:00 PM, 11/17/20 Lonell Grandchild, MPT Physical Therapist with [facility] [phone] office [phone] mobile phone

## 2020-11-18 LAB — CBC
HCT: 18 % — ABNORMAL LOW (ref 36.0–46.0)
HCT: 27.2 % — ABNORMAL LOW (ref 36.0–46.0)
Hemoglobin: 5.9 g/dL — CL (ref 12.0–15.0)
Hemoglobin: 8.8 g/dL — ABNORMAL LOW (ref 12.0–15.0)
MCH: 31.6 pg (ref 26.0–34.0)
MCH: 30.6 pg (ref 26.0–34.0)
MCHC: 32.8 g/dL (ref 30.0–36.0)
MCHC: 32.4 g/dL (ref 30.0–36.0)
MCV: 96.3 fL (ref 80.0–100.0)
MCV: 94.4 fL (ref 80.0–100.0)
Platelets: 120 10*3/uL — ABNORMAL LOW (ref 150–400)
Platelets: 127 10*3/uL — ABNORMAL LOW (ref 150–400)
RBC: 1.87 MIL/uL — ABNORMAL LOW (ref 3.87–5.11)
RBC: 2.88 MIL/uL — ABNORMAL LOW (ref 3.87–5.11)
RDW: 13.7 % (ref 11.5–15.5)
RDW: 14.4 % (ref 11.5–15.5)
WBC: 4.6 10*3/uL (ref 4.0–10.5)
WBC: 4.4 10*3/uL (ref 4.0–10.5)
nRBC: 0 % (ref 0.0–0.2)
nRBC: 0 % (ref 0.0–0.2)

## 2020-11-18 LAB — BASIC METABOLIC PANEL
Anion gap: 6 (ref 5–15)
BUN: 13 mg/dL (ref 8–23)
CO2: 26 mmol/L (ref 22–32)
Calcium: 8.1 mg/dL — ABNORMAL LOW (ref 8.9–10.3)
Chloride: 100 mmol/L (ref 98–111)
Creatinine, Ser: 0.51 mg/dL (ref 0.44–1.00)
GFR, Estimated: >60 mL/min (ref >60)
Glucose, Bld: 108 mg/dL — ABNORMAL HIGH (ref 70–99)
Potassium: 3.8 mmol/L (ref 3.5–5.1)
Sodium: 132 mmol/L — ABNORMAL LOW (ref 135–145)

## 2020-11-18 LAB — PREPARE RBC (CROSSMATCH)

## 2020-11-18 MED ORDER — SODIUM CHLORIDE 0.9% IV SOLUTION: Freq: Once | INTRAVENOUS | Status: AC

## 2020-11-18 NOTE — NC FL2 (Signed)
[place] LEVEL OF CARE SCREENING TOOL     IDENTIFICATION  Patient Name: Shari Branch Birthdate: Feb 04, 1946 Sex: female Admission Date (Current Location): 11/16/2020  [facility] and [place] Number:  [employer] and Address:  [facility] [address] [place]      Provider Number: [id number]  Attending Physician Name and Address:  Mordecai Rasmussen, MD  Relative Name and Phone Number:       Current Level of Care: Hospital Recommended Level of Care: [place] Prior Approval Number:    Date Approved/Denied:   PASRR Number: [id number]  Discharge Plan: SNF    Current Diagnoses: Patient Active Problem List   Diagnosis Date Noted   Closed displaced fracture of right femoral neck ([place]) 10/06/2020   Pancytopenia ([place]) 10/06/2020   Hyponatremia 10/06/2020   Hyperglycemia 10/06/2020   Meningioma ([place]) 10/06/2020   Fall at home, initial encounter 10/06/2020   Constipation 02/17/2014   Liver hemangioma    Chest pain 10/29/2012   Bacterial conjunctivitis of left eye 09/17/2012   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   COLITIS 0000000   Nonalcoholic steatohepatitis ([place]) 09/16/2008   Tobacco use 04/28/2008   TINNITUS, LEFT 04/28/2008   HIP, ARTHRITIS, DEGEN./OSTEO 11/28/2007   GASTRITIS 02/15/2007   OSTEOPENIA 02/15/2007   Hypothyroidism 01/25/2007   Hyperlipidemia 01/25/2007   MIGRAINE HEADACHE 01/25/2007   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   CATARACTS 01/25/2007   ALLERGIC RHINITIS 01/25/2007   GERD (gastroesophageal reflux disease) 01/25/2007   MALAISE 01/25/2007   INCONTINENCE 01/25/2007    Orientation RESPIRATION BLADDER Height & Weight     Self, Time, Situation, Place  O2 (1L) Continent Weight: 166 lb 10.7 oz (75.6 kg) Height:  '5\' 7"'$  (170.2 cm)  BEHAVIORAL SYMPTOMS/MOOD NEUROLOGICAL BOWEL NUTRITION STATUS      Continent Diet  AMBULATORY STATUS COMMUNICATION OF NEEDS Skin    Extensive Assist Verbally Other (Comment) (Closed hip incision)                       Personal Care Assistance Level of Assistance  Bathing, Feeding, Dressing, Total care Bathing Assistance: Limited assistance Feeding assistance: Independent Dressing Assistance: Limited assistance Total Care Assistance: Limited assistance   Functional Limitations Info  Sight, Hearing, Speech Sight Info: Adequate Hearing Info: Adequate Speech Info: Adequate    SPECIAL CARE FACTORS FREQUENCY  PT (By licensed PT), OT (By licensed OT)     PT Frequency: 5 times weekly OT Frequency: 5 times weekly            Contractures Contractures Info: Not present    Additional Factors Info  Code Status, Allergies Code Status Info: FULL Allergies Info: Sulfonamide Derivatives           Current Medications (11/18/2020):  This is the current hospital active medication list Current Facility-Administered Medications  Medication Dose Route Frequency Provider Last Rate Last Admin   acetaminophen (TYLENOL) tablet 1,000 mg  1,000 mg Oral Q8H Larena Glassman A, MD   1,000 mg at 11/18/20 0512   amitriptyline (ELAVIL) tablet 150 mg  150 mg Oral QHS Mordecai Rasmussen, MD   150 mg at 11/17/20 2057   aspirin chewable tablet 81 mg  81 mg Oral BID Mordecai Rasmussen, MD   81 mg at 11/17/20 2058   atorvastatin (LIPITOR) tablet 20 mg  20 mg Oral Daily Mordecai Rasmussen, MD   20 mg at 11/17/20 0903   diphenhydrAMINE (BENADRYL) 12.5  MG/5ML elixir 12.5-25 mg  12.5-25 mg Oral Q4H PRN Mordecai Rasmussen, MD       docusate sodium (COLACE) capsule 100 mg  100 mg Oral BID Mordecai Rasmussen, MD   100 mg at 11/17/20 2057   escitalopram (LEXAPRO) tablet 20 mg  20 mg Oral Daily Mordecai Rasmussen, MD   20 mg at 11/17/20 [id number]   levothyroxine (SYNTHROID) tablet 112 mcg  112 mcg Oral Daily Mordecai Rasmussen, MD   112 mcg at 11/18/20 0513   metoprolol tartrate (LOPRESSOR) tablet 25 mg  25 mg Oral BID Mordecai Rasmussen, MD   25 mg at 11/17/20 2058    ondansetron (ZOFRAN) tablet 4 mg  4 mg Oral Q6H PRN Mordecai Rasmussen, MD       Or   ondansetron [facility]) injection 4 mg  4 mg Intravenous Q6H PRN Mordecai Rasmussen, MD       oxyCODONE (Oxy IR/ROXICODONE) immediate release tablet 5 mg  5 mg Oral Q4H PRN Mordecai Rasmussen, MD   5 mg at 11/17/20 2057   Or   oxyCODONE (Oxy IR/ROXICODONE) immediate release tablet 10 mg  10 mg Oral Q4H PRN Mordecai Rasmussen, MD       pantoprazole (PROTONIX) EC tablet 40 mg  40 mg Oral BID Mordecai Rasmussen, MD   40 mg at 11/17/20 2058     Discharge Medications: Please see discharge summary for a list of discharge medications.  Relevant Imaging Results:  Relevant Lab Results:   Additional Information [place] vaccines 06/11/19, 07/09/19. SSN: [id number].  Iona Beard, LCSWA

## 2020-11-18 NOTE — Plan of Care (Signed)
  Problem: Education: Goal: Knowledge of General Education information will improve Description Including pain rating scale, medication(s)/side effects and non-pharmacologic comfort measures Outcome: Progressing   Problem: Health Behavior/Discharge Planning: Goal: Ability to manage health-related needs will improve Outcome: Progressing   

## 2020-11-18 NOTE — Progress Notes (Signed)
Patient had not voided on my shift. Dr. Amedeo Kinsman had ordered a 500 ml bolus that was administered at 1637. Patient attempted to void on [facility] and was not able. Bladder scan showed around 100 mls. Another 500 ml bolus was ordered and administered. Around 0500 another attempt was made to void on [facility]. I bladder scanned  patient and it showed 498 mls. I notified Dr. Amedeo Kinsman and he order an in and out cath. 600 mls of urine came from cath. Will continue to monitor.

## 2020-11-18 NOTE — Progress Notes (Signed)
Date and time results received: 11/18/20 12:16 AM  (use smartphrase ".now" to insert current time)  Test: Hemoglobin Critical Value: 5.9  Name of Provider Notified: Dr. Larena Glassman  Orders Received? Or Actions Taken?:  awaiting orders

## 2020-11-18 NOTE — TOC Initial Note (Signed)
Transition of Care [facility]) - Initial/Assessment Note    Patient Details  Name: Shari Branch MRN: [id number] Date of Birth: 1945/12/05  Transition of Care [facility]) CM/SW Contact:    Iona Beard, [place] Phone Number: 11/18/2020, 3:17 PM  Clinical Narrative:                 [facility] consulted for SNF placement. CSW spoke with pt to confirm interest in SNF placement. Pt states that she is interested but does not want to go back to [place]. Pt is agreeable to referral being sent out to local facilities. TOC to follow for bed offers.   Expected Discharge Plan: Skilled Nursing Facility Barriers to Discharge: Continued Medical Work up   Patient Goals and CMS Choice Patient states their goals for this hospitalization and ongoing recovery are:: go to SNF CMS Medicare.gov Compare Post Acute Care list provided to:: Patient Choice offered to / list presented to : Patient  Expected Discharge Plan and Services Expected Discharge Plan: [place] In-house Referral: Clinical Social Work Discharge Planning Services: CM Consult Post Acute Care Choice: [place] Living arrangements for the past 2 months: [place]                                      Prior Living Arrangements/Services Living arrangements for the past 2 months: Single Family Home   Patient language and need for interpreter reviewed:: Yes Do you feel safe going back to the place where you live?: Yes      Need for Family Participation in Patient Care: Yes (Comment) Care giver support system in place?: Yes (comment)   Criminal Activity/Legal Involvement Pertinent to Current Situation/Hospitalization: No - Comment as needed  Activities of Daily Living Home Assistive Devices/Equipment: None ADL Screening (condition at time of admission) Patient's cognitive ability adequate to safely complete daily activities?: Yes Is the patient deaf or have difficulty hearing?: No Does the patient have  difficulty seeing, even when wearing glasses/contacts?: No Does the patient have difficulty concentrating, remembering, or making decisions?: No Patient able to express need for assistance with ADLs?: Yes Does the patient have difficulty dressing or bathing?: No Independently performs ADLs?: Yes (appropriate for developmental age) Does the patient have difficulty walking or climbing stairs?: No Weakness of Legs: Right Weakness of Arms/Hands: None  Permission Sought/Granted                  Emotional Assessment Appearance:: Appears stated age Attitude/Demeanor/Rapport: Engaged Affect (typically observed): Accepting Orientation: : Oriented to Self, Oriented to Place, Oriented to Situation, Oriented to  Time Alcohol / Substance Use: Not Applicable Psych Involvement: No (comment)  Admission diagnosis:  Closed displaced fracture of right femoral neck ([place]) [S72.001A] Patient Active Problem List   Diagnosis Date Noted   Closed displaced fracture of right femoral neck ([place]) 10/06/2020   Pancytopenia ([place]) 10/06/2020   Hyponatremia 10/06/2020   Hyperglycemia 10/06/2020   Meningioma ([place]) 10/06/2020   Fall at home, initial encounter 10/06/2020   Constipation 02/17/2014   Liver hemangioma    Chest pain 10/29/2012   Bacterial conjunctivitis of left eye 09/17/2012   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   COLITIS 0000000   Nonalcoholic steatohepatitis ([place]) 09/16/2008   Tobacco use 04/28/2008   TINNITUS, LEFT 04/28/2008   HIP, ARTHRITIS, DEGEN./OSTEO 11/28/2007   GASTRITIS 02/15/2007   OSTEOPENIA 02/15/2007   Hypothyroidism 01/25/2007  Hyperlipidemia 01/25/2007   MIGRAINE HEADACHE 01/25/2007   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 [address] [place] [address] Phone: [phone] Fax: [phone]     Social Determinants of Health (SDOH) Interventions    Readmission Risk Interventions Readmission Risk Prevention Plan 11/18/2020  Medication Screening Complete  Transportation Screening Complete  Some recent data might be hidden

## 2020-11-18 NOTE — Progress Notes (Signed)
   ORTHOPAEDIC PROGRESS NOTE  s/p Procedure(s): HARDWARE REMOVAL - cannulated screws, right femoral neck;  conversion to right hip hemiarthroplasty  DOS: 11/16/20  SUBJECTIVE: Still having problems urinating.  In and out catheter over night.  Hemoglobin low following surgery, became symptomatic throughout the day.   She received 2 U PRBC over night.  Will get follow up H&H.  Minimal pain in her hip.  Worked with PT/OT yesterday, awaiting placement.   OBJECTIVE: PE:  Vitals:   11/18/20 0405 11/18/20 0441  BP: (!) 100/53 (!) 93/48  Pulse: 96 93  Resp: 16 18  Temp: 99 F (37.2 C) 98.8 F (37.1 C)  SpO2: 95% 95%   Alert and oriented.  No acute distress.  Very sleepy this morning.   Hip abduction pillow in place Dressing over lateral right hip is clean, dry and intact.  Some bruising around the dressing.  Active motion in the TA/EHL 2+ DP pulse Slight decrease sensation to dorsum of foot  CBC Latest Ref Rng & Units 11/17/2020 11/17/2020 11/16/2020  WBC 4.0 - 10.5 K/uL 4.6 6.8 -  Hemoglobin 12.0 - 15.0 g/dL 5.9(LL) 7.6(L) 8.6(L)  Hematocrit 36.0 - 46.0 % 18.0(L) 23.7(L) 26.0(L)  Platelets 150 - 400 K/uL 120(L) 159 -       ASSESSMENT: Shari Branch is a 75 y.o. female stable postop.  She is s/p 2 U PRBC.  Issues with urinating, may have to re-insert foley catheter.  PT/OT recommending SNF placement.    PLAN: Weightbearing: WBAT RLE Insicional and dressing care: Dressings left intact until follow-up Orthopedic device(s):  Hip abduction pillow - use at all times when in bed. VTE prophylaxis: Aspirin '81mg'$  BID  6 weeks Pain control: PO medications as needed Follow - up plan: 2 weeks   Contact information:     Brihany Butch A. Amedeo Kinsman, MD [place] [address] [place],  [place]  [address] Phone: [phone] Fax: [phone]

## 2020-11-18 NOTE — Progress Notes (Signed)
Physical Therapy Note  Patient Details  Name: Shari Branch MRN: [id number] Date of Birth: 03/04/1946 Today's Date: 11/18/2020    Pt sitting up in chair; nursing had just mobilized to rest room.  Pt request to begin further ambulation tomorrow.  Teena Irani, PTA/CLT [phone]    Shari Branch B 11/18/2020, 5:36 PM

## 2020-11-19 LAB — CBC
HCT: 23.8 % — ABNORMAL LOW (ref 36.0–46.0)
Hemoglobin: 7.6 g/dL — ABNORMAL LOW (ref 12.0–15.0)
MCH: 30.8 pg (ref 26.0–34.0)
MCHC: 31.9 g/dL (ref 30.0–36.0)
MCV: 96.4 fL (ref 80.0–100.0)
Platelets: 113 10*3/uL — ABNORMAL LOW (ref 150–400)
RBC: 2.47 MIL/uL — ABNORMAL LOW (ref 3.87–5.11)
RDW: 14.3 % (ref 11.5–15.5)
WBC: 3.4 10*3/uL — ABNORMAL LOW (ref 4.0–10.5)
nRBC: 0 % (ref 0.0–0.2)

## 2020-11-19 LAB — TYPE AND SCREEN
ABO/RH(D): A POS
Antibody Screen: NEGATIVE
Unit division: 0
Unit division: 0

## 2020-11-19 LAB — BPAM RBC
Blood Product Expiration Date: 202210102359
Blood Product Expiration Date: 202210102359
ISSUE DATE / TIME: 202209140118
ISSUE DATE / TIME: 202209140420
Unit Type and Rh: 6200
Unit Type and Rh: 6200

## 2020-11-19 LAB — SARS CORONAVIRUS 2 (TAT 6-24 HRS): SARS Coronavirus 2: NEGATIVE

## 2020-11-19 MED ORDER — ONDANSETRON HCL 4 MG PO TABS: 4.0000 mg | ORAL_TABLET | Freq: Three times a day (TID) | ORAL | 0 refills | Status: AC | PRN

## 2020-11-19 MED ORDER — CHLORHEXIDINE GLUCONATE CLOTH 2 % EX PADS: 6.0000 | MEDICATED_PAD | Freq: Every day | CUTANEOUS | Status: DC

## 2020-11-19 MED ORDER — ACETAMINOPHEN 500 MG PO TABS: 1000.0000 mg | ORAL_TABLET | Freq: Three times a day (TID) | ORAL | 0 refills | Status: AC

## 2020-11-19 MED ORDER — SODIUM CHLORIDE 0.9 % IV SOLN: INTRAVENOUS | Status: DC

## 2020-11-19 MED ORDER — OXYCODONE HCL 5 MG PO TABS: 5.0000 mg | ORAL_TABLET | ORAL | 0 refills | Status: AC | PRN

## 2020-11-19 MED ORDER — ASPIRIN EC 81 MG PO TBEC: 81.0000 mg | DELAYED_RELEASE_TABLET | Freq: Two times a day (BID) | ORAL | 0 refills | Status: AC

## 2020-11-19 NOTE — Care Management Important Message (Signed)
Important Message  Patient Details  Name: Shari Branch MRN: [id number] Date of Birth: Jul 27, 1945   Medicare Important Message Given:  Yes     Tommy Medal 11/19/2020, 11:58 AM

## 2020-11-19 NOTE — Progress Notes (Signed)
   11/17/20 2240  Assess: MEWS Score  Temp 99 F (37.2 C)  BP (!) 93/45  Pulse Rate (!) 109  Resp 18  Level of Consciousness Alert  SpO2 97 %  O2 Device Nasal Cannula  O2 Flow Rate (L/min) 1 L/min  Assess: MEWS Score  MEWS Temp 0  MEWS Systolic 1  MEWS Pulse 1  MEWS RR 0  MEWS LOC 0  MEWS Score 2  MEWS Score Color Yellow  Assess: if the MEWS score is Yellow or Red  Were vital signs taken at a resting state? Yes  Focused Assessment No change from prior assessment  Early Detection of Sepsis Score *See Row Information* Low  MEWS guidelines implemented *See Row Information* Yes  Take Vital Signs  Increase Vital Sign Frequency  Yellow: Q 2hr X 2 then Q 4hr X 2, if remains yellow, continue Q 4hrs  Escalate  MEWS: Escalate Yellow: discuss with charge nurse/RN and consider discussing with provider and RRT  Notify: Charge Nurse/RN  Name of Charge Nurse/RN Notified Lawernce Keas RN  Date Charge Nurse/RN Notified 11/17/20  Time Charge Nurse/RN Notified 2234  Notify: Provider  Provider Name/Title Dr. Amedeo Kinsman  Date Provider Notified 11/17/20  Time Provider Notified 2234  Notification Type Page  Notification Reason Change in status  Provider response See new orders  Date of Provider Response 11/17/20  Time of Provider Response 2234

## 2020-11-19 NOTE — Progress Notes (Signed)
   ORTHOPAEDIC PROGRESS NOTE  s/p Procedure(s): HARDWARE REMOVAL - cannulated screws, right femoral neck;  conversion to right hip hemiarthroplasty  DOS: 11/16/20  SUBJECTIVE: Appropriate response following transfusion.  Still having issues with urination.  Foley replaced yesterday.  No pain in her right hip and groin.  No numbness or tingling.    OBJECTIVE: PE:  Vitals:   11/18/20 2105 11/19/20 0855  BP: 115/61 124/73  Pulse: (!) 104 98  Resp: 17   Temp: 98.1 F (36.7 C)   SpO2: 97%    Alert and oriented.  No acute distress.  Answering questions.   Hip abduction pillow in place Dressing over lateral right hip is clean, dry and intact.  Some bruising around the dressing.  Active motion in the TA/EHL 2+ DP pulse Intact sensation to right hip.   CBC Latest Ref Rng & Units 11/19/2020 11/18/2020 11/17/2020  WBC 4.0 - 10.5 K/uL 3.4(L) 4.4 4.6  Hemoglobin 12.0 - 15.0 g/dL 7.6(L) 8.8(L) 5.9(LL)  Hematocrit 36.0 - 46.0 % 23.8(L) 27.2(L) 18.0(L)  Platelets 150 - 400 K/uL 113(L) 127(L) 120(L)       ASSESSMENT: Catricia Branch is a 75 y.o. female stable postop.  She is s/p 2 U PRBC.  Issues with urinating, foley replaced.  PT/OT recommending SNF placement.    Acute postoperative blood loss anemia.   PLAN: Weightbearing: WBAT RLE Insicional and dressing care: Dressings left intact until follow-up Orthopedic device(s):  Hip abduction pillow - use at all times when in bed. VTE prophylaxis: Aspirin '81mg'$  BID  6 weeks Pain control: PO medications as needed Follow - up plan: 2 weeks Foley to be removed today.    Contact information:     Shari Branch A. Amedeo Kinsman, MD [place] [address] [place],  [place]  [address] Phone: [phone] Fax: [phone]

## 2020-11-19 NOTE — TOC Progression Note (Addendum)
Transition of Care [facility]) - Progression Note    Patient Details  Name: Shari Branch MRN: [id number] Date of Birth: 31-Oct-1945  Transition of Care [facility]) CM/SW Contact  Salome Arnt, [place] Phone Number: 11/19/2020, 9:23 AM  Clinical Narrative:  TOC provided bed offers to pt who chooses [facility]. Elyse Hsu at facility notified. Per SNF, pt will not have copays- pt aware. Facility requires COVID test within 48 hours of d/c. TOC will continue to follow.    Update: Per MD, anticipate d/c tomorrow. COVID test requested.   Expected Discharge Plan: [place] Barriers to Discharge: Continued Medical Work up  Expected Discharge Plan and Services Expected Discharge Plan: [place] In-house Referral: Clinical Social Work Discharge Planning Services: CM Consult Post Acute Care Choice: [place] Living arrangements for the past 2 months: Single Family Home                                       Social Determinants of Health (SDOH) Interventions    Readmission Risk Interventions Readmission Risk Prevention Plan 11/18/2020  Medication Screening Complete  Transportation Screening Complete  Some recent data might be hidden

## 2020-11-19 NOTE — Progress Notes (Signed)
Physical Therapy Treatment Patient Details Name: Shari Branch MRN: [id number] DOB: 11/20/1945 Today's Date: 11/19/2020   History of Present Illness Shari Branch  has presented today for surgery, with the diagnosis of Right femoral neck fracture.  The various methods of treatment have been discussed with the patient and family. After consideration of risks, benefits and other options for treatment, the patient has consented to  Procedure(s) with comments:  HARDWARE REMOVAL (Right) - Removal of hardware from right hip; cannulated screws (Arthrex)  ARTHROPLASTY BIPOLAR HIP (HEMIARTHROPLASTY) (Right) as a surgical intervention.  The patient's history has been reviewed, patient examined, no change in status, stable for surgery.  I have reviewed the patient's chart and labs.  Questions were answered to the patient's satisfaction.          Patient had failure of fixation following pinning of right femoral neck fracture.  She has had difficulty ambulating and XR demonstrates displacement of the fracture with varus angulation.  As a result, we will plan to remove the existing hardware with transition to a cemented right hip hemiarthroplasty.    PT Comments    Pt friendly and willing to participate with therapy today.  Pt required mod A with STS from chair, cueing for handplacement to assist to standing.  Min guard/A required with gait training.  Pt able to go forward/backward and complete sidestepping without LOB.  EOS pt was limited mainly by fatigue, did report increased hip pain with weight bearing.  EOS pt left in chair with call bell within reach and ice applied to hip for pain control, instructed to remove following 20 minute or when goes numb with verbalized understanding.     Recommendations for follow up therapy are one component of a multi-disciplinary discharge planning process, led by the attending physician.  Recommendations may be updated based on patient status, additional functional criteria  and insurance authorization.  Follow Up Recommendations  SNF     Equipment Recommendations  None recommended by PT    Recommendations for Other Services       Precautions / Restrictions Precautions Precautions: Fall;Posterior Hip Precaution Booklet Issued: No Precaution Comments: Patient instructed in posterior hip precautions with written instructions posted in room Restrictions RLE Weight Bearing: Weight bearing as tolerated     Mobility  Bed Mobility                    Transfers Overall transfer level: Modified independent Equipment used: Rolling walker (2 wheeled) Transfers: Sit to/from Stand Sit to Stand: Mod assist         General transfer comment: Cueing to push from chair to assist to standing  Ambulation/Gait Ambulation/Gait assistance: Min assist Gait Distance (Feet): 36 Feet Assistive device: Rolling walker (2 wheeled) Gait Pattern/deviations: Decreased step length - right;Decreased step length - left;Decreased stride length     General Gait Details: Min A with cueing for equal stride lenght, able to complete forward/backward and sidestep by bed then walked into hallway, no LOB, slow cadence, limited by fatigue mainly and reports increased pain with weight bearing.   Stairs             Wheelchair Mobility    Modified Rankin (Stroke Patients Only)       Balance  Cognition Arousal/Alertness: Awake/alert Behavior During Therapy: WFL for tasks assessed/performed Overall Cognitive Status: Within Functional Limits for tasks assessed                                        Exercises      General Comments        Pertinent Vitals/Pain Pain Assessment: No/denies pain Pain Score: 7  Pain Location: Rt hip, pain free sitting, increased to 7/10 during WB-ing/gait Pain Descriptors / Indicators: Guarding;Sore Pain Intervention(s): Limited activity within  patient's tolerance;Monitored during session;Repositioned;Ice applied    Home Living                      Prior Function            PT Goals (current goals can now be found in the care plan section)      Frequency    Min 4X/week      PT Plan      Co-evaluation              AM-PAC PT "6 Clicks" Mobility   Outcome Measure  Help needed turning from your back to your side while in a flat bed without using bedrails?: A Lot Help needed moving from lying on your back to sitting on the side of a flat bed without using bedrails?: A Lot Help needed moving to and from a bed to a chair (including a wheelchair)?: A Lot Help needed standing up from a chair using your arms (e.g., wheelchair or bedside chair)?: A Lot Help needed to walk in hospital room?: A Little Help needed climbing 3-5 steps with a railing? : A Lot 6 Click Score: 13    End of Session Equipment Utilized During Treatment: Gait belt Activity Tolerance: Patient tolerated treatment well;Patient limited by fatigue Patient left: in chair;with call bell/phone within reach Nurse Communication: Mobility status PT Visit Diagnosis: Unsteadiness on feet (R26.81);Other abnormalities of gait and mobility (R26.89);Muscle weakness (generalized) (M62.81)     Time: [id number] PT Time Calculation (min) (ACUTE ONLY): 17 min  Charges:  $Therapeutic Activity: 8-22 mins                     Ihor Austin, LPTA/CLT; CBIS [phone]  Aldona Lento 11/19/2020, 4:40 PM

## 2020-11-20 ENCOUNTER — Inpatient Hospital Stay (HOSPITAL_COMMUNITY): Payer: Medicare Other

## 2020-11-20 DIAGNOSIS — R338 Other retention of urine: Secondary | ICD-10-CM

## 2020-11-20 LAB — URINALYSIS, ROUTINE W REFLEX MICROSCOPIC
Bilirubin Urine: NEGATIVE
Glucose, UA: NEGATIVE mg/dL
Hgb urine dipstick: NEGATIVE
Ketones, ur: NEGATIVE mg/dL
Leukocytes,Ua: NEGATIVE
Nitrite: NEGATIVE
Protein, ur: NEGATIVE mg/dL
Specific Gravity, Urine: 1.005 (ref 1.005–1.030)
pH: 6 (ref 5.0–8.0)

## 2020-11-20 LAB — CBC WITH DIFFERENTIAL/PLATELET
Abs Immature Granulocytes: 0.02 10*3/uL (ref 0.00–0.07)
Basophils Absolute: 0 10*3/uL (ref 0.0–0.1)
Basophils Relative: 0 %
Eosinophils Absolute: 0 10*3/uL (ref 0.0–0.5)
Eosinophils Relative: 1 %
HCT: 31.9 % — ABNORMAL LOW (ref 36.0–46.0)
Hemoglobin: 10.7 g/dL — ABNORMAL LOW (ref 12.0–15.0)
Immature Granulocytes: 1 %
Lymphocytes Relative: 12 %
Lymphs Abs: 0.4 10*3/uL — ABNORMAL LOW (ref 0.7–4.0)
MCH: 31.2 pg (ref 26.0–34.0)
MCHC: 33.5 g/dL (ref 30.0–36.0)
MCV: 93 fL (ref 80.0–100.0)
Monocytes Absolute: 0.2 10*3/uL (ref 0.1–1.0)
Monocytes Relative: 5 %
Neutro Abs: 2.7 10*3/uL (ref 1.7–7.7)
Neutrophils Relative %: 81 %
Platelets: 130 10*3/uL — ABNORMAL LOW (ref 150–400)
RBC: 3.43 MIL/uL — ABNORMAL LOW (ref 3.87–5.11)
RDW: 14.6 % (ref 11.5–15.5)
WBC: 3.3 10*3/uL — ABNORMAL LOW (ref 4.0–10.5)
nRBC: 0 % (ref 0.0–0.2)

## 2020-11-20 LAB — CBC
HCT: 23.2 % — ABNORMAL LOW (ref 36.0–46.0)
Hemoglobin: 7.4 g/dL — ABNORMAL LOW (ref 12.0–15.0)
MCH: 31 pg (ref 26.0–34.0)
MCHC: 31.9 g/dL (ref 30.0–36.0)
MCV: 97.1 fL (ref 80.0–100.0)
Platelets: 121 10*3/uL — ABNORMAL LOW (ref 150–400)
RBC: 2.39 MIL/uL — ABNORMAL LOW (ref 3.87–5.11)
RDW: 13.9 % (ref 11.5–15.5)
WBC: 2.7 10*3/uL — ABNORMAL LOW (ref 4.0–10.5)
nRBC: 0 % (ref 0.0–0.2)

## 2020-11-20 LAB — PREPARE RBC (CROSSMATCH)

## 2020-11-20 MED ORDER — SODIUM CHLORIDE 0.9% IV SOLUTION: Freq: Once | INTRAVENOUS | Status: AC

## 2020-11-20 MED ORDER — TAMSULOSIN HCL 0.4 MG PO CAPS
0.4000 mg | ORAL_CAPSULE | Freq: Every day | ORAL | Status: DC
  Administered 2020-11-20 – 2020-11-22 (×3): 0.4 mg via ORAL
  Filled 2020-11-20 (×3): qty 1

## 2020-11-20 MED ORDER — CHLORHEXIDINE GLUCONATE CLOTH 2 % EX PADS
6.0000 | MEDICATED_PAD | Freq: Every day | CUTANEOUS | Status: DC
  Administered 2020-11-20 – 2020-11-23 (×4): 6 via TOPICAL

## 2020-11-20 NOTE — Consult Note (Signed)
Urology Consult  Referring physician: Dr. Aline Brochure Reason for referral: Urinary retention  Chief Complaint: Urinary retention  History of Present Illness: [name] is a 75yo wit a history of hip fracture who developed urinary retention after hip replacement. She has failed multiple voiding trials during this hospitalization. Prior to hospitalization she was having urinary incontinence requiring 6-9 pads per day which were soaked. She had a weak urinary stream. She was having nocturia 2-3x. She would intermittently strain to urinate. No hx of UTI. No hematuria or dysuria. No issues with constipation. No numbness/tingling in her fingers or toes. No other neurologic issue. Foley is currently drain clear urine.   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 Oneida Alar: poor prep, inadequate exam   COLONOSCOPY WITH ESOPHAGOGASTRODUODENOSCOPY (EGD)  2009   Dr. Oneida Alar: good prep, normal TI, normal colon, hemorrhoids, gastritis, nodule in cardia (benign)   EAR PINNA RECONSTRUCTION W/ RIB GRAFT     7 surgeries starting at age 67   ESOPHAGOGASTRODUODENOSCOPY  2008   Dr. Oneida Alar: Reflux esophagitis, large hh, gastritis.    [name] EXCISION Right 09/19/2013   Procedure: EXCISION [name] FOOT;  Surgeon: Marcheta Grammes, DPM;  Location: [facility];  Service: Podiatry;  Laterality: Right;   HARDWARE REMOVAL Right 11/16/2020   Procedure: HARDWARE REMOVAL;   Surgeon: Mordecai Rasmussen, MD;  Location: [facility];  Service: Orthopedics;  Laterality: Right;  Removal of hardware from right hip; cannulated screws (Arthrex)   HIP ARTHROPLASTY Right 11/16/2020   Procedure: ARTHROPLASTY BIPOLAR HIP (HEMIARTHROPLASTY);  Surgeon: Mordecai Rasmussen, MD;  Location: [facility];  Service: Orthopedics;  Laterality: Right;   HIP PINNING,CANNULATED Right 10/07/2020   Procedure: CANNULATED HIP PINNING;  Surgeon: Mordecai Rasmussen, MD;  Location: [facility];  Service: Orthopedics;  Laterality: Right;  CRPP of right nondisplaced femoral neck fracture   OSTECTOMY Right 09/19/2013   Procedure: OSTECTOMY;  Surgeon: Marcheta Grammes, DPM;  Location: [facility];  Service: Podiatry;  Laterality: Right;   RETINAL DETACHMENT SURGERY     bilateral   TONSILLECTOMY      Medications: I have reviewed the patient's current medications. Allergies:  Allergies  Allergen Reactions   Sulfonamide Derivatives Other (See Comments)    REACTION: Joint Pain and locking - walks like a 75 year old    Family History  Problem Relation Age of Onset   Colon cancer Paternal Grandmother 36   Pancreatic cancer Father        deceased age 66   Other Other        retinal detachment in multiple family member at early age   Social History:  reports that she has been smoking cigarettes. She has been smoking an average of 1.5 packs per day. She has never used smokeless tobacco. She reports  that she does not drink alcohol and does not use drugs.  Review of Systems  Genitourinary:  Positive for difficulty urinating, frequency and urgency.  All other systems reviewed and are negative.  Physical Exam:  Vital signs in last 24 hours: Temp:  [97.9 F (36.6 C)-98.3 F (36.8 C)] 98 F (36.7 C) (09/16 1126) Pulse Rate:  [82-96] 88 (09/16 1126) Resp:  [16-20] 16 (09/16 1126) BP: (99-115)/(56-64) 108/59 (09/16 1126) SpO2:  [85 %-99 %] 99 % (09/16 1126) Physical Exam Constitutional:      Appearance: Normal appearance.   HENT:     Head: Normocephalic and atraumatic.     Nose: Nose normal. No congestion.  Eyes:     Extraocular Movements: Extraocular movements intact.     Pupils: Pupils are equal, round, and reactive to light.  Cardiovascular:     Rate and Rhythm: Normal rate and regular rhythm.  Pulmonary:     Effort: Pulmonary effort is normal. No respiratory distress.  Abdominal:     General: Abdomen is flat. There is no distension.  Musculoskeletal:        General: Swelling present.     Cervical back: Normal range of motion and neck supple.  Skin:    General: Skin is warm and dry.  Neurological:     General: No focal deficit present.     Mental Status: She is alert and oriented to person, place, and time.  Psychiatric:        Mood and Affect: Mood normal.        Behavior: Behavior normal.        Thought Content: Thought content normal.        Judgment: Judgment normal.    Laboratory Data:  Results for orders placed or performed during the hospital encounter of 11/16/20 (from the past 72 hour(s))  CBC Once     Status: Abnormal   Collection Time: 11/17/20 11:21 PM  Result Value Ref Range   WBC 4.6 4.0 - 10.5 K/uL   RBC 1.87 (L) 3.87 - 5.11 MIL/uL   Hemoglobin 5.9 (LL) 12.0 - 15.0 g/dL    Comment: This critical result has verified and been called to BROWN,S by Lorette Ang on 09 14 2022 at [place], and has been read back.    HCT 18.0 (L) 36.0 - 46.0 %   MCV 96.3 80.0 - 100.0 fL   MCH 31.6 26.0 - 34.0 pg   MCHC 32.8 30.0 - 36.0 g/dL   RDW 13.7 11.5 - 15.5 %   Platelets 120 (L) 150 - 400 K/uL    Comment: Immature Platelet Fraction may be clinically indicated, consider ordering this additional test [id number]    nRBC 0.0 0.0 - 0.2 %    Comment: Performed at [facility], [address] [place], [place] [address]  CBC     Status: Abnormal   Collection Time: 11/18/20 10:07 AM  Result Value Ref Range   WBC 4.4 4.0 - 10.5 K/uL   RBC 2.88 (L) 3.87 - 5.11 MIL/uL   Hemoglobin 8.8 (L) 12.0 -  15.0 g/dL    Comment: POST TRANSFUSION SPECIMEN   HCT 27.2 (L) 36.0 - 46.0 %   MCV 94.4 80.0 - 100.0 fL   MCH 30.6 26.0 - 34.0 pg   MCHC 32.4 30.0 - 36.0 g/dL   RDW 14.4 11.5 - 15.5 %   Platelets 127 (L) 150 - 400 K/uL   nRBC 0.0 0.0 - 0.2 %    Comment: Performed at [facility]  [address] [place], [place] [id number]  Basic metabolic panel     Status: Abnormal   Collection Time: 11/18/20 10:07 AM  Result Value Ref Range   Sodium 132 (L) 135 - 145 mmol/L   Potassium 3.8 3.5 - 5.1 mmol/L   Chloride 100 98 - 111 mmol/L   CO2 26 22 - 32 mmol/L   Glucose, Bld 108 (H) 70 - 99 mg/dL    Comment: Glucose reference range applies only to samples taken after fasting for at least 8 hours.   BUN 13 8 - 23 mg/dL   Creatinine, Ser 0.51 0.44 - 1.00 mg/dL   Calcium 8.1 (L) 8.9 - 10.3 mg/dL   GFR, Estimated >60 >60 mL/min    Comment: (NOTE) Calculated using the CKD-EPI Creatinine Equation (2021)    Anion gap 6 5 - 15    Comment: Performed at [facility], [address], [place], [place] [address]  CBC Once     Status: Abnormal   Collection Time: 11/19/20  5:21 AM  Result Value Ref Range   WBC 3.4 (L) 4.0 - 10.5 K/uL   RBC 2.47 (L) 3.87 - 5.11 MIL/uL   Hemoglobin 7.6 (L) 12.0 - 15.0 g/dL   HCT 23.8 (L) 36.0 - 46.0 %   MCV 96.4 80.0 - 100.0 fL   MCH 30.8 26.0 - 34.0 pg   MCHC 31.9 30.0 - 36.0 g/dL   RDW 14.3 11.5 - 15.5 %   Platelets 113 (L) 150 - 400 K/uL    Comment: SPECIMEN CHECKED FOR CLOTS Immature Platelet Fraction may be clinically indicated, consider ordering this additional test [id number] REPEATED TO VERIFY PLATELET COUNT CONFIRMED BY SMEAR    nRBC 0.0 0.0 - 0.2 %    Comment: Performed at [facility], [address] [place], [place] [address]  SARS CORONAVIRUS 2 (TAT 6-24 HRS) Nasopharyngeal Nasopharyngeal Swab     Status: None   Collection Time: 11/19/20 12:48 PM   Specimen: Nasopharyngeal Swab  Result Value Ref Range   SARS Coronavirus 2 NEGATIVE NEGATIVE    Comment:  (NOTE) SARS-CoV-2 target nucleic acids are NOT DETECTED.  The SARS-CoV-2 RNA is generally detectable in upper and lower respiratory specimens during the acute phase of infection. Negative results do not preclude SARS-CoV-2 infection, do not rule out co-infections with other pathogens, and should not be used as the sole basis for treatment or other patient management decisions. Negative results must be combined with clinical observations, patient history, and epidemiological information. The expected result is Negative.  Fact Sheet for Patients: SugarRoll.be  Fact Sheet for Healthcare Providers: https://www.woods-mathews.com/  This test is not yet approved or cleared by the [place] FDA and  has been authorized for detection and/or diagnosis of SARS-CoV-2 by FDA under an Emergency Use Authorization (EUA). This EUA will remain  in effect (meaning this test can be used) for the duration of the COVID-19 declaration under Se ction 564(b)(1) of the Act, 21 U.S.C. section 360bbb-3(b)(1), unless the authorization is terminated or revoked sooner.  Performed at [facility], [place] [address] [place], [place] [address]   CBC Once     Status: Abnormal   Collection Time: 11/20/20  6:16 AM  Result Value Ref Range   WBC 2.7 (L) 4.0 - 10.5 K/uL   RBC 2.39 (L) 3.87 - 5.11 MIL/uL   Hemoglobin 7.4 (L) 12.0 - 15.0 g/dL   HCT 23.2 (L) 36.0 - 46.0 %   MCV 97.1 80.0 - 100.0 fL  MCH 31.0 26.0 - 34.0 pg   MCHC 31.9 30.0 - 36.0 g/dL   RDW 13.9 11.5 - 15.5 %   Platelets 121 (L) 150 - 400 K/uL   nRBC 0.0 0.0 - 0.2 %    Comment: Performed at [facility], [address], [place], [place] [address]  Urinalysis, Routine w reflex microscopic Urine, Catheterized     Status: Abnormal   Collection Time: 11/20/20  8:52 AM  Result Value Ref Range   Color, Urine YELLOW YELLOW   APPearance CLOUDY (A) CLEAR   Specific Gravity, Urine 1.005 1.005 - 1.030   pH  6.0 5.0 - 8.0   Glucose, UA NEGATIVE NEGATIVE mg/dL   Hgb urine dipstick NEGATIVE NEGATIVE   Bilirubin Urine NEGATIVE NEGATIVE   Ketones, ur NEGATIVE NEGATIVE mg/dL   Protein, ur NEGATIVE NEGATIVE mg/dL   Nitrite NEGATIVE NEGATIVE   Leukocytes,Ua NEGATIVE NEGATIVE    Comment: Performed at [facility], [address], [place], [place] [address]  Prepare RBC (crossmatch)     Status: None   Collection Time: 11/20/20  9:35 AM  Result Value Ref Range   Order Confirmation      ORDER PROCESSED BY BLOOD BANK Performed at [facility], [address] [place], [place] [address]   Type and screen [facility]     Status: None (Preliminary result)   Collection Time: 11/20/20  9:35 AM  Result Value Ref Range   ABO/RH(D) A POS    Antibody Screen NEG    Sample Expiration 11/23/2020,2359    Unit Number [id number]    Blood Component Type RED CELLS,LR    Unit division 00    Status of Unit ALLOCATED    Transfusion Status OK TO TRANSFUSE    Crossmatch Result Compatible    Unit Number [id number]    Blood Component Type RED CELLS,LR    Unit division 00    Status of Unit ISSUED    Transfusion Status OK TO TRANSFUSE    Crossmatch Result      Compatible Performed at [facility], [address], [place], [place] [address]    Recent Results (from the past 240 hour(s))  SARS CORONAVIRUS 2 (TAT 6-24 HRS) Nasopharyngeal Nasopharyngeal Swab     Status: None   Collection Time: 11/12/20  1:51 PM   Specimen: Nasopharyngeal Swab  Result Value Ref Range Status   SARS Coronavirus 2 NEGATIVE NEGATIVE Final    Comment: (NOTE) SARS-CoV-2 target nucleic acids are NOT DETECTED.  The SARS-CoV-2 RNA is generally detectable in upper and lower respiratory specimens during the acute phase of infection. Negative results do not preclude SARS-CoV-2 infection, do not rule out co-infections with other pathogens, and should not be used as the sole basis for treatment or other patient management  decisions. Negative results must be combined with clinical observations, patient history, and epidemiological information. The expected result is Negative.  Fact Sheet for Patients: SugarRoll.be  Fact Sheet for Healthcare Providers: https://www.woods-mathews.com/  This test is not yet approved or cleared by the [place] FDA and  has been authorized for detection and/or diagnosis of SARS-CoV-2 by FDA under an Emergency Use Authorization (EUA). This EUA will remain  in effect (meaning this test can be used) for the duration of the COVID-19 declaration under Se ction 564(b)(1) of the Act, 21 U.S.C. section 360bbb-3(b)(1), unless the authorization is terminated or revoked sooner.  Performed at [facility], [place] [address], [place], [place] [address]   SARS CORONAVIRUS 2 (TAT  6-24 HRS) Nasopharyngeal Nasopharyngeal Swab     Status: None   Collection Time: 11/19/20 12:48 PM   Specimen: Nasopharyngeal Swab  Result Value Ref Range Status   SARS Coronavirus 2 NEGATIVE NEGATIVE Final    Comment: (NOTE) SARS-CoV-2 target nucleic acids are NOT DETECTED.  The SARS-CoV-2 RNA is generally detectable in upper and lower respiratory specimens during the acute phase of infection. Negative results do not preclude SARS-CoV-2 infection, do not rule out co-infections with other pathogens, and should not be used as the sole basis for treatment or other patient management decisions. Negative results must be combined with clinical observations, patient history, and epidemiological information. The expected result is Negative.  Fact Sheet for Patients: SugarRoll.be  Fact Sheet for Healthcare Providers: https://www.woods-mathews.com/  This test is not yet approved or cleared by the [place] FDA and  has been authorized for detection and/or diagnosis of SARS-CoV-2 by FDA under an Emergency Use  Authorization (EUA). This EUA will remain  in effect (meaning this test can be used) for the duration of the COVID-19 declaration under Se ction 564(b)(1) of the Act, 21 U.S.C. section 360bbb-3(b)(1), unless the authorization is terminated or revoked sooner.  Performed at [facility], [place] [address], [place], [place] [address]    Creatinine: Recent Labs    11/18/20 1007  CREATININE 0.51   Baseline Creatinine: 0.5  Impression/Assessment:  75yo with urinary retention  Plan:  I discussed the management of urinary retention with the patient including indwelling foley versus CIC. We have elected to continue her indwelling foley. We will start flomax 0.'4mg'$  daily and she should followup with [facility] in 1-2 weeks for a voiding trial  Nicolette Bang 11/20/2020, 2:15 PM

## 2020-11-20 NOTE — Progress Notes (Signed)
Physical Therapy Treatment Patient Details Name: Shari Branch MRN: [id number] DOB: 1945/12/31 Today's Date: 11/20/2020   History of Present Illness Shari Branch  has presented today for surgery, with the diagnosis of Right femoral neck fracture.  The various methods of treatment have been discussed with the patient and family. After consideration of risks, benefits and other options for treatment, the patient has consented to  Procedure(s) with comments:  HARDWARE REMOVAL (Right) - Removal of hardware from right hip; cannulated screws (Arthrex)  ARTHROPLASTY BIPOLAR HIP (HEMIARTHROPLASTY) (Right) as a surgical intervention.  The patient's history has been reviewed, patient examined, no change in status, stable for surgery.  I have reviewed the patient's chart and labs.  Questions were answered to the patient's satisfaction.          Patient had failure of fixation following pinning of right femoral neck fracture.  She has had difficulty ambulating and XR demonstrates displacement of the fracture with varus angulation.  As a result, we will plan to remove the existing hardware with transition to a cemented right hip hemiarthroplasty.    PT Comments    Pt friendly and willing to participate with therapy.  Pt required min cueing to improve hand placement to assist with bed mobility and transfer training, slow labored movements but able to complete with supervision.  Able to increased distance with gait training, slow labored movements with good mechanics following cueing for heel to toe. EOS pt left in chair with call bell within reach, mainly limited by fatigue.  No reports of increased hip pain.    Recommendations for follow up therapy are one component of a multi-disciplinary discharge planning process, led by the attending physician.  Recommendations may be updated based on patient status, additional functional criteria and insurance authorization.  Follow Up Recommendations  SNF     Equipment  Recommendations  None recommended by PT    Recommendations for Other Services       Precautions / Restrictions Precautions Precautions: Fall;Posterior Hip Precaution Booklet Issued: No     Mobility  Bed Mobility Overal bed mobility: Modified Independent Bed Mobility: Supine to Sit     Supine to sit: Supervision     General bed mobility comments: Cueing for mechanics and to reach form bed rail to assist with supine to sit on EOB    Transfers Overall transfer level: Modified independent Equipment used: Rolling walker (2 wheeled) Transfers: Sit to/from Stand Sit to Stand: Mod assist         General transfer comment: Cueing for hand placement to assist with standing from bed  Ambulation/Gait Ambulation/Gait assistance: Min guard Gait Distance (Feet): 55 Feet Assistive device: Rolling walker (2 wheeled) Gait Pattern/deviations: Decreased step length - right;Decreased step length - left;Decreased stride length Gait velocity: decreased   General Gait Details: Min guard with cueing for heel to toe mechanics and equal stance phase, slow cadence, no LOB;  Limited by fatigue   Stairs             Wheelchair Mobility    Modified Rankin (Stroke Patients Only)       Balance                                            Cognition Arousal/Alertness: Awake/alert Behavior During Therapy: WFL for tasks assessed/performed Overall Cognitive Status: Within Functional Limits for tasks assessed  Exercises      General Comments        Pertinent Vitals/Pain Pain Assessment: 0-10 Pain Score: 3  Pain Location: Rt hip, pain free sitting, increased with weight bearing Pain Descriptors / Indicators: Guarding;Sore Pain Intervention(s): Limited activity within patient's tolerance;Monitored during session;Repositioned;Ice applied    Home Living                      Prior Function             PT Goals (current goals can now be found in the care plan section)      Frequency    Min 4X/week      PT Plan      Co-evaluation              AM-PAC PT "6 Clicks" Mobility   Outcome Measure  Help needed turning from your back to your side while in a flat bed without using bedrails?: A Little Help needed moving from lying on your back to sitting on the side of a flat bed without using bedrails?: A Little Help needed moving to and from a bed to a chair (including a wheelchair)?: A Little Help needed standing up from a chair using your arms (e.g., wheelchair or bedside chair)?: A Lot Help needed to walk in hospital room?: A Little Help needed climbing 3-5 steps with a railing? : A Lot 6 Click Score: 16    End of Session Equipment Utilized During Treatment: Gait belt Activity Tolerance: Patient tolerated treatment well;Patient limited by fatigue Patient left: in chair;with call bell/phone within reach Nurse Communication: Mobility status PT Visit Diagnosis: Unsteadiness on feet (R26.81);Other abnormalities of gait and mobility (R26.89);Muscle weakness (generalized) (M62.81)     Time: [id number] PT Time Calculation (min) (ACUTE ONLY): 20 min  Charges:  $Therapeutic Activity: 8-22 mins                     Ihor Austin, LPTA/CLT; CBIS [phone]   Aldona Lento 11/20/2020, 1:08 PM

## 2020-11-20 NOTE — TOC Progression Note (Signed)
Transition of Care [facility]) - Progression Note    Patient Details  Name: Shari Branch MRN: [id number] Date of Birth: 1945/04/28  Transition of Care [facility]) CM/SW [place], [place] Phone Number: 11/20/2020, 9:45 AM  Clinical Narrative:    CSW spoke with Dr. Aline Brochure who states pt is not ready for d/c today, possibly tomorrow. CSW spoke to Talbert Forest with [facility] to update pt is not ready today, they cannot accept weekend admissions. Pt will be able to admit to their facility if she is medically cleared for D/C. CSW updated Dr. Aline Brochure of this. TOC to follow.   Expected Discharge Plan: [place] Barriers to Discharge: Continued Medical Work up  Expected Discharge Plan and Services Expected Discharge Plan: [place] In-house Referral: Clinical Social Work Discharge Planning Services: CM Consult Post Acute Care Choice: [place] Living arrangements for the past 2 months: Single Family Home                                       Social Determinants of Health (SDOH) Interventions    Readmission Risk Interventions Readmission Risk Prevention Plan 11/18/2020  Medication Screening Complete  Transportation Screening Complete  Some recent data might be hidden

## 2020-11-20 NOTE — Progress Notes (Signed)
Patient ID: Shari Branch, female   DOB: 1946/02/18, 75 y.o.   MRN: [id number] POD 4 HWR, BIPOLAR REPLACEMENT  Postop acute blood loss anemia Postop urinary retention Hypoxia  BP (!) 99/56 (BP Location: Left Arm)   Pulse 82   Temp 98 F (36.7 C) (Oral)   Resp 20   Ht '5\' 7"'$  (1.702 m)   Wt 75.6 kg   SpO2 96%   BMI 26.10 kg/m   Shari Branch is 75 years old she had hardware removed from her right hip and a bipolar hip cemented on Monday  Postoperatively she has had urinary retention and acute blood loss anemia after 2 units of blood her hemoglobin is still 7.6  She has mild to moderate pain in her right hip expected  Her dressing is dry  She is on aspirin twice a day  O2 sat seem to drop when she is in the supine position  Postoperatively her sciatic nerve seems to be intact with normal dorsiflexion plantarflexion good pulse in the right foot  She is awake alert and oriented x3  Plan  Urology was consulted  Foley was placed  Urinary analysis and culture was performed  2 additional units of blood will be transfused  Chest x-ray will be done to check for atelectasis  Patient will not transfer to rehab today

## 2020-11-20 NOTE — Plan of Care (Signed)
  Problem: Education: Goal: Knowledge of General Education information will improve Description: Including pain rating scale, medication(s)/side effects and non-pharmacologic comfort measures Outcome: Progressing   Problem: Health Behavior/Discharge Planning: Goal: Ability to manage health-related needs will improve Outcome: Progressing   Problem: Clinical Measurements: Goal: Ability to maintain clinical measurements within normal limits will improve Outcome: Progressing Goal: Will remain free from infection Outcome: Progressing Goal: Diagnostic test results will improve Outcome: Progressing Goal: Respiratory complications will improve Outcome: Progressing Goal: Cardiovascular complication will be avoided Outcome: Progressing   Problem: Activity: Goal: Risk for activity intolerance will decrease Outcome: Progressing   Problem: Nutrition: Goal: Adequate nutrition will be maintained Outcome: Progressing   Problem: Coping: Goal: Level of anxiety will decrease Outcome: Progressing   Problem: Elimination: Goal: Will not experience complications related to bowel motility Outcome: Progressing Goal: Will not experience complications related to urinary retention Outcome: Not Progressing   Problem: Pain Managment: Goal: General experience of comfort will improve Outcome: Progressing   Problem: Safety: Goal: Ability to remain free from injury will improve Outcome: Progressing   Problem: Skin Integrity: Goal: Risk for impaired skin integrity will decrease Outcome: Progressing   Problem: Education: Goal: Verbalization of understanding the information provided (i.e., activity precautions, restrictions, etc) will improve Outcome: Progressing Goal: Individualized Educational Video(s) Outcome: Progressing   Problem: Activity: Goal: Ability to ambulate and perform ADLs will improve Outcome: Progressing   Problem: Clinical Measurements: Goal: Postoperative complications  will be avoided or minimized Outcome: Progressing   Problem: Self-Concept: Goal: Ability to maintain and perform role responsibilities to the fullest extent possible will improve Outcome: Progressing   Problem: Pain Management: Goal: Pain level will decrease Outcome: Progressing   Problem: Education: Goal: Required Educational Video(s) Outcome: Progressing   Problem: Clinical Measurements: Goal: Postoperative complications will be avoided or minimized Outcome: Progressing   Problem: Skin Integrity: Goal: Demonstration of wound healing without infection will improve Outcome: Progressing

## 2020-11-20 NOTE — Progress Notes (Signed)
Pt had not voided tonight even with IV fluids started.  Bladder scan revealed 623cc in bladder, pt stated she still didn't feel urge to urinate. Dr. Clearence Ped notified and ordered straight cath.  Output from cath was 675cc and post cath bladder scan shown 13cc residual.

## 2020-11-20 NOTE — Care Management Important Message (Signed)
Important Message  Patient Details  Name: Shari Branch MRN: [id number] Date of Birth: March 16, 1945   Medicare Important Message Given:  Yes     Tommy Medal 11/20/2020, 2:07 PM

## 2020-11-21 LAB — BASIC METABOLIC PANEL
Anion gap: <3 — ABNORMAL LOW (ref 5–15)
BUN: 11 mg/dL (ref 8–23)
CO2: 31 mmol/L (ref 22–32)
Calcium: 8.4 mg/dL — ABNORMAL LOW (ref 8.9–10.3)
Chloride: 104 mmol/L (ref 98–111)
Creatinine, Ser: 0.48 mg/dL (ref 0.44–1.00)
GFR, Estimated: >60 mL/min (ref >60)
Glucose, Bld: 101 mg/dL — ABNORMAL HIGH (ref 70–99)
Potassium: 3.8 mmol/L (ref 3.5–5.1)
Sodium: 137 mmol/L (ref 135–145)

## 2020-11-21 LAB — HEMOGLOBIN AND HEMATOCRIT, BLOOD
HCT: 30.2 % — ABNORMAL LOW (ref 36.0–46.0)
Hemoglobin: 10 g/dL — ABNORMAL LOW (ref 12.0–15.0)

## 2020-11-22 LAB — TYPE AND SCREEN
ABO/RH(D): A POS
Antibody Screen: NEGATIVE
Unit division: 0
Unit division: 0

## 2020-11-22 LAB — BPAM RBC
Blood Product Expiration Date: 202210162359
Blood Product Expiration Date: 202210192359
ISSUE DATE / TIME: 202209161102
ISSUE DATE / TIME: 202209161428
Unit Type and Rh: 6200
Unit Type and Rh: 6200

## 2020-11-22 NOTE — Progress Notes (Signed)
Patient ID: Shari Branch, female   DOB: 1945/07/17, 75 y.o.   MRN: [id number] Note for   11/21/2020  Note for 17 September patient status post hardware removal revision to bipolar for right femoral neck fracture  Hemoglobin came up to 2:10 units of blood BUN/creatinine was normal and there was no UTI chest x-ray showed some scarring and urologist has recommended keeping the Foley in and patient is started on Flomax  Patient otherwise in stable condition feeling pretty good  Patient cannot go to rehab due to facility not taking patients over the weekend plan for rehab on Monday

## 2020-11-22 NOTE — Progress Notes (Signed)
subjective: 6 Days Post-Op Procedure(s) (LRB): HARDWARE REMOVAL (Right) ARTHROPLASTY BIPOLAR HIP (HEMIARTHROPLASTY) (Right) Patient reports pain as mild.    Objective: Vital signs in last 24 hours: Temp:  [98.2 F (36.8 C)-98.9 F (37.2 C)] 98.9 F (37.2 C) (09/18 0519) Pulse Rate:  [80-91] 80 (09/18 0519) Resp:  [18] 18 (09/17 2055) BP: (119-131)/(70-76) 129/73 (09/18 0519) SpO2:  [92 %-96 %] 96 % (09/18 0519)  Intake/Output from previous day: 09/17 0701 - 09/18 0700 In: 3679.2 [P.O.:1840; I.V.:1839.2] Out: 1200 [Urine:1200] Intake/Output this shift: No intake/output data recorded.  Recent Labs    11/20/20 0616 11/20/20 1849 11/21/20 0511  HGB 7.4* 10.7* 10.0*   Recent Labs    11/20/20 0616 11/20/20 1849 11/21/20 0511  WBC 2.7* 3.3*  --   RBC 2.39* 3.43*  --   HCT 23.2* 31.9* 30.2*  PLT 121* 130*  --    Recent Labs    11/21/20 0511  NA 137  K 3.8  CL 104  CO2 31  BUN 11  CREATININE 0.48  GLUCOSE 101*  CALCIUM 8.4*   No results for input(s): LABPT, INR in the last 72 hours.  Neurologically intact Neurovascular intact Sensation intact distally Intact pulses distally Dorsiflexion/Plantar flexion intact Incision: scant drainage No cellulitis present Compartment soft   Assessment/Plan: 6 Days Post-Op Procedure(s) (LRB): HARDWARE REMOVAL (Right) ARTHROPLASTY BIPOLAR HIP (HEMIARTHROPLASTY) (Right) Plan for discharge tomorrow Discharge to SNF      [facility] 11/22/2020, 11:13 AM

## 2020-11-23 DIAGNOSIS — R5381 Other malaise: Secondary | ICD-10-CM | POA: Diagnosis not present

## 2020-11-23 DIAGNOSIS — R41841 Cognitive communication deficit: Secondary | ICD-10-CM | POA: Diagnosis not present

## 2020-11-23 DIAGNOSIS — E876 Hypokalemia: Secondary | ICD-10-CM | POA: Diagnosis not present

## 2020-11-23 DIAGNOSIS — R2681 Unsteadiness on feet: Secondary | ICD-10-CM | POA: Diagnosis not present

## 2020-11-23 DIAGNOSIS — F419 Anxiety disorder, unspecified: Secondary | ICD-10-CM | POA: Diagnosis not present

## 2020-11-23 DIAGNOSIS — S72042A Displaced fracture of base of neck of left femur, initial encounter for closed fracture: Secondary | ICD-10-CM | POA: Diagnosis not present

## 2020-11-23 DIAGNOSIS — R339 Retention of urine, unspecified: Secondary | ICD-10-CM | POA: Diagnosis not present

## 2020-11-23 DIAGNOSIS — M6281 Muscle weakness (generalized): Secondary | ICD-10-CM | POA: Diagnosis not present

## 2020-11-23 DIAGNOSIS — J90 Pleural effusion, not elsewhere classified: Secondary | ICD-10-CM | POA: Diagnosis not present

## 2020-11-23 DIAGNOSIS — K7581 Nonalcoholic steatohepatitis (NASH): Secondary | ICD-10-CM | POA: Diagnosis not present

## 2020-11-23 DIAGNOSIS — G500 Trigeminal neuralgia: Secondary | ICD-10-CM | POA: Diagnosis not present

## 2020-11-23 DIAGNOSIS — K5900 Constipation, unspecified: Secondary | ICD-10-CM | POA: Diagnosis not present

## 2020-11-23 DIAGNOSIS — J189 Pneumonia, unspecified organism: Secondary | ICD-10-CM | POA: Diagnosis not present

## 2020-11-23 DIAGNOSIS — D62 Acute posthemorrhagic anemia: Secondary | ICD-10-CM | POA: Diagnosis not present

## 2020-11-23 DIAGNOSIS — Z471 Aftercare following joint replacement surgery: Secondary | ICD-10-CM | POA: Diagnosis not present

## 2020-11-23 DIAGNOSIS — D500 Iron deficiency anemia secondary to blood loss (chronic): Secondary | ICD-10-CM | POA: Diagnosis not present

## 2020-11-23 DIAGNOSIS — K219 Gastro-esophageal reflux disease without esophagitis: Secondary | ICD-10-CM | POA: Diagnosis not present

## 2020-11-23 DIAGNOSIS — R601 Generalized edema: Secondary | ICD-10-CM | POA: Diagnosis not present

## 2020-11-23 DIAGNOSIS — R059 Cough, unspecified: Secondary | ICD-10-CM | POA: Diagnosis not present

## 2020-11-23 DIAGNOSIS — J9600 Acute respiratory failure, unspecified whether with hypoxia or hypercapnia: Secondary | ICD-10-CM | POA: Diagnosis not present

## 2020-11-23 DIAGNOSIS — R600 Localized edema: Secondary | ICD-10-CM | POA: Diagnosis not present

## 2020-11-23 DIAGNOSIS — F418 Other specified anxiety disorders: Secondary | ICD-10-CM | POA: Diagnosis not present

## 2020-11-23 DIAGNOSIS — R1310 Dysphagia, unspecified: Secondary | ICD-10-CM | POA: Diagnosis not present

## 2020-11-23 DIAGNOSIS — E785 Hyperlipidemia, unspecified: Secondary | ICD-10-CM | POA: Diagnosis not present

## 2020-11-23 DIAGNOSIS — R0602 Shortness of breath: Secondary | ICD-10-CM | POA: Diagnosis not present

## 2020-11-23 DIAGNOSIS — E039 Hypothyroidism, unspecified: Secondary | ICD-10-CM | POA: Diagnosis not present

## 2020-11-23 DIAGNOSIS — R262 Difficulty in walking, not elsewhere classified: Secondary | ICD-10-CM | POA: Diagnosis not present

## 2020-11-23 DIAGNOSIS — Z466 Encounter for fitting and adjustment of urinary device: Secondary | ICD-10-CM | POA: Diagnosis not present

## 2020-11-23 LAB — HEMOGLOBIN AND HEMATOCRIT, BLOOD
HCT: 29.4 % — ABNORMAL LOW (ref 36.0–46.0)
Hemoglobin: 9.8 g/dL — ABNORMAL LOW (ref 12.0–15.0)

## 2020-11-23 LAB — RESP PANEL BY RT-PCR (FLU A&B, COVID) ARPGX2
Influenza A by PCR: NEGATIVE
Influenza B by PCR: NEGATIVE
SARS Coronavirus 2 by RT PCR: NEGATIVE

## 2020-11-23 NOTE — Care Management Important Message (Signed)
Important Message  Patient Details  Name: Shari Branch MRN: [id number] Date of Birth: 09/11/1945   Medicare Important Message Given:  Yes     Tommy Medal 11/23/2020, 11:36 AM

## 2020-11-23 NOTE — Discharge Instructions (Signed)
Shari Branch A. Shari Kinsman, MD [facility] [address] [place],    [address] Phone: [phone] Fax: [phone]   [place] Please keep splint clean dry and intact until followup.  You may shower on Post-Op Day #2.  You must keep splint dry during this process and may find that a plastic bag taped around the leg or alternatively a towel based bath may be a better option.   If you get your splint wet or if it is damaged please contact our clinic.  EXERCISES You can bear weight as tolerated on your right leg Use hip abduction pillow at all times when in bed Posterior hip precautions at all times; avoid flexion greater than 90 degrees, do not cross right leg over the left leg Please use a walker to avoid weight bearing.    POST-OP MEDICATIONS- Multimodal approach to pain control  In general your pain will be controlled with a combination of substances.  Prescriptions unless otherwise discussed are electronically sent to your pharmacy.  This is a carefully made plan we use to minimize narcotic use.     - Acetaminophen - Non-narcotic pain medicine taken on a scheduled basis   - Oxycodone - This is a strong narcotic, to be used only on an "as needed" basis for pain.  -  Aspirin 81mg  - This medicine is used to minimize the risk of blood clots after surgery.             -          Zofran - take as needed for nausea   FOLLOW-UP If you develop a Fever (>101.5), Redness or Drainage from the surgical incision site, please call our office to arrange for an evaluation. Please call the office to schedule a follow-up appointment for your incision check if you do not already have one, 10-14 days post-operatively.   IF YOU HAVE ANY QUESTIONS, PLEASE FEEL FREE TO CALL OUR OFFICE.  HELPFUL INFORMATION  If you had a block, it will wear off between 8-24 hrs postop typically.  This is period when your pain may go from  nearly zero to the pain you would have had postop without the block.  This is an abrupt transition but nothing dangerous is happening.  You may take an extra dose of narcotic when this happens.  You should wean off your narcotic medicines as soon as you are able.  Most patients will be off or using minimal narcotics before their first postop appointment.   Elevating your leg will help with swelling and pain control.  You are encouraged to elevate your leg as much as possible in the first couple of weeks following surgery.  Imagine a drop of water on your toe, and your goal is to get that water back to your heart.  We suggest you use the pain medication the first night prior to going to bed, in order to ease any pain when the anesthesia wears off. You should avoid taking pain medications on an empty stomach as it will make you nauseous.  Do not drink alcoholic beverages or take illicit drugs when taking pain medications.  In most states it is against the law to drive while you are in a splint or sling.  And certainly against the law to drive while taking narcotics.  You may return to work/school in the next couple of days when you feel up to it.   Pain  medication may make you constipated.  Below are a few solutions to try in this order: Decrease the amount of pain medication if you aren't having pain. Drink lots of decaffeinated fluids. Drink prune juice and/or each dried prunes  If the first 3 don't work start with additional solutions Take Colace - an over-the-counter stool softener Take Senokot - an over-the-counter laxative Take Miralax - a stronger over-the-counter laxative

## 2020-11-23 NOTE — Discharge Summary (Signed)
Patient ID: Shari Branch MRN: [id number] DOB/AGE: [birth year] 76 y.o.  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  Tolerated procedure well.   She was admitted following surgery.  She worked well with PT.  She developed postop anemia, secondary to acute blood loss related to surgery.  She received a total of 4 U PRBC without issues.  Her hemoglobin stabilized in time for discharge.  She also had issues with urinary retention following surgery and ultimately failed her trial of voiding.  Urology was consulted who recommended the foley remain in place and she start taking flomax.  She was evaluated on the day of discharge and deemed stable enough for discharge to a SNF.  She will DC with a foley in place   Consults: urology  Significant Diagnostic Studies: No additional pertinent studies.  Treatments: Surgery  Discharge Exam:  Today's Vitals   11/22/20 2026 11/22/20 2052 11/22/20 2156 11/23/20 0456  BP: (!) 162/93   (!) 114/52  Pulse: (!) 107   95  Resp: 19     Temp: 98.4 F (36.9 C)   99.1 F (37.3 C)  TempSrc: Oral   Oral  SpO2: 98%   93%  Weight:      Height:      PainSc:  8  0-No pain    Body mass index is 26.1  kg/m.   Alert and oriented.  No acute distress.     Hip abduction pillow in place Dressing over lateral right hip is clean, dry and intact.  Some bruising around the dressing.  Active motion in the TA/EHL 2+ DP pulse Sensation is intact to dorsum of foot  Disposition: Discharge disposition: 03-Skilled Nursing Facility       Allergies as of 11/23/2020       Reactions   Sulfonamide Derivatives Other (See Comments)   REACTION: Joint Pain and locking - walks like a 75 year old        Medication List     STOP taking these medications    HYDROcodone-acetaminophen 5-325 MG tablet Commonly known as: NORCO/VICODIN   naproxen 500 MG tablet Commonly known as: NAPROSYN       TAKE these medications    acetaminophen 500 MG tablet Commonly known as: TYLENOL Take 2 tablets (1,000 mg total) by mouth every 8 (eight) hours for 14 days.   amitriptyline 50 MG tablet Commonly known as: ELAVIL TAKE 3 TABLETS BY MOUTH EVERY DAY AT BEDTIME   aspirin EC 81 MG tablet Take 1 tablet (81 mg total) by mouth in the morning and at bedtime. Swallow whole.   atorvastatin 20 MG tablet Commonly known as: LIPITOR Take 1 tablet (20 mg total) by mouth daily.   enoxaparin 40 MG/0.4ML  injection Commonly known as: LOVENOX Inject 0.4 mLs (40 mg total) into the skin daily for 10 days.   escitalopram 20 MG tablet Commonly known as: LEXAPRO Take 1 tablet by mouth once daily   Euthyrox 112 MCG tablet Generic drug: levothyroxine Take 1 tablet by mouth once daily   folic acid 1 MG tablet Commonly known as: FOLVITE Take 1 tablet (1 mg total) by mouth daily.   furosemide 20 MG tablet Commonly known as: LASIX Take 1 tablet (20 mg total) by mouth daily as needed (leg swelling).   magnesium oxide 400 (240 Mg) MG tablet Commonly known as: MAG-OX Take 1 tablet (400 mg total) by mouth 2 (two) times daily.   metoprolol tartrate 25 MG tablet Commonly known as: LOPRESSOR Take 1 tablet (25 mg  total) by mouth 2 (two) times daily.   ondansetron 4 MG tablet Commonly known as: Zofran Take 1 tablet (4 mg total) by mouth every 8 (eight) hours as needed for up to 14 days for nausea or vomiting.   oxyCODONE 5 MG immediate release tablet Commonly known as: Roxicodone Take 1 tablet (5 mg total) by mouth every 4 (four) hours as needed for up to 7 days.   pantoprazole 40 MG tablet Commonly known as: PROTONIX Take 1 tablet by mouth twice daily   polyethylene glycol 17 g packet Commonly known as: MIRALAX / GLYCOLAX Take 17 g by mouth daily.   senna-docusate 8.6-50 MG tablet Commonly known as: Senokot-S Take 2 tablets by mouth 2 (two) times daily.   vitamin B-12 1000 MCG tablet Commonly known as: CYANOCOBALAMIN Take 1 tablet (1,000 mcg total) by mouth daily.        Contact information for follow-up providers     Mordecai Rasmussen, MD Follow up.   Specialties: Orthopedic Surgery, Sports Medicine Why: 7-10 days after DC for staple removal.  Staples can be removed in facility if there are no issues.  Please contact the clinic to update the plan Contact information: [place]. [address] [place] [address] [phone]              Contact information for after-discharge care     Destination     [facility] .   Service: Skilled Nursing Contact information: [address] [place] [phone]

## 2020-11-23 NOTE — Progress Notes (Signed)
Report called to [facility]. Spoke to Nurse joy and gave report on pt, no further questions at this time. Awaiting arrival of transportation at this time.

## 2020-11-23 NOTE — Progress Notes (Signed)
Changed dressing per MD per MD order. I placed a Mepilex 4 x 12in. Staples are intact, no swelling, redness nor new drainage noted when removing old dressing, pt tolerated dressing change well. MD made aware.

## 2020-11-23 NOTE — TOC Transition Note (Signed)
Transition of Care [facility]) - CM/SW Discharge Note   Patient Details  Name: Shari Branch MRN: [id number] Date of Birth: [birth year]  Transition of Care [facility]) CM/SW Contact:  Iona Beard, [place] Phone Number: 11/23/2020, 1:06 PM   Clinical Narrative:    CSW spoke with Elyse Hsu at [facility] who states pt can arrive today for SNF. Pt needs an updated COVID, that was resulted negative. CSW sent d/c summary in the hub. CSW provided RN with room number and number for report. Rn to set up wheelchair transport through [employer] when they have pt ready. TOC signing off.   Final next level of care: Skilled Nursing Facility Barriers to Discharge: Barriers Resolved   Patient Goals and CMS Choice Patient states their goals for this hospitalization and ongoing recovery are:: go to SNF CMS Medicare.gov Compare Post Acute Care list provided to:: Patient Choice offered to / list presented to : Patient  Discharge Placement              Patient chooses bed at: Other - please specify in the comment section below: ([facility]) Patient to be transferred to facility by: [facility] Name of family member notified: Jessy Oto (spouse) Patient and family notified of of transfer: 11/23/20  Discharge Plan and Services In-house Referral: Clinical Social Work Discharge Planning Services: CM Consult Post Acute Care Choice: [facility]                               Social Determinants of Health (SDOH) Interventions     Readmission Risk Interventions Readmission Risk Prevention Plan 11/18/2020  Medication Screening Complete  Transportation Screening Complete  Some recent data might be hidden

## 2020-11-23 NOTE — Progress Notes (Signed)
Physical Therapy Treatment Patient Details Name: Shari Branch MRN: [id number] DOB: 1945-04-04 Today's Date: 11/23/2020   History of Present Illness Shari Branch  has presented today for surgery, with the diagnosis of Right femoral neck fracture.  The various methods of treatment have been discussed with the patient and family. After consideration of risks, benefits and other options for treatment, the patient has consented to  Procedure(s) with comments:  HARDWARE REMOVAL (Right) - Removal of hardware from right hip; cannulated screws ([facility])  ARTHROPLASTY BIPOLAR HIP (HEMIARTHROPLASTY) (Right) as a surgical intervention.  The patient's history has been reviewed, patient examined, no change in status, stable for surgery.  I have reviewed the patient's chart and labs.  Questions were answered to the patient's satisfaction.          Patient had failure of fixation following pinning of right femoral neck fracture.  She has had difficulty ambulating and XR demonstrates displacement of the fracture with varus angulation.  As a result, we will plan to remove the existing hardware with transition to a cemented right hip hemiarthroplasty.    PT Comments    Patient demonstrates slow labored movement for sitting up at bedside requiring Min assist for moving RLE and pulling self to sitting.  Patient demonstrates fair/good return for right heel to toe stepping and slightly increased endurance/distance for gait training without loss of balance.  Patient tolerated sitting up in chair after therapy - nursing staff notified.  Patient will benefit from continued physical therapy in hospital and recommended venue below to increase strength, balance, endurance for safe ADLs and gait.      Recommendations for follow up therapy are one component of a multi-disciplinary discharge planning process, led by the attending physician.  Recommendations may be updated based on patient status, additional functional criteria and  insurance authorization.  Follow Up Recommendations  SNF     Equipment Recommendations  None recommended by PT    Recommendations for Other Services       Precautions / Restrictions Precautions Precautions: Fall;Posterior Hip Precaution Comments: Patient instructed in posterior hip precautions with written instructions posted in room Restrictions Weight Bearing Restrictions: Yes RLE Weight Bearing: Weight bearing as tolerated     Mobility  Bed Mobility Overal bed mobility: Needs Assistance Bed Mobility: Supine to Sit     Supine to sit: Min assist     General bed mobility comments: increase time, labored movement, required Min assist to pull self to sitting    Transfers Overall transfer level: Needs assistance Equipment used: Rolling walker (2 wheeled) Transfers: Sit to/from [facility] Sit to Stand: Mod assist Stand pivot transfers: Min assist;Mod assist       General transfer comment: required verbal/tactile cueing for proper hand placement during sit to stands, and fair/poor carryover for reaching for armrest during stand to sits  Ambulation/Gait Ambulation/Gait assistance: Min guard Gait Distance (Feet): 60 Feet Assistive device: Rolling walker (2 wheeled) Gait Pattern/deviations: Decreased step length - right;Decreased step length - left;Decreased stride length Gait velocity: decreased   General Gait Details: slow labored cadence with fair/good return for right heel to toe stepping without loss of balance, limited mostly due to c/o fatigue   Stairs             Wheelchair Mobility    Modified Rankin (Stroke Patients Only)       Balance Overall balance assessment: Needs assistance Sitting-balance support: Feet supported;No upper extremity supported Sitting balance-Leahy Scale: Good Sitting balance - Comments: seated at EOB  Standing balance support: Bilateral upper extremity supported;During functional activity Standing  balance-Leahy Scale: Fair Standing balance comment: using RW                            Cognition Arousal/Alertness: Awake/alert Behavior During Therapy: WFL for tasks assessed/performed Overall Cognitive Status: Within Functional Limits for tasks assessed                                        Exercises Total Joint Exercises Ankle Circles/Pumps: Supine;10 reps;Both;AROM;Strengthening Quad Sets: Supine;10 reps;Right;Strengthening;AROM Short Arc Quad: AROM;Strengthening;Right;10 reps;Supine Heel Slides: Supine;10 reps;Right;Strengthening;AROM    General Comments        Pertinent Vitals/Pain Pain Assessment: Faces Faces Pain Scale: Hurts little more Pain Location: right hip and knee Pain Descriptors / Indicators: Sore;Discomfort Pain Intervention(s): Limited activity within patient's tolerance;Monitored during session;Repositioned    Home Living                      Prior Function            PT Goals (current goals can now be found in the care plan section) Acute Rehab PT Goals Patient Stated Goal: return home after rehab PT Goal Formulation: With patient Time For Goal Achievement: 12/01/20 Potential to Achieve Goals: Good Progress towards PT goals: Progressing toward goals    Frequency    Min 4X/week      PT Plan Current plan remains appropriate    Co-evaluation              AM-PAC PT "6 Clicks" Mobility   Outcome Measure  Help needed turning from your back to your side while in a flat bed without using bedrails?: A Little Help needed moving from lying on your back to sitting on the side of a flat bed without using bedrails?: A Lot Help needed moving to and from a bed to a chair (including a wheelchair)?: A Little Help needed standing up from a chair using your arms (e.g., wheelchair or bedside chair)?: A Lot Help needed to walk in hospital room?: A Little Help needed climbing 3-5 steps with a railing? : A Lot 6  Click Score: 15    End of Session   Activity Tolerance: Patient tolerated treatment well;Patient limited by fatigue Patient left: in chair;with call bell/phone within reach Nurse Communication: Mobility status PT Visit Diagnosis: Unsteadiness on feet (R26.81);Other abnormalities of gait and mobility (R26.89);Muscle weakness (generalized) (M62.81)     Time: 3335-4562 PT Time Calculation (min) (ACUTE ONLY): 26 min  Charges:  $Gait Training: 8-22 mins $Therapeutic Exercise: 8-22 mins                     12:23 PM, 11/23/20 Lonell Grandchild, MPT Physical Therapist with [facility] [phone] office [phone] mobile phone

## 2020-11-24 ENCOUNTER — Telehealth: Payer: Self-pay | Admitting: Orthopedic Surgery

## 2020-11-24 NOTE — Telephone Encounter (Signed)
Call / message received from [facility] physical therapist, Lynann Bologna [phone], asking for weight-bearing status on this patient; please advise.

## 2020-11-25 DIAGNOSIS — K5900 Constipation, unspecified: Secondary | ICD-10-CM | POA: Diagnosis not present

## 2020-11-25 DIAGNOSIS — R339 Retention of urine, unspecified: Secondary | ICD-10-CM | POA: Diagnosis not present

## 2020-11-25 DIAGNOSIS — E039 Hypothyroidism, unspecified: Secondary | ICD-10-CM | POA: Diagnosis not present

## 2020-11-25 DIAGNOSIS — D62 Acute posthemorrhagic anemia: Secondary | ICD-10-CM | POA: Diagnosis not present

## 2020-11-25 DIAGNOSIS — K219 Gastro-esophageal reflux disease without esophagitis: Secondary | ICD-10-CM | POA: Diagnosis not present

## 2020-11-25 DIAGNOSIS — R5381 Other malaise: Secondary | ICD-10-CM | POA: Diagnosis not present

## 2020-11-25 NOTE — Telephone Encounter (Signed)
I called, that number is not working.   I called main number to [facility] and asked for [name], no [name] there, but Estill Bamberg is the PT.  I asked for Estill Bamberg to call me back so we can advise restrictions.   I also LM for Amber to call the office to sched an appt for patient next week with Dr Amedeo Kinsman.

## 2020-11-26 NOTE — Telephone Encounter (Signed)
Call received back from [name], physical therapist with facility - I was unable to reach when calling back [phone] - refer to [facility] responses.(Note: [facility] facility ph# [phone])

## 2020-11-26 NOTE — Telephone Encounter (Signed)
I called again to [phone], got a voice mail, LM for [name] to call me back to discuss the below.

## 2020-11-27 NOTE — Telephone Encounter (Signed)
This is done I called Shari Branch with the instructions and she voiced understanding States scheduling will call to make appointment for next week / through facility at [facility] side.

## 2020-12-02 ENCOUNTER — Telehealth: Payer: Self-pay | Admitting: Orthopedic Surgery

## 2020-12-02 ENCOUNTER — Encounter: Payer: Medicare Other | Admitting: Orthopedic Surgery

## 2020-12-02 DIAGNOSIS — E785 Hyperlipidemia, unspecified: Secondary | ICD-10-CM | POA: Diagnosis not present

## 2020-12-02 DIAGNOSIS — R339 Retention of urine, unspecified: Secondary | ICD-10-CM | POA: Diagnosis not present

## 2020-12-02 DIAGNOSIS — R600 Localized edema: Secondary | ICD-10-CM | POA: Diagnosis not present

## 2020-12-02 DIAGNOSIS — D62 Acute posthemorrhagic anemia: Secondary | ICD-10-CM | POA: Diagnosis not present

## 2020-12-02 DIAGNOSIS — J9600 Acute respiratory failure, unspecified whether with hypoxia or hypercapnia: Secondary | ICD-10-CM | POA: Diagnosis not present

## 2020-12-02 DIAGNOSIS — K219 Gastro-esophageal reflux disease without esophagitis: Secondary | ICD-10-CM | POA: Diagnosis not present

## 2020-12-02 DIAGNOSIS — E039 Hypothyroidism, unspecified: Secondary | ICD-10-CM | POA: Diagnosis not present

## 2020-12-02 DIAGNOSIS — K5900 Constipation, unspecified: Secondary | ICD-10-CM | POA: Diagnosis not present

## 2020-12-02 DIAGNOSIS — R5381 Other malaise: Secondary | ICD-10-CM | POA: Diagnosis not present

## 2020-12-02 DIAGNOSIS — R0602 Shortness of breath: Secondary | ICD-10-CM | POA: Diagnosis not present

## 2020-12-02 NOTE — Telephone Encounter (Signed)
I called Shari Branch and advised, they will remove sutures, then call us to sched an appt in 2-3 weeks.

## 2020-12-02 NOTE — Telephone Encounter (Signed)
Per nurse Joy at [facility] facility - [phone] - relays patient is unable to come to appointment today, due to having respiratory issues. Today's visit was to include removal of sutures/staples - please advise.

## 2020-12-03 DIAGNOSIS — R0602 Shortness of breath: Secondary | ICD-10-CM | POA: Diagnosis not present

## 2020-12-03 DIAGNOSIS — R600 Localized edema: Secondary | ICD-10-CM | POA: Diagnosis not present

## 2020-12-03 DIAGNOSIS — K5900 Constipation, unspecified: Secondary | ICD-10-CM | POA: Diagnosis not present

## 2020-12-03 DIAGNOSIS — D62 Acute posthemorrhagic anemia: Secondary | ICD-10-CM | POA: Diagnosis not present

## 2020-12-03 DIAGNOSIS — R5381 Other malaise: Secondary | ICD-10-CM | POA: Diagnosis not present

## 2020-12-03 DIAGNOSIS — K219 Gastro-esophageal reflux disease without esophagitis: Secondary | ICD-10-CM | POA: Diagnosis not present

## 2020-12-03 DIAGNOSIS — R339 Retention of urine, unspecified: Secondary | ICD-10-CM | POA: Diagnosis not present

## 2020-12-03 DIAGNOSIS — J9600 Acute respiratory failure, unspecified whether with hypoxia or hypercapnia: Secondary | ICD-10-CM | POA: Diagnosis not present

## 2020-12-09 DIAGNOSIS — J90 Pleural effusion, not elsewhere classified: Secondary | ICD-10-CM | POA: Diagnosis not present

## 2020-12-09 DIAGNOSIS — E039 Hypothyroidism, unspecified: Secondary | ICD-10-CM | POA: Diagnosis not present

## 2020-12-09 DIAGNOSIS — F418 Other specified anxiety disorders: Secondary | ICD-10-CM | POA: Diagnosis not present

## 2020-12-09 DIAGNOSIS — R5381 Other malaise: Secondary | ICD-10-CM | POA: Diagnosis not present

## 2020-12-09 DIAGNOSIS — R339 Retention of urine, unspecified: Secondary | ICD-10-CM | POA: Diagnosis not present

## 2020-12-09 DIAGNOSIS — E876 Hypokalemia: Secondary | ICD-10-CM | POA: Diagnosis not present

## 2020-12-09 DIAGNOSIS — R600 Localized edema: Secondary | ICD-10-CM | POA: Diagnosis not present

## 2020-12-09 DIAGNOSIS — D62 Acute posthemorrhagic anemia: Secondary | ICD-10-CM | POA: Diagnosis not present

## 2020-12-09 DIAGNOSIS — E785 Hyperlipidemia, unspecified: Secondary | ICD-10-CM | POA: Diagnosis not present

## 2020-12-09 DIAGNOSIS — J189 Pneumonia, unspecified organism: Secondary | ICD-10-CM | POA: Diagnosis not present

## 2020-12-16 DIAGNOSIS — R600 Localized edema: Secondary | ICD-10-CM | POA: Diagnosis not present

## 2020-12-16 DIAGNOSIS — E039 Hypothyroidism, unspecified: Secondary | ICD-10-CM | POA: Diagnosis not present

## 2020-12-16 DIAGNOSIS — R339 Retention of urine, unspecified: Secondary | ICD-10-CM | POA: Diagnosis not present

## 2020-12-16 DIAGNOSIS — J189 Pneumonia, unspecified organism: Secondary | ICD-10-CM | POA: Diagnosis not present

## 2020-12-16 DIAGNOSIS — K219 Gastro-esophageal reflux disease without esophagitis: Secondary | ICD-10-CM | POA: Diagnosis not present

## 2020-12-16 DIAGNOSIS — K5900 Constipation, unspecified: Secondary | ICD-10-CM | POA: Diagnosis not present

## 2020-12-16 DIAGNOSIS — D62 Acute posthemorrhagic anemia: Secondary | ICD-10-CM | POA: Diagnosis not present

## 2020-12-16 DIAGNOSIS — E876 Hypokalemia: Secondary | ICD-10-CM | POA: Diagnosis not present

## 2020-12-16 DIAGNOSIS — R5381 Other malaise: Secondary | ICD-10-CM | POA: Diagnosis not present

## 2020-12-16 DIAGNOSIS — J90 Pleural effusion, not elsewhere classified: Secondary | ICD-10-CM | POA: Diagnosis not present

## 2020-12-16 DIAGNOSIS — E785 Hyperlipidemia, unspecified: Secondary | ICD-10-CM | POA: Diagnosis not present

## 2020-12-30 ENCOUNTER — Telehealth: Payer: Self-pay

## 2020-12-30 NOTE — Telephone Encounter (Signed)
Attempted to call pt and didn't hear anything after phone was answered, tried to call a second time and signal was very weak/unable to hear person on other end. Tried to call facility in system but pt has been discharged.

## 2021-01-06 NOTE — Telephone Encounter (Signed)
Called to see if we can get pt in for a f/u appointment. Was told by pt husband that they are out of state due to a recent death in the family and will return tomorrow. Asked to call then to see if we can get her scheduled.

## 2021-01-07 ENCOUNTER — Telehealth: Payer: Self-pay | Admitting: Family Medicine

## 2021-01-07 NOTE — Telephone Encounter (Signed)
I attempted to leave  message for patient to call back and schedule Medicare Annual Wellness Visit (AWV) in office. No voice mail.  If not able to come in office, please offer to do virtually or by telephone.  Left office number and my jabber #[phone].  Due for AWVI   Please schedule at anytime with Nurse Health Advisor.   

## 2021-01-11 ENCOUNTER — Telehealth: Payer: Self-pay | Admitting: Orthopedic Surgery

## 2021-01-11 ENCOUNTER — Encounter: Payer: Self-pay | Admitting: Orthopedic Surgery

## 2021-01-11 NOTE — Telephone Encounter (Signed)
Called patient at her ph# on file [phone]) as well as alternate [phone]; also tried [facility] facility, however, was told that she has been discharged from facility "a little bit ago."  Patient's phone numbers do not seem to be in service as auto messages 'unable to take call at this time' - unable to leave message.   Sent letter to offer appointment.

## 2021-01-21 ENCOUNTER — Telehealth: Payer: Self-pay | Admitting: Orthopedic Surgery

## 2021-01-21 ENCOUNTER — Other Ambulatory Visit: Payer: Self-pay | Admitting: Family Medicine

## 2021-01-21 NOTE — Telephone Encounter (Signed)
Call received from patient via voice message - asked to please call [phone]

## 2021-01-21 NOTE — Telephone Encounter (Signed)
Spoke with pt, needed a post op appointment. Appointment booked.

## 2021-01-22 ENCOUNTER — Ambulatory Visit (INDEPENDENT_AMBULATORY_CARE_PROVIDER_SITE_OTHER): Payer: Medicare Other | Admitting: Orthopedic Surgery

## 2021-01-22 ENCOUNTER — Other Ambulatory Visit: Payer: Self-pay

## 2021-01-22 ENCOUNTER — Encounter: Payer: Self-pay | Admitting: Orthopedic Surgery

## 2021-01-22 ENCOUNTER — Ambulatory Visit: Payer: Medicare Other

## 2021-01-22 VITALS — Ht 67.0 in | Wt 166.0 lb

## 2021-01-22 DIAGNOSIS — S72001A Fracture of unspecified part of neck of right femur, initial encounter for closed fracture: Secondary | ICD-10-CM | POA: Diagnosis not present

## 2021-01-22 NOTE — Patient Instructions (Signed)
OK to transition to use of a cane.  Please do so carefully  Walking is excellent exercise and rehabilitation following your surgery.   Follow up in 6 weeks.

## 2021-01-22 NOTE — Progress Notes (Signed)
Orthopaedic Postop Note  Assessment: Shari Branch is a 75 y.o. female s/p CRPP of right nondisplaced femoral neck fracture (10/07/20); revision to a right hip hemiarthroplasty 11/16/20   Plan: Reviewed with the patient, which demonstrates stable positioning of the right hip hemiarthroplasty.  Continue to ambulate as part of her therapy.  Okay to transition to a cane.  I have advised her to do this gradually, and carefully.  Continue with medications as needed.  Discussed posterior hip precautions, and recommended she continue to abide by these.  We will see her back in 6 weeks for repeat evaluation.    Follow-up: Return in about 6 weeks (around 03/05/2021). XR at next visit: AP pelvis, right hip  Subjective:  Chief Complaint  Patient presents with   Routine Post Op    Rt femoral fx DOS 11/16/20     History of Present Illness: Shari Branch is a 75 y.o. female who presents following the above stated procedure.  Surgery was approximately 2 months ago.  Unfortunately, she has had a death in her family, and has been traveling out of town.  She is doing well overall.  She is ambulating well with the assistance of a walker.  She feels confident that she can start using a cane.  She is not taking any medications on a consistent basis.  No issues with her surgical incisions.  She denies numbness and tingling.  Review of Systems: No fevers or chills No numbness or tingling No Chest Pain No shortness of breath   Objective: Ht 5\' 7"  (1.702 m)   Wt 166 lb (75.3 kg)   BMI 26.00 kg/m   Physical Exam:  Elderly female.  No acute distress.  Alert and oriented.  Steady gait with the assistance of a walker.  Evaluation of the right lateral hip demonstrates a well-healed surgical incision.  No tenderness to palpation.  No surrounding erythema or drainage.  Initial surgical incision is also healing well.  She tolerates gentle range of motion of the right hip.  No pain with axial loading.  She  is able to maintain a straight leg raise.  Active motion is intact in the EHL/TA.  Sensation is intact over the dorsum of her foot.  2+ DP pulse.   IMAGING: I personally ordered and reviewed the following images:  X-rays of the right hip were obtained in clinic today and demonstrates a cemented right hip hemiarthroplasty.  This is in good position.  No evidence of hardware failure or loosening.  There is been no subsidence of the implants.  There are no visible lucencies surrounding the cement mantle, or the hardware.  Right hip is reduced.  Impression: Right hip hemiarthroplasty in good position, without hardware failure or subsidence  Mordecai Rasmussen, MD 01/22/2021 6:03 PM

## 2021-01-24 DIAGNOSIS — K5900 Constipation, unspecified: Secondary | ICD-10-CM | POA: Diagnosis not present

## 2021-01-24 DIAGNOSIS — R1310 Dysphagia, unspecified: Secondary | ICD-10-CM | POA: Diagnosis not present

## 2021-01-24 DIAGNOSIS — D1809 Hemangioma of other sites: Secondary | ICD-10-CM | POA: Diagnosis not present

## 2021-01-24 DIAGNOSIS — R5383 Other fatigue: Secondary | ICD-10-CM | POA: Diagnosis not present

## 2021-01-24 DIAGNOSIS — D500 Iron deficiency anemia secondary to blood loss (chronic): Secondary | ICD-10-CM | POA: Diagnosis not present

## 2021-01-24 DIAGNOSIS — R600 Localized edema: Secondary | ICD-10-CM | POA: Insufficient documentation

## 2021-01-24 DIAGNOSIS — S72001D Fracture of unspecified part of neck of right femur, subsequent encounter for closed fracture with routine healing: Secondary | ICD-10-CM | POA: Diagnosis not present

## 2021-01-24 DIAGNOSIS — K219 Gastro-esophageal reflux disease without esophagitis: Secondary | ICD-10-CM | POA: Diagnosis not present

## 2021-01-24 DIAGNOSIS — F418 Other specified anxiety disorders: Secondary | ICD-10-CM | POA: Diagnosis not present

## 2021-01-24 DIAGNOSIS — Z96641 Presence of right artificial hip joint: Secondary | ICD-10-CM | POA: Diagnosis not present

## 2021-01-24 DIAGNOSIS — E039 Hypothyroidism, unspecified: Secondary | ICD-10-CM | POA: Diagnosis not present

## 2021-01-24 DIAGNOSIS — K7581 Nonalcoholic steatohepatitis (NASH): Secondary | ICD-10-CM | POA: Diagnosis not present

## 2021-01-24 DIAGNOSIS — R2681 Unsteadiness on feet: Secondary | ICD-10-CM | POA: Diagnosis not present

## 2021-01-24 DIAGNOSIS — E785 Hyperlipidemia, unspecified: Secondary | ICD-10-CM | POA: Diagnosis not present

## 2021-01-24 DIAGNOSIS — M6281 Muscle weakness (generalized): Secondary | ICD-10-CM | POA: Insufficient documentation

## 2021-01-24 DIAGNOSIS — I10 Essential (primary) hypertension: Secondary | ICD-10-CM | POA: Diagnosis not present

## 2021-01-24 DIAGNOSIS — Z720 Tobacco use: Secondary | ICD-10-CM | POA: Diagnosis not present

## 2021-01-24 DIAGNOSIS — G500 Trigeminal neuralgia: Secondary | ICD-10-CM | POA: Diagnosis not present

## 2021-01-24 DIAGNOSIS — R339 Retention of urine, unspecified: Secondary | ICD-10-CM | POA: Diagnosis not present

## 2021-01-24 DIAGNOSIS — Z9181 History of falling: Secondary | ICD-10-CM | POA: Diagnosis not present

## 2021-01-25 DIAGNOSIS — R5383 Other fatigue: Secondary | ICD-10-CM | POA: Diagnosis not present

## 2021-01-25 DIAGNOSIS — M6281 Muscle weakness (generalized): Secondary | ICD-10-CM | POA: Diagnosis not present

## 2021-01-25 DIAGNOSIS — Z96641 Presence of right artificial hip joint: Secondary | ICD-10-CM | POA: Diagnosis not present

## 2021-01-25 DIAGNOSIS — R2681 Unsteadiness on feet: Secondary | ICD-10-CM | POA: Diagnosis not present

## 2021-01-25 DIAGNOSIS — R600 Localized edema: Secondary | ICD-10-CM | POA: Diagnosis not present

## 2021-01-25 DIAGNOSIS — S72001D Fracture of unspecified part of neck of right femur, subsequent encounter for closed fracture with routine healing: Secondary | ICD-10-CM | POA: Diagnosis not present

## 2021-01-28 ENCOUNTER — Other Ambulatory Visit: Payer: Self-pay | Admitting: Family Medicine

## 2021-02-03 DIAGNOSIS — R2681 Unsteadiness on feet: Secondary | ICD-10-CM | POA: Diagnosis not present

## 2021-02-03 DIAGNOSIS — R5383 Other fatigue: Secondary | ICD-10-CM | POA: Diagnosis not present

## 2021-02-03 DIAGNOSIS — R600 Localized edema: Secondary | ICD-10-CM | POA: Diagnosis not present

## 2021-02-03 DIAGNOSIS — Z96641 Presence of right artificial hip joint: Secondary | ICD-10-CM | POA: Diagnosis not present

## 2021-02-03 DIAGNOSIS — M6281 Muscle weakness (generalized): Secondary | ICD-10-CM | POA: Diagnosis not present

## 2021-02-03 DIAGNOSIS — S72001D Fracture of unspecified part of neck of right femur, subsequent encounter for closed fracture with routine healing: Secondary | ICD-10-CM | POA: Diagnosis not present

## 2021-02-09 ENCOUNTER — Ambulatory Visit: Payer: Medicare Other | Admitting: Family Medicine

## 2021-02-12 ENCOUNTER — Ambulatory Visit (INDEPENDENT_AMBULATORY_CARE_PROVIDER_SITE_OTHER): Payer: Medicare Other | Admitting: Family Medicine

## 2021-02-12 ENCOUNTER — Other Ambulatory Visit: Payer: Self-pay

## 2021-02-12 ENCOUNTER — Encounter: Payer: Self-pay | Admitting: Family Medicine

## 2021-02-12 VITALS — BP 113/80 | HR 79 | Temp 97.4°F | Ht 68.0 in | Wt 153.6 lb

## 2021-02-12 DIAGNOSIS — Z23 Encounter for immunization: Secondary | ICD-10-CM | POA: Diagnosis not present

## 2021-02-12 DIAGNOSIS — R0989 Other specified symptoms and signs involving the circulatory and respiratory systems: Secondary | ICD-10-CM

## 2021-02-12 DIAGNOSIS — E039 Hypothyroidism, unspecified: Secondary | ICD-10-CM

## 2021-02-12 MED ORDER — DICLOFENAC SODIUM 1 % EX GEL: 2.0000 g | Freq: Four times a day (QID) | CUTANEOUS | 1 refills | Status: DC

## 2021-02-12 NOTE — Progress Notes (Signed)
Subjective:    Patient ID: Shari Branch, female    DOB: [birth year], 75 y.o.   MRN: [id number]   Patient is a 75 year old Caucasian female who fell and fractured her right hip in September.  Initial surgery did not heal properly when she underwent a right hip replacement.  She is still walking with a walker and having quite a bit of pain in her hip when she ambulates.  During that time she has been using a walker she developed pain in her left first [facility] joint.  There is tenderness with flexion and extension.  There is no palpable abnormality.  There is no erythema or effusion.  She has full range of motion.  She has a negative Tinel's sign.  There is no numbness or tingling in her hands.  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  She has been taking naproxen occasionally for pain.  In the hospital, her hemoglobin dropped substantially after surgery.  That needs to be followed up.  She is also long overdue to check a TSH.  Today on exam she has bibasilar crackles in both lung fields pronounced.  She is a longtime smoker.  She denies any cough or pleurisy or hemoptysis.  Unfortunately her husband is currently on hospice for prostate cancer. 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 Oneida Alar: poor prep, inadequate exam   COLONOSCOPY WITH ESOPHAGOGASTRODUODENOSCOPY (EGD)  2009   Dr. Oneida Alar: good prep, normal TI, normal colon,  hemorrhoids, gastritis, nodule in cardia (benign)   EAR PINNA RECONSTRUCTION W/ RIB GRAFT     7 surgeries starting at age 89   ESOPHAGOGASTRODUODENOSCOPY  2008   Dr. Oneida Alar: Reflux esophagitis, large hh, gastritis.    [name] EXCISION Right 09/19/2013   Procedure: EXCISION [name] FOOT;  Surgeon: Marcheta Grammes, DPM;  Location: [facility];  Service: Podiatry;  Laterality: Right;   HARDWARE REMOVAL Right 11/16/2020   Procedure: HARDWARE REMOVAL;  Surgeon: Mordecai Rasmussen, MD;  Location: [facility];  Service: Orthopedics;  Laterality: Right;  Removal of hardware from right hip; cannulated screws (Arthrex)   HIP ARTHROPLASTY Right 11/16/2020   Procedure: ARTHROPLASTY BIPOLAR HIP (HEMIARTHROPLASTY);  Surgeon: Mordecai Rasmussen, MD;  Location: [facility];  Service: Orthopedics;  Laterality: Right;   HIP PINNING,CANNULATED Right 10/07/2020   Procedure: CANNULATED HIP PINNING;  Surgeon: Mordecai Rasmussen, MD;  Location: [facility];  Service: Orthopedics;  Laterality: Right;  CRPP of right nondisplaced femoral neck fracture   OSTECTOMY Right 09/19/2013   Procedure: OSTECTOMY;  Surgeon: Marcheta Grammes, DPM;  Location: [facility];  Service: Podiatry;  Laterality: Right;   RETINAL DETACHMENT SURGERY     bilateral   TONSILLECTOMY     Current Outpatient Medications on  File Prior to Visit  Medication Sig Dispense Refill   amitriptyline (ELAVIL) 50 MG tablet TAKE 3 TABLETS BY MOUTH EVERY DAY AT BEDTIME 90 tablet 0   atorvastatin (LIPITOR) 20 MG tablet Take 1 tablet (20 mg total) by mouth daily. 90 tablet 3   escitalopram (LEXAPRO) 20 MG tablet Take 1 tablet by mouth once daily 90 tablet 0   folic acid (FOLVITE) 1 MG tablet Take 1 tablet (1 mg total) by mouth daily.     furosemide (LASIX) 20 MG tablet Take 1 tablet (20 mg total) by mouth daily as needed (leg swelling). 30 tablet 3   levothyroxine (SYNTHROID) 112 MCG tablet Take 1 tablet by mouth once daily 90 tablet 0   magnesium oxide (MAG-OX) 400 (240 Mg) MG  tablet Take 1 tablet (400 mg total) by mouth 2 (two) times daily.     metoprolol tartrate (LOPRESSOR) 25 MG tablet Take 1 tablet (25 mg total) by mouth 2 (two) times daily.     pantoprazole (PROTONIX) 40 MG tablet Take 1 tablet by mouth twice daily 180 tablet 0   polyethylene glycol (MIRALAX / GLYCOLAX) 17 g packet Take 17 g by mouth daily. 14 each 0   senna-docusate (SENOKOT-S) 8.6-50 MG tablet Take 2 tablets by mouth 2 (two) times daily.     vitamin B-12 (CYANOCOBALAMIN) 1000 MCG tablet Take 1 tablet (1,000 mcg total) by mouth daily.     No current facility-administered medications on file prior to visit.   Allergies  Allergen Reactions   Sulfonamide Derivatives Other (See Comments)    REACTION: Joint Pain and locking - walks like a 75 year old   Social History   Socioeconomic History   Marital status: Married    Spouse name: Not on file   Number of children: 3   Years of education: Not on file   Highest education level: Not on file  Occupational History   Occupation: PT - [employer]  Tobacco Use   Smoking status: Every Day    Packs/day: 1.50    Types: Cigarettes   Smokeless tobacco: Never  Vaping Use   Vaping Use: Never used  Substance and Sexual Activity   Alcohol use: No   Drug use: No   Sexual activity: Not on file  Other Topics Concern   Not on file  Social History Narrative   Not on file   Social Determinants of Health   Financial Resource Strain: Not on file  Food Insecurity: Not on file  Transportation Needs: Not on file  Physical Activity: Not on file  Stress: Not on file  Social Connections: Not on file  Intimate Partner Violence: Not on file     Past Medical History:  Diagnosis Date   Anxiety    GERD 01/25/2007   Qualifier: Diagnosis of  By: [place] LPN, Kim     Hepatic steatosis    Hyperlipidemia    Liver hemangioma    Osteopenia    6/17 T -2.1 Left hip   Thyroid disease    Trigeminal neuralgia 03/08/1995   Trigemninal Tumor- Left, [employer]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 Oneida Alar: poor prep, inadequate exam   COLONOSCOPY WITH  ESOPHAGOGASTRODUODENOSCOPY (EGD)  2009   Dr. Oneida Alar: good prep, normal TI, normal colon, hemorrhoids, gastritis, nodule in cardia (benign)   EAR PINNA RECONSTRUCTION W/ RIB GRAFT     7 surgeries starting at age 10   ESOPHAGOGASTRODUODENOSCOPY  2008   Dr. Oneida Alar: Reflux esophagitis, large hh, gastritis.    [name] EXCISION Right 09/19/2013   Procedure: EXCISION [name] FOOT;  Surgeon: Marcheta Grammes, DPM;  Location: [facility];  Service: Podiatry;  Laterality: Right;   HARDWARE REMOVAL Right 11/16/2020   Procedure: HARDWARE REMOVAL;  Surgeon: Mordecai Rasmussen, MD;  Location: [facility];  Service: Orthopedics;  Laterality: Right;  Removal of hardware from right hip; cannulated screws (Arthrex)   HIP ARTHROPLASTY Right 11/16/2020   Procedure: ARTHROPLASTY BIPOLAR HIP (HEMIARTHROPLASTY);  Surgeon: Mordecai Rasmussen, MD;  Location: [facility];  Service: Orthopedics;  Laterality: Right;   HIP PINNING,CANNULATED Right 10/07/2020   Procedure: CANNULATED HIP PINNING;  Surgeon: Mordecai Rasmussen, MD;  Location: [facility];  Service: Orthopedics;  Laterality: Right;  CRPP of right nondisplaced femoral neck fracture   OSTECTOMY Right 09/19/2013   Procedure: OSTECTOMY;  Surgeon: Marcheta Grammes, DPM;  Location: [facility];  Service: Podiatry;  Laterality: Right;   RETINAL DETACHMENT SURGERY     bilateral   TONSILLECTOMY     Current Outpatient Medications on File Prior to Visit  Medication Sig Dispense Refill   amitriptyline (ELAVIL) 50 MG tablet TAKE 3 TABLETS BY MOUTH EVERY DAY AT BEDTIME 90 tablet 0   atorvastatin (LIPITOR) 20 MG tablet Take 1 tablet (20 mg total) by mouth daily.  90 tablet 3   escitalopram (LEXAPRO) 20 MG tablet Take 1 tablet by mouth once daily 90 tablet 0   folic acid (FOLVITE) 1 MG tablet Take 1 tablet (1 mg total) by mouth daily.     furosemide (LASIX) 20 MG tablet Take 1 tablet (20 mg total) by mouth daily as needed (leg swelling). 30 tablet 3   levothyroxine (SYNTHROID) 112 MCG tablet Take 1 tablet by mouth once daily 90 tablet 0   magnesium oxide (MAG-OX) 400 (240 Mg) MG tablet Take 1 tablet (400 mg total) by mouth 2 (two) times daily.     metoprolol tartrate (LOPRESSOR) 25 MG tablet Take 1 tablet (25 mg total) by mouth 2 (two) times daily.     pantoprazole (PROTONIX) 40 MG tablet Take 1 tablet by mouth twice daily 180 tablet 0   polyethylene glycol (MIRALAX / GLYCOLAX) 17 g packet Take 17 g by mouth daily. 14 each 0   senna-docusate (SENOKOT-S) 8.6-50 MG tablet Take 2 tablets by mouth 2 (two) times daily.     vitamin B-12 (CYANOCOBALAMIN) 1000 MCG tablet Take 1 tablet (1,000 mcg total) by mouth daily.     No current facility-administered medications on file prior to visit.   Allergies  Allergen Reactions   Sulfonamide Derivatives Other (See Comments)    REACTION: Joint Pain and locking - walks like a 75 year old   Social History   Socioeconomic History   Marital status: Married    Spouse name: Not on file   Number of children: 3   Years of education: Not on file   Highest education level: Not on file  Occupational History   Occupation: PT - [employer]  Tobacco Use   Smoking status: Every Day    Packs/day: 1.50    Types: Cigarettes   Smokeless tobacco: Never  Vaping Use   Vaping Use: Never used  Substance  and Sexual Activity   Alcohol use: No   Drug use: No   Sexual activity: Not on file  Other Topics Concern   Not on file  Social History Narrative   Not on file   Social Determinants of Health   Financial Resource Strain: Not on file  Food Insecurity: Not on file  Transportation Needs: Not on file  Physical Activity: Not on  file  Stress: Not on file  Social Connections: Not on file  Intimate Partner Violence: Not on file      Review of Systems  All other systems reviewed and are negative.     Objective:   Physical Exam Vitals reviewed.  Constitutional:      General: She is not in acute distress.    Appearance: She is well-developed. She is not diaphoretic.  Cardiovascular:     Rate and Rhythm: Normal rate and regular rhythm.     Heart sounds: Normal heart sounds. No murmur heard.   No gallop.  Pulmonary:     Effort: Pulmonary effort is normal. No respiratory distress.     Breath sounds: Examination of the right-lower field reveals rales. Examination of the left-lower field reveals rales. Rales present. No wheezing or rhonchi.  Musculoskeletal:     Left hand: Tenderness present. No swelling, deformity or bony tenderness. Normal range of motion. Normal sensation.       Hands:     Lumbar back: Tenderness present. No spasms or bony tenderness. Decreased range of motion.       Back:  Neurological:     Mental Status: She is alert and oriented to person, place, and time.     Cranial Nerves: No cranial nerve deficit.     Sensory: No sensory deficit.     Motor: No atrophy or abnormal muscle tone.     Coordination: Coordination abnormal.     Gait: Gait abnormal.     Deep Tendon Reflexes: Reflexes are normal and symmetric.          Assessment & Plan:  Hypothyroidism, unspecified type - Plan: CBC with Differential/Platelet, COMPLETE METABOLIC PANEL WITH GFR, TSH  Abnormal lung sounds - Plan: DG Chest 2 View I suspect that she has developed arthritis in the first [facility] joint.  There was attendance on a walker from her recent hip fracture and surgeries.  I recommended Voltaren gel 2 g 4 times daily to the affected area to avoid GI toxicity.  While the patient is here I would like to follow-up her postoperative anemia with a CBC and CMP.  I would also like to check a TSH to follow-up for treatment of  hypothyroidism.  On her exam she does have bibasilar crackles and abnormal breath sounds.  She is asymptomatic.  This could be atelectasis like to check a chest x-ray.  If abnormal, will proceed with CT scan to evaluate further in this smoker.

## 2021-02-13 LAB — CBC WITH DIFFERENTIAL/PLATELET
Absolute Monocytes: 150 cells/uL — ABNORMAL LOW (ref 200–950)
Basophils Absolute: 10 cells/uL (ref 0–200)
Basophils Relative: 0.3 %
Eosinophils Absolute: 90 cells/uL (ref 15–500)
Eosinophils Relative: 2.8 %
HCT: 34.1 % — ABNORMAL LOW (ref 35.0–45.0)
Hemoglobin: 11.2 g/dL — ABNORMAL LOW (ref 11.7–15.5)
Lymphs Abs: 586 cells/uL — ABNORMAL LOW (ref 850–3900)
MCH: 30.8 pg (ref 27.0–33.0)
MCHC: 32.8 g/dL (ref 32.0–36.0)
MCV: 93.7 fL (ref 80.0–100.0)
MPV: 10.4 fL (ref 7.5–12.5)
Monocytes Relative: 4.7 %
Neutro Abs: 2365 cells/uL (ref 1500–7800)
Neutrophils Relative %: 73.9 %
Platelets: 182 10*3/uL (ref 140–400)
RBC: 3.64 10*6/uL — ABNORMAL LOW (ref 3.80–5.10)
RDW: 13.7 % (ref 11.0–15.0)
Total Lymphocyte: 18.3 %
WBC: 3.2 10*3/uL — ABNORMAL LOW (ref 3.8–10.8)

## 2021-02-13 LAB — COMPLETE METABOLIC PANEL WITH GFR
AG Ratio: 1.8 (calc) (ref 1.0–2.5)
ALT: 8 U/L (ref 6–29)
AST: 15 U/L (ref 10–35)
Albumin: 3.7 g/dL (ref 3.6–5.1)
Alkaline phosphatase (APISO): 119 U/L (ref 37–153)
BUN/Creatinine Ratio: 14 (calc) (ref 6–22)
BUN: 6 mg/dL — ABNORMAL LOW (ref 7–25)
CO2: 32 mmol/L (ref 20–32)
Calcium: 8.7 mg/dL (ref 8.6–10.4)
Chloride: 100 mmol/L (ref 98–110)
Creat: 0.44 mg/dL — ABNORMAL LOW (ref 0.60–1.00)
Globulin: 2.1 g/dL (calc) (ref 1.9–3.7)
Glucose, Bld: 84 mg/dL (ref 65–99)
Potassium: 3.7 mmol/L (ref 3.5–5.3)
Sodium: 139 mmol/L (ref 135–146)
Total Bilirubin: 0.5 mg/dL (ref 0.2–1.2)
Total Protein: 5.8 g/dL — ABNORMAL LOW (ref 6.1–8.1)
eGFR: 100 mL/min/{1.73_m2} (ref >=60)

## 2021-02-13 LAB — TSH: TSH: 13.45 mIU/L — ABNORMAL HIGH (ref 0.40–4.50)

## 2021-02-15 ENCOUNTER — Telehealth: Payer: Self-pay

## 2021-02-15 MED ORDER — LEVOTHYROXINE SODIUM 125 MCG PO TABS: 125.0000 ug | ORAL_TABLET | Freq: Every day | ORAL | 0 refills | Status: DC

## 2021-02-15 NOTE — Telephone Encounter (Signed)
Patient aware of results and recommendations.  Appointment scheduled.

## 2021-02-15 NOTE — Telephone Encounter (Signed)
-----   Message from Susy Frizzle, MD sent at 02/15/2021  6:56 AM EST ----- Thyroid shows she is not taking enough levothyroxine.  Increase levothyroxine to 125 mcg daily and recheck TSH in 6 weeks

## 2021-02-16 DIAGNOSIS — M6281 Muscle weakness (generalized): Secondary | ICD-10-CM | POA: Diagnosis not present

## 2021-02-16 DIAGNOSIS — R2681 Unsteadiness on feet: Secondary | ICD-10-CM | POA: Diagnosis not present

## 2021-02-16 DIAGNOSIS — Z96641 Presence of right artificial hip joint: Secondary | ICD-10-CM | POA: Diagnosis not present

## 2021-02-16 DIAGNOSIS — D500 Iron deficiency anemia secondary to blood loss (chronic): Secondary | ICD-10-CM | POA: Diagnosis not present

## 2021-02-16 DIAGNOSIS — R600 Localized edema: Secondary | ICD-10-CM | POA: Diagnosis not present

## 2021-02-16 DIAGNOSIS — Z9181 History of falling: Secondary | ICD-10-CM | POA: Diagnosis not present

## 2021-02-16 DIAGNOSIS — R5383 Other fatigue: Secondary | ICD-10-CM | POA: Diagnosis not present

## 2021-02-17 DIAGNOSIS — R600 Localized edema: Secondary | ICD-10-CM | POA: Diagnosis not present

## 2021-02-17 DIAGNOSIS — M6281 Muscle weakness (generalized): Secondary | ICD-10-CM | POA: Diagnosis not present

## 2021-02-17 DIAGNOSIS — Z96641 Presence of right artificial hip joint: Secondary | ICD-10-CM | POA: Diagnosis not present

## 2021-02-17 DIAGNOSIS — R2681 Unsteadiness on feet: Secondary | ICD-10-CM | POA: Diagnosis not present

## 2021-02-17 DIAGNOSIS — S72001D Fracture of unspecified part of neck of right femur, subsequent encounter for closed fracture with routine healing: Secondary | ICD-10-CM | POA: Diagnosis not present

## 2021-02-17 DIAGNOSIS — R5383 Other fatigue: Secondary | ICD-10-CM | POA: Diagnosis not present

## 2021-02-19 DIAGNOSIS — Z96641 Presence of right artificial hip joint: Secondary | ICD-10-CM | POA: Diagnosis not present

## 2021-02-19 DIAGNOSIS — R600 Localized edema: Secondary | ICD-10-CM | POA: Diagnosis not present

## 2021-02-19 DIAGNOSIS — S72001D Fracture of unspecified part of neck of right femur, subsequent encounter for closed fracture with routine healing: Secondary | ICD-10-CM | POA: Diagnosis not present

## 2021-02-19 DIAGNOSIS — R5383 Other fatigue: Secondary | ICD-10-CM | POA: Diagnosis not present

## 2021-02-19 DIAGNOSIS — R2681 Unsteadiness on feet: Secondary | ICD-10-CM | POA: Diagnosis not present

## 2021-02-19 DIAGNOSIS — M6281 Muscle weakness (generalized): Secondary | ICD-10-CM | POA: Diagnosis not present

## 2021-02-23 DIAGNOSIS — R600 Localized edema: Secondary | ICD-10-CM | POA: Diagnosis not present

## 2021-02-23 DIAGNOSIS — M6281 Muscle weakness (generalized): Secondary | ICD-10-CM | POA: Diagnosis not present

## 2021-02-23 DIAGNOSIS — E785 Hyperlipidemia, unspecified: Secondary | ICD-10-CM | POA: Diagnosis not present

## 2021-02-23 DIAGNOSIS — R2681 Unsteadiness on feet: Secondary | ICD-10-CM | POA: Diagnosis not present

## 2021-02-23 DIAGNOSIS — D1809 Hemangioma of other sites: Secondary | ICD-10-CM | POA: Diagnosis not present

## 2021-02-23 DIAGNOSIS — Z9181 History of falling: Secondary | ICD-10-CM | POA: Diagnosis not present

## 2021-02-23 DIAGNOSIS — R339 Retention of urine, unspecified: Secondary | ICD-10-CM | POA: Diagnosis not present

## 2021-02-23 DIAGNOSIS — Z96641 Presence of right artificial hip joint: Secondary | ICD-10-CM | POA: Diagnosis not present

## 2021-02-23 DIAGNOSIS — G500 Trigeminal neuralgia: Secondary | ICD-10-CM | POA: Diagnosis not present

## 2021-02-23 DIAGNOSIS — Z720 Tobacco use: Secondary | ICD-10-CM | POA: Diagnosis not present

## 2021-02-23 DIAGNOSIS — K7581 Nonalcoholic steatohepatitis (NASH): Secondary | ICD-10-CM | POA: Diagnosis not present

## 2021-02-23 DIAGNOSIS — I10 Essential (primary) hypertension: Secondary | ICD-10-CM | POA: Diagnosis not present

## 2021-02-23 DIAGNOSIS — D500 Iron deficiency anemia secondary to blood loss (chronic): Secondary | ICD-10-CM | POA: Diagnosis not present

## 2021-02-23 DIAGNOSIS — K5900 Constipation, unspecified: Secondary | ICD-10-CM | POA: Diagnosis not present

## 2021-02-23 DIAGNOSIS — R1310 Dysphagia, unspecified: Secondary | ICD-10-CM | POA: Diagnosis not present

## 2021-02-23 DIAGNOSIS — K219 Gastro-esophageal reflux disease without esophagitis: Secondary | ICD-10-CM | POA: Diagnosis not present

## 2021-02-23 DIAGNOSIS — R5383 Other fatigue: Secondary | ICD-10-CM | POA: Diagnosis not present

## 2021-02-23 DIAGNOSIS — E039 Hypothyroidism, unspecified: Secondary | ICD-10-CM | POA: Diagnosis not present

## 2021-02-23 DIAGNOSIS — F418 Other specified anxiety disorders: Secondary | ICD-10-CM | POA: Diagnosis not present

## 2021-02-23 DIAGNOSIS — S72001D Fracture of unspecified part of neck of right femur, subsequent encounter for closed fracture with routine healing: Secondary | ICD-10-CM | POA: Diagnosis not present

## 2021-03-05 ENCOUNTER — Encounter: Payer: Self-pay | Admitting: Orthopedic Surgery

## 2021-03-05 ENCOUNTER — Ambulatory Visit (INDEPENDENT_AMBULATORY_CARE_PROVIDER_SITE_OTHER): Payer: Medicare Other | Admitting: Orthopedic Surgery

## 2021-03-05 ENCOUNTER — Ambulatory Visit: Payer: Medicare Other

## 2021-03-05 ENCOUNTER — Other Ambulatory Visit: Payer: Self-pay

## 2021-03-05 ENCOUNTER — Telehealth: Payer: Self-pay | Admitting: Family Medicine

## 2021-03-05 DIAGNOSIS — S72001A Fracture of unspecified part of neck of right femur, initial encounter for closed fracture: Secondary | ICD-10-CM | POA: Diagnosis not present

## 2021-03-05 NOTE — Progress Notes (Signed)
Orthopaedic Postop Note  Assessment: Shari Branch is a 75 y.o. female s/p CRPP of right nondisplaced femoral neck fracture (10/07/20); revision to a right hip hemiarthroplasty 11/16/20   Plan: Patient is doing very well following her most recent procedure.  She is now ambulating without assistive device.  Reviewed radiographs with the patient in clinic today, which demonstrate stable positioning of the right hip hemiarthroplasty.  Anticipate that the lateral hip pain she is experiencing, will continue to improve.  Encouraged her to continue to walk to help strengthen her right leg, and improve her endurance.  I stressed to her the importance of being careful while taking care of her husband who is sick, as I do not want her to sustain another injury.  She stated her understanding.  We will plan to see her back in 3 months for repeat evaluation.    Follow-up: Return in about 3 months (around 06/03/2021). XR at next visit: AP pelvis, right hip  Subjective:  Chief Complaint  Patient presents with   Hip Injury    RT DOS 11/16/20     History of Present Illness: Shari Branch is a 75 y.o. female who presents following the above stated procedure.  Her most recent surgery was just over 3 months ago.  She continues to improve.  She is no longer using a walker to assist with ambulation.  She denies taking medications for pain.  She does have some pain over the lateral hip, which worsens throughout the day.  Otherwise, she denies start up pain and pain in the right groin.  No issues with her surgical incisions.  Unfortunately, her husband is not doing well.  He is sustained another fall, and is apparently bedridden.  As a result, she is providing him with a lot of his personal care.  In addition, hospice has evaluated her husband, and they have reviewed some potential treatment options.  Review of Systems: No fevers or chills No numbness or tingling No Chest Pain No shortness of  breath   Objective: There were no vitals taken for this visit.  Physical Exam:  Elderly female.  No acute distress.  Alert and oriented.  Steady gait without assistive device.  Evaluation of the right lateral hip demonstrates well-healed surgical incisions.  No surrounding erythema or drainage.  No tenderness to palpation in line with the incisions.  She is able to maintain a straight leg raise.  She tolerates internal and external rotation of her right hip, without pain in the groin.  No pain with axial loading.  Toes are warm and well-perfused.  Sensation is intact over the dorsum of her foot.   IMAGING: I personally ordered and reviewed the following images:  X-rays of the right hip were obtained in clinic today, compared to previous x-rays.  Right hip hemiarthroplasty remains in good position.  No evidence of hardware failure or loosening.  There is no subsidence of the implants.  No acute injuries are noted.  The hip joint remains well reduced.  Impression: Right hip hemiarthroplasty in stable position   Mordecai Rasmussen, MD 03/05/2021 12:48 PM

## 2021-03-05 NOTE — Telephone Encounter (Signed)
I attempted to leavemessage for patient to call back and schedule Medicare Annual Wellness Visit (AWV) in office. No answer.  If not able to come in office, please offer to do virtually or by telephone.  Left office number and my jabber #[phone].  Due for AWVI  Please schedule at anytime with Nurse Health Advisor.   

## 2021-03-09 ENCOUNTER — Other Ambulatory Visit: Payer: Self-pay

## 2021-03-09 MED ORDER — METOPROLOL TARTRATE 25 MG PO TABS: 25.0000 mg | ORAL_TABLET | Freq: Two times a day (BID) | ORAL | 3 refills | Status: DC

## 2021-03-15 ENCOUNTER — Other Ambulatory Visit: Payer: Self-pay | Admitting: Family Medicine

## 2021-03-15 DIAGNOSIS — R5383 Other fatigue: Secondary | ICD-10-CM | POA: Diagnosis not present

## 2021-03-15 DIAGNOSIS — S72001D Fracture of unspecified part of neck of right femur, subsequent encounter for closed fracture with routine healing: Secondary | ICD-10-CM | POA: Diagnosis not present

## 2021-03-15 DIAGNOSIS — Z96641 Presence of right artificial hip joint: Secondary | ICD-10-CM | POA: Diagnosis not present

## 2021-03-15 DIAGNOSIS — R600 Localized edema: Secondary | ICD-10-CM | POA: Diagnosis not present

## 2021-03-15 DIAGNOSIS — R2681 Unsteadiness on feet: Secondary | ICD-10-CM | POA: Diagnosis not present

## 2021-03-15 DIAGNOSIS — M6281 Muscle weakness (generalized): Secondary | ICD-10-CM | POA: Diagnosis not present

## 2021-03-18 ENCOUNTER — Ambulatory Visit (INDEPENDENT_AMBULATORY_CARE_PROVIDER_SITE_OTHER): Payer: Medicare Other

## 2021-03-18 ENCOUNTER — Other Ambulatory Visit: Payer: Self-pay

## 2021-03-18 VITALS — Ht 68.0 in | Wt 153.0 lb

## 2021-03-18 DIAGNOSIS — Z780 Asymptomatic menopausal state: Secondary | ICD-10-CM

## 2021-03-18 DIAGNOSIS — Z0000 Encounter for general adult medical examination without abnormal findings: Secondary | ICD-10-CM

## 2021-03-18 DIAGNOSIS — Z1231 Encounter for screening mammogram for malignant neoplasm of breast: Secondary | ICD-10-CM | POA: Diagnosis not present

## 2021-03-18 NOTE — Progress Notes (Signed)
Subjective:   Shari Branch is a 76 y.o. female who presents for an Initial Medicare Annual Wellness Visit. Virtual Visit via Telephone Note  I connected with  [name] on 03/18/21 at  2:45 PM EST by telephone and verified that I am speaking with the correct person using two identifiers.  Location: Patient: HOME Provider: [facility] Persons participating in the virtual visit: patient/Nurse Health Advisor   I discussed the limitations, risks, security and privacy concerns of performing an evaluation and management service by telephone and the availability of in person appointments. The patient expressed understanding and agreed to proceed.  Interactive audio and video telecommunications were attempted between this nurse and patient, however failed, due to patient having technical difficulties OR patient did not have access to video capability.  We continued and completed visit with audio only.  Some vital signs may be absent or patient reported.   Chriss Driver, LPN  Review of Systems     Cardiac Risk Factors include: advanced age (>46men, >67 women);dyslipidemia;sedentary lifestyle  PHONE VISIT. PT AT HOME. NURSE AT [facility].    Objective:    Today's Vitals   03/18/21 1448  Weight: 153 lb (69.4 kg)  Height: 5\' 8"  (1.727 m)   Body mass index is 23.26 kg/m.  Advanced Directives 03/18/2021 11/16/2020 11/12/2020 10/07/2020 10/06/2020 10/06/2020 08/01/2019  Does Patient Have a Medical Advance Directive? Yes No No No No No No  Type of Paramedic of [place];Living will - - - - - -  Copy of [place] in Chart? No - copy requested - - - - - -  Would patient like information on creating a medical advance directive? - Yes (Inpatient - patient defers creating a medical advance directive at this time - Information given) No - Patient declined No - Patient declined No - Patient declined No - Patient declined No - Patient declined  Pre-existing out of  facility DNR order (yellow form or pink MOST form) - - - - - - -    Current Medications (verified) Outpatient Encounter Medications as of 03/18/2021  Medication Sig   amitriptyline (ELAVIL) 50 MG tablet TAKE 3 TABLETS BY MOUTH EVERY DAY AT BEDTIME   atorvastatin (LIPITOR) 20 MG tablet Take 1 tablet (20 mg total) by mouth daily.   diclofenac Sodium (VOLTAREN) 1 % GEL Apply 2 g topically 4 (four) times daily.   escitalopram (LEXAPRO) 20 MG tablet Take 1 tablet by mouth once daily   furosemide (LASIX) 20 MG tablet Take 1 tablet (20 mg total) by mouth daily as needed (leg swelling).   levothyroxine (SYNTHROID) 125 MCG tablet Take 1 tablet (125 mcg total) by mouth daily.   metoprolol tartrate (LOPRESSOR) 25 MG tablet Take 1 tablet (25 mg total) by mouth 2 (two) times daily.   pantoprazole (PROTONIX) 40 MG tablet Take 1 tablet by mouth twice daily   [DISCONTINUED] folic acid (FOLVITE) 1 MG tablet Take 1 tablet (1 mg total) by mouth daily.   [DISCONTINUED] magnesium oxide (MAG-OX) 400 (240 Mg) MG tablet Take 1 tablet (400 mg total) by mouth 2 (two) times daily.   [DISCONTINUED] polyethylene glycol (MIRALAX / GLYCOLAX) 17 g packet Take 17 g by mouth daily.   [DISCONTINUED] senna-docusate (SENOKOT-S) 8.6-50 MG tablet Take 2 tablets by mouth 2 (two) times daily.   [DISCONTINUED] vitamin B-12 (CYANOCOBALAMIN) 1000 MCG tablet Take 1 tablet (1,000 mcg total) by mouth daily.   No facility-administered encounter medications on file as of 03/18/2021.  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 Oneida Alar: poor prep, inadequate exam   COLONOSCOPY WITH ESOPHAGOGASTRODUODENOSCOPY (EGD)  2009   Dr. Oneida Alar: good prep, normal TI, normal colon, hemorrhoids, gastritis, nodule in cardia (benign)   EAR PINNA RECONSTRUCTION W/ RIB GRAFT     7 surgeries starting at age 46   ESOPHAGOGASTRODUODENOSCOPY  2008   Dr. Oneida Alar: Reflux esophagitis, large hh, gastritis.    [name] EXCISION Right 09/19/2013   Procedure: EXCISION [name] FOOT;  Surgeon: Marcheta Grammes, DPM;  Location: [facility];  Service: Podiatry;  Laterality: Right;   HARDWARE REMOVAL Right 11/16/2020   Procedure: HARDWARE REMOVAL;  Surgeon: Mordecai Rasmussen, MD;  Location: [facility];  Service: Orthopedics;  Laterality: Right;  Removal of hardware from right hip; cannulated screws (Arthrex)   HIP ARTHROPLASTY Right 11/16/2020   Procedure: ARTHROPLASTY BIPOLAR HIP (HEMIARTHROPLASTY);  Surgeon: Mordecai Rasmussen, MD;  Location: [facility];  Service: Orthopedics;  Laterality: Right;   HIP PINNING,CANNULATED Right 10/07/2020   Procedure: CANNULATED HIP PINNING;  Surgeon: Mordecai Rasmussen, MD;  Location: [facility];  Service: Orthopedics;  Laterality: Right;  CRPP of right nondisplaced femoral neck fracture   OSTECTOMY Right 09/19/2013   Procedure: OSTECTOMY;  Surgeon: Marcheta Grammes, DPM;  Location: [facility];  Service: Podiatry;  Laterality: Right;   RETINAL DETACHMENT SURGERY     bilateral   TONSILLECTOMY     Family History  Problem Relation Age of Onset   Colon cancer Paternal Grandmother 14   Pancreatic cancer Father        deceased age 38   Other Other        retinal detachment in multiple family member at early age   Social History   Socioeconomic History   Marital status: Married    Spouse name: Shari Branch   Number  of children: 3   Years of education: Not on file   Highest education level: Not on file  Occupational History   Occupation: PT - [employer]  Tobacco Use   Smoking status: Every Day    Packs/day: 1.50    Types: Cigarettes   Smokeless tobacco: Never  Vaping Use   Vaping Use: Never used  Substance and Sexual Activity   Alcohol use: No   Drug use: No   Sexual activity: Not on file  Other Topics Concern   Not on file  Social History Narrative   Married 20 years in 2022   Grandchildren and great grandchildren   Social Determinants of Health   Financial Resource Strain: Low Risk    Difficulty of Paying Living Expenses: Not hard at all  Food Insecurity: No Food Insecurity   Worried About Charity fundraiser in the Last  Year: Never true   Pablo Pena in the Last Year: Never true  Transportation Needs: No Transportation Needs   Lack of Transportation (Medical): No   Lack of Transportation (Non-Medical): No  Physical Activity: Insufficiently Active   Days of Exercise per Week: 3 days   Minutes of Exercise per Session: 20 min  Stress: Stress Concern Present   Feeling of Stress : To some extent  Social Connections: Moderately Isolated   Frequency of Communication with Friends and Family: More than three times a week   Frequency of Social Gatherings with Friends and Family: Three times a week   Attends Religious Services: Never   Active Member of Clubs or Organizations: No   Attends Music therapist: Never   Marital Status: Married    Tobacco Counseling Ready to quit: Not Answered Counseling given: Not Answered   Clinical Intake:  Pre-visit preparation completed: Yes  Pain : No/denies pain     BMI - recorded: 23.26 Nutritional Status: BMI 25 -29 Overweight Nutritional Risks: None Diabetes: No  How often do you need to have someone help you when you read instructions, pamphlets, or other written materials from your doctor or pharmacy?: 1 -  Never  Diabetic?NO  Interpreter Needed?: No  Information entered by :: MJ Timofey Carandang, LPN   Activities of Daily Living In your present state of health, do you have any difficulty performing the following activities: 03/18/2021 11/16/2020  Hearing? [place]? N -  Difficulty concentrating or making decisions? N -  Walking or climbing stairs? N -  Dressing or bathing? N N  Doing errands, shopping? N -  Preparing Food and eating ? N -  Using the Toilet? N -  In the past six months, have you accidently leaked urine? N -  Do you have problems with loss of bowel control? N -  Managing your Medications? N -  Managing your Finances? N -  Housekeeping or managing your Housekeeping? N -  Some recent data might be hidden    Patient Care Team: Susy Frizzle, MD as PCP - General (Family Medicine) Herminio Commons, MD (Inactive) as Attending Physician (Cardiology) Danie Binder, MD (Inactive) as Consulting Physician (Gastroenterology)  Indicate any recent Medical Services you may have received from other than [facility] providers in the past year (date may be approximate).     Assessment:   This is a routine wellness examination for [place].  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 Vision Screening - Comments:: Glasses. [employer]. 2021  Dietary issues and exercise activities discussed: Current Exercise Habits: Home exercise routine, Type of exercise: walking, Time (Minutes): 20, Frequency (Times/Week): 3, Weekly Exercise (Minutes/Week): 60, Intensity: Mild, Exercise limited by: cardiac condition(s)   Goals Addressed             This Visit's Progress    Exercise 3x per week (30 min per time)       Increase as tolerated.       Depression Screen PHQ 2/9 Scores 03/18/2021 08/24/2020 04/02/2018 10/06/2017 06/14/2016 06/23/2015  PHQ - 2 Score 0 0 0 0 0 0  PHQ- 9 Score - - - - 0 -    Fall Risk Fall Risk  03/18/2021 08/24/2020 04/02/2018  10/06/2017 06/14/2016  Falls in the past year? 0 0 1 Yes No  Comment - - - - -  Number falls in past yr: 0 0 1 1 -  Injury with Fall? 0 0  0 No -  Risk for fall due to : No Fall Risks No Fall Risks - - -  Follow up Falls prevention discussed Falls evaluation completed Falls evaluation completed Falls evaluation completed -    FALL RISK PREVENTION PERTAINING TO THE HOME:  Any stairs in or around the home? No  If so, are there any without handrails? No  Home free of loose throw rugs in walkways, pet beds, electrical cords, etc? Yes  Adequate lighting in your home to reduce risk of falls? Yes   ASSISTIVE DEVICES UTILIZED TO PREVENT FALLS:  Life alert? No  Use of a cane, walker or w/c? Yes  Grab bars in the bathroom? Yes  Shower chair or bench in shower? Yes  Elevated toilet seat or a handicapped toilet? Yes   TIMED UP AND GO:  Was the test performed? No .  Phone visit  Cognitive Function:     6CIT Screen 03/18/2021  What Year? 0 points  What month? 0 points  What time? 0 points  Count back from 20 0 points  Months in reverse 0 points  Repeat phrase 0 points  Total Score 0    Immunizations Immunization History  Administered Date(s) Administered   Fluad Quad(high Dose 65+) 12/25/2018, 01/16/2020, 02/12/2021   Influenza Whole 01/25/2007   Influenza, High Dose Seasonal PF 01/10/2017   Influenza,inj,Quad PF,6+ Mos 11/13/2017   Moderna Sars-Covid-2 Vaccination 06/11/2019, 07/09/2019   Pneumococcal Conjugate-13 04/17/2013, 06/14/2016    TDAP status: Due, Education has been provided regarding the importance of this vaccine. Advised may receive this vaccine at local pharmacy or Health Dept. Aware to provide a copy of the vaccination record if obtained from local pharmacy or Health Dept. Verbalized acceptance and understanding.  Flu Vaccine status: Up to date  Pneumococcal vaccine status: Up to date  Covid-19 vaccine status: Completed vaccines  Qualifies for Shingles  Vaccine? Yes   Zostavax completed No   Shingrix Completed?: No.    Education has been provided regarding the importance of this vaccine. Patient has been advised to call insurance company to determine out of pocket expense if they have not yet received this vaccine. Advised may also receive vaccine at local pharmacy or Health Dept. Verbalized acceptance and understanding.  Screening Tests Health Maintenance  Topic Date Due   TETANUS/TDAP  Never done   Zoster Vaccines- Shingrix (1 of 2) Never done   Pneumonia Vaccine 66+ Years old (2 - PPSV23 if available, else PCV20) 06/14/2017   COVID-19 Vaccine (3 - Booster for Moderna series) 09/03/2019   INFLUENZA VACCINE  Completed   DEXA SCAN  Completed   Hepatitis C Screening  Completed   HPV VACCINES  Aged Out    Health Maintenance  Health Maintenance Due  Topic Date Due   TETANUS/TDAP  Never done   Zoster Vaccines- Shingrix (1 of 2) Never done   Pneumonia Vaccine 23+ Years old (2 - PPSV23 if available, else PCV20) 06/14/2017   COVID-19 Vaccine (3 - Booster for Moderna series) 09/03/2019    Colorectal cancer screening: No longer required.   Mammogram status: Ordered 03/18/2021. Pt provided with contact info and advised to call to schedule appt.   Bone Density status: Ordered 03/18/2021. Pt provided with contact info and advised to call to schedule appt.  Lung Cancer Screening: (Low Dose CT Chest recommended if Age 65-80 years, 30 pack-year currently smoking OR have quit w/in 15years.) does not qualify.   Additional Screening:  Hepatitis C Screening: does qualify; Completed 02/17/2014  Vision Screening: Recommended annual ophthalmology exams for early detection of glaucoma and other disorders of the eye. Is the patient up to date with their annual eye exam?  Yes  Who is the provider or what is the name of the office in which the patient attends annual eye exams? [facility] If pt is not established with a provider, would  they like to be referred to a provider to establish care? No .   Dental Screening: Recommended annual dental exams for proper oral hygiene  Community Resource Referral / Chronic Care Management: CRR required this visit?  No   CCM required this visit?  No      Plan:     I have personally reviewed and noted the following in the patients chart:   Medical and social history Use of alcohol, tobacco or illicit drugs  Current medications and supplements including opioid prescriptions. Patient is not currently taking opioid prescriptions. Functional ability and status Nutritional status Physical activity Advanced directives List of other physicians Hospitalizations, surgeries, and ER visits in previous 12 months Vitals Screenings to include cognitive, depression, and falls Referrals and appointments  In addition, I have reviewed and discussed with patient certain preventive protocols, quality metrics, and best practice recommendations. A written personalized care plan for preventive services as well as general preventive health recommendations were provided to patient.     Chriss Driver, LPN   7/48/2707   Nurse Notes: Discussed mammogram and bone density and pt would like to schedule a repeat of both. Order placed today. Discussed Shinrigix and how to obtain.

## 2021-03-18 NOTE — Patient Instructions (Signed)
[name] , Thank you for taking time to come for your Medicare Wellness Visit. I appreciate your ongoing commitment to your health goals. Please review the following plan we discussed and let me know if I can assist you in the future.   Screening recommendations/referrals: Colonoscopy: No longer required due to age. Mammogram: Order placed today. Bone Density: Order placed today.  Recommended yearly ophthalmology/optometry visit for glaucoma screening and checkup Recommended yearly dental visit for hygiene and checkup  Vaccinations: Influenza vaccine: Done 02/12/2021 Repeat annually  Pneumococcal vaccine: Done 04/17/2013 and 06/14/2016 Tdap vaccine: Due. Repeat in 10 years  Shingles vaccine: Shingrix discussed. Please contact your pharmacy for coverage information.     Covid-19:Done 06/11/2019 and 07/09/2019  Advanced directives: Please bring a copy of your health care power of attorney and living will to the office to be added to your chart at your convenience.   Conditions/risks identified: Aim for 30 minutes of exercise or brisk walking each day, drink 6-8 glasses of water and eat lots of fruits and vegetables. KEEP UP THE GOOD WORK!!   Next appointment: Follow up in one year for your annual wellness visit 2024.   Preventive Care 77 Years and Older, Female Preventive care refers to lifestyle choices and visits with your health care provider that can promote health and wellness. What does preventive care include? A yearly physical exam. This is also called an annual well check. Dental exams once or twice a year. Routine eye exams. Ask your health care provider how often you should have your eyes checked. Personal lifestyle choices, including: Daily care of your teeth and gums. Regular physical activity. Eating a healthy diet. Avoiding tobacco and drug use. Limiting alcohol use. Practicing safe sex. Taking low-dose aspirin every day. Taking vitamin and mineral supplements as  recommended by your health care provider. What happens during an annual well check? The services and screenings done by your health care provider during your annual well check will depend on your age, overall health, lifestyle risk factors, and family history of disease. Counseling  Your health care provider may ask you questions about your: Alcohol use. Tobacco use. Drug use. Emotional well-being. Home and relationship well-being. Sexual activity. Eating habits. History of falls. Memory and ability to understand (cognition). Work and work Statistician. Reproductive health. Screening  You may have the following tests or measurements: Height, weight, and BMI. Blood pressure. Lipid and cholesterol levels. These may be checked every 5 years, or more frequently if you are over 68 years old. Skin check. Lung cancer screening. You may have this screening every year starting at age 28 if you have a 30-pack-year history of smoking and currently smoke or have quit within the past 15 years. Fecal occult blood test (FOBT) of the stool. You may have this test every year starting at age 28. Flexible sigmoidoscopy or colonoscopy. You may have a sigmoidoscopy every 5 years or a colonoscopy every 10 years starting at age 43. Hepatitis C blood test. Hepatitis B blood test. Sexually transmitted disease (STD) testing. Diabetes screening. This is done by checking your blood sugar (glucose) after you have not eaten for a while (fasting). You may have this done every 1-3 years. Bone density scan. This is done to screen for osteoporosis. You may have this done starting at age 90. Mammogram. This may be done every 1-2 years. Talk to your health care provider about how often you should have regular mammograms. Talk with your health care provider about your test results, treatment  options, and if necessary, the need for more tests. Vaccines  Your health care provider may recommend certain vaccines, such  as: Influenza vaccine. This is recommended every year. Tetanus, diphtheria, and acellular pertussis (Tdap, Td) vaccine. You may need a Td booster every 10 years. Zoster vaccine. You may need this after age 78. Pneumococcal 13-valent conjugate (PCV13) vaccine. One dose is recommended after age 40. Pneumococcal polysaccharide (PPSV23) vaccine. One dose is recommended after age 75. Talk to your health care provider about which screenings and vaccines you need and how often you need them. This information is not intended to replace advice given to you by your health care provider. Make sure you discuss any questions you have with your health care provider. Document Released: 03/20/2015 Document Revised: 11/11/2015 Document Reviewed: 12/23/2014 Elsevier Interactive Patient Education  2017 [place] Prevention in the Home Falls can cause injuries. They can happen to people of all ages. There are many things you can do to make your home safe and to help prevent falls. What can I do on the outside of my home? Regularly fix the edges of walkways and driveways and fix any cracks. Remove anything that might make you trip as you walk through a door, such as a raised step or threshold. Trim any bushes or trees on the path to your home. Use bright outdoor lighting. Clear any walking paths of anything that might make someone trip, such as rocks or tools. Regularly check to see if handrails are loose or broken. Make sure that both sides of any steps have handrails. Any raised decks and porches should have guardrails on the edges. Have any leaves, snow, or ice cleared regularly. Use sand or salt on walking paths during winter. Clean up any spills in your garage right away. This includes oil or grease spills. What can I do in the bathroom? Use night lights. Install grab bars by the toilet and in the tub and shower. Do not use towel bars as grab bars. Use non-skid mats or decals in the tub or  shower. If you need to sit down in the shower, use a plastic, non-slip stool. Keep the floor dry. Clean up any water that spills on the floor as soon as it happens. Remove soap buildup in the tub or shower regularly. Attach bath mats securely with double-sided non-slip rug tape. Do not have throw rugs and other things on the floor that can make you trip. What can I do in the bedroom? Use night lights. Make sure that you have a light by your bed that is easy to reach. Do not use any sheets or blankets that are too big for your bed. They should not hang down onto the floor. Have a firm chair that has side arms. You can use this for support while you get dressed. Do not have throw rugs and other things on the floor that can make you trip. What can I do in the kitchen? Clean up any spills right away. Avoid walking on wet floors. Keep items that you use a lot in easy-to-reach places. If you need to reach something above you, use a strong step stool that has a grab bar. Keep electrical cords out of the way. Do not use floor polish or wax that makes floors slippery. If you must use wax, use non-skid floor wax. Do not have throw rugs and other things on the floor that can make you trip. What can I do with my stairs? Do not  leave any items on the stairs. Make sure that there are handrails on both sides of the stairs and use them. Fix handrails that are broken or loose. Make sure that handrails are as long as the stairways. Check any carpeting to make sure that it is firmly attached to the stairs. Fix any carpet that is loose or worn. Avoid having throw rugs at the top or bottom of the stairs. If you do have throw rugs, attach them to the floor with carpet tape. Make sure that you have a light switch at the top of the stairs and the bottom of the stairs. If you do not have them, ask someone to add them for you. What else can I do to help prevent falls? Wear shoes that: Do not have high heels. Have  rubber bottoms. Are comfortable and fit you well. Are closed at the toe. Do not wear sandals. If you use a stepladder: Make sure that it is fully opened. Do not climb a closed stepladder. Make sure that both sides of the stepladder are locked into place. Ask someone to hold it for you, if possible. Clearly mark and make sure that you can see: Any grab bars or handrails. First and last steps. Where the edge of each step is. Use tools that help you move around (mobility aids) if they are needed. These include: Canes. Walkers. Scooters. Crutches. Turn on the lights when you go into a dark area. Replace any light bulbs as soon as they burn out. Set up your furniture so you have a clear path. Avoid moving your furniture around. If any of your floors are uneven, fix them. If there are any pets around you, be aware of where they are. Review your medicines with your doctor. Some medicines can make you feel dizzy. This can increase your chance of falling. Ask your doctor what other things that you can do to help prevent falls. This information is not intended to replace advice given to you by your health care provider. Make sure you discuss any questions you have with your health care provider. Document Released: 12/18/2008 Document Revised: 07/30/2015 Document Reviewed: 03/28/2014 Elsevier Interactive Patient Education  2017 [employer].

## 2021-03-19 ENCOUNTER — Other Ambulatory Visit: Payer: Self-pay

## 2021-03-19 DIAGNOSIS — E039 Hypothyroidism, unspecified: Secondary | ICD-10-CM

## 2021-03-23 ENCOUNTER — Other Ambulatory Visit: Payer: Medicare Other

## 2021-03-30 ENCOUNTER — Other Ambulatory Visit: Payer: Medicare Other

## 2021-04-20 ENCOUNTER — Other Ambulatory Visit: Payer: Self-pay | Admitting: Family Medicine

## 2021-04-29 ENCOUNTER — Other Ambulatory Visit: Payer: Self-pay | Admitting: Family Medicine

## 2021-05-03 ENCOUNTER — Telehealth: Payer: Self-pay | Admitting: Family Medicine

## 2021-05-03 NOTE — Chronic Care Management (AMB) (Signed)
°  Chronic Care Management   Note  05/03/2021 Name: Evelena Masci MRN: [id number] DOB: 08-26-1945  Aerielle Stoklosa is a 76 y.o. year old female who is a primary care patient of Pickard, Cammie Mcgee, MD. I reached out to [name] by phone today in response to a referral sent by Ms. Teddy Spike PCP, Susy Frizzle, MD.   Ms. Cerino was given information about Chronic Care Management services today including:  CCM service includes personalized support from designated clinical staff supervised by her physician, including individualized plan of care and coordination with other care providers 24/7 contact phone numbers for assistance for urgent and routine care needs. Service will only be billed when office clinical staff spend 20 minutes or more in a month to coordinate care. Only one practitioner may furnish and bill the service in a calendar month. The patient may stop CCM services at any time (effective at the end of the month) by phone call to the office staff.   Patient agreed to services and verbal consent obtained.   Follow up plan:   Tatjana Secretary/administrator

## 2021-05-26 NOTE — Progress Notes (Deleted)
? ?Chronic Care Management ?Pharmacy Note ? ?05/26/2021 ?Name:  Shari Branch MRN:  [id number] DOB:  [birth year] ? ?Summary: ?*** ? ?Recommendations/Changes made from today's visit: ?*** ? ?Plan: ?*** ? ? ?Subjective: ?Shari Branch is an 76 y.o. year old female who is a primary patient of Pickard, Cammie Mcgee, MD.  The CCM team was consulted for assistance with disease management and care coordination needs.   ? ?Engaged with patient by telephone for initial visit in response to provider referral for pharmacy case management and/or care coordination services.  ? ?Consent to Services:  ?The patient was given the following information about Chronic Care Management services today, agreed to services, and gave verbal consent: 1. CCM service includes personalized support from designated clinical staff supervised by the primary care provider, including individualized plan of care and coordination with other care providers 2. 24/7 contact phone numbers for assistance for urgent and routine care needs. 3. Service will only be billed when office clinical staff spend 20 minutes or more in a month to coordinate care. 4. Only one practitioner may furnish and bill the service in a calendar month. 5.The patient may stop CCM services at any time (effective at the end of the month) by phone call to the office staff. 6. The patient will be responsible for cost sharing (co-pay) of up to 20% of the service fee (after annual deductible is met). Patient agreed to services and consent obtained. ? ?Patient Care Team: ?Susy Frizzle, MD as PCP - General (Family Medicine) ?Herminio Commons, MD (Inactive) as Attending Physician (Cardiology) ?Fields, Marga Melnick, MD (Inactive) as Consulting Physician (Gastroenterology) ?Edythe Clarity, [facility] as Pharmacist (Pharmacist) ? ?Recent office visits:  ?02/19/21 [name], MD - Family Medicine - Trigeminal neuralgia - No notes available. ?  ?02/17/21 [name], MD - Family Medicine -  Trigeminal neuralgia - No notes available. ?  ?02/16/21 [name], MD - Family Medicine - Presence of right artificial hip joint - No notes available. ?  ?02/12/21 [name], MD - Hypothyroidism - Labs were ordered. Chest xray ordered. diclofenac Sodium (VOLTAREN) 1 % GEL prescribed. Follow up as scheduled.  ?  ?02/03/21 [name], MD - Family Medicine - Trigeminal neuralgia - No notes available. ?  ?01/25/21 [name], MD - Family Medicine - Trigeminal neuralgia - No notes available. ?  ?01/24/21 [name], MD - Family Medicine - Trigeminal neuralgia - No notes available. ?  ?  ?Recent consult visits:  ?03/05/21 Larena Glassman, MD - Orthopedics - Closed displaced fracture of right femoral neck - XR ordered for unilateral pelvis - Encouraged her to continue to walk to help strengthen her right leg, and improve her endurance. Follow up in 3 months.  ?  ?01/22/21 Larena Glassman, MD - Closed displaced fracture of right femoral neck - XR unilateral pelvis - No medication changes. Follow up in 6 weeks.  ?  ?12/16/20 Imran Hague - Pneumonia - No notes available.  ?  ?12/09/20 Irene Limbo - Pneumonia - No notes available ?  ?12/03/20 Ahmed Tejan-Sie - Shortness of breath - No notes available.  ?  ?12/02/20 Irene Limbo - Pneumonia - No notes available ?  ?  ?Hospital visits:  ?None in previous 6 months ? ? ?Objective: ? ?Lab Results  ?Component Value Date  ? CREATININE 0.44 (L) 02/12/2021  ? BUN 6 (L) 02/12/2021  ? EGFR 100 02/12/2021  ? GFRNONAA >60 11/21/2020  ? GFRAA 102 04/02/2018  ? NA 139 02/12/2021  ? K  3.7 02/12/2021  ? CALCIUM 8.7 02/12/2021  ? CO2 32 02/12/2021  ? GLUCOSE 84 02/12/2021  ? ? ?Lab Results  ?Component Value Date/Time  ? HGBA1C 4.7 (L) 10/08/2020 05:11 AM  ?  ?Last diabetic Eye exam: No results found for: HMDIABEYEEXA  ?Last diabetic Foot exam: No results found for: HMDIABFOOTEX  ? ?Lab Results  ?Component Value Date  ? CHOL 271 (H) 06/14/2016  ? HDL 48 (L) 06/14/2016  ? LDLCALC  181 (H) 06/14/2016  ? TRIG 209 (H) 06/14/2016  ? CHOLHDL 5.6 (H) 06/14/2016  ? ? ? ?  Latest Ref Rng & Units 02/12/2021  ?  2:56 PM 10/07/2020  ?  4:37 AM 04/02/2018  ?  3:02 PM  ?Hepatic Function  ?Total Protein 6.1 - 8.1 g/dL 5.8   6.6   6.4    ?Albumin 3.5 - 5.0 g/dL  3.7     ?AST 10 - 35 U/L 15   23   20    ?ALT 6 - 29 U/L 8   18   15    ?Alk Phosphatase 38 - 126 U/L  118     ?Total Bilirubin 0.2 - 1.2 mg/dL 0.5   1.6   0.4    ? ? ?Lab Results  ?Component Value Date/Time  ? TSH 13.45 (H) 02/12/2021 02:56 PM  ? TSH 1.30 04/02/2018 03:02 PM  ? FREET4 1.17 11/08/2008 03:06 AM  ? FREET4 1.18 09/17/2008 09:09 AM  ? ? ? ?  Latest Ref Rng & Units 02/12/2021  ?  2:56 PM 11/23/2020  ?  5:16 AM 11/21/2020  ?  5:11 AM  ?CBC  ?WBC 3.8 - 10.8 Thousand/uL 3.2      ?Hemoglobin 11.7 - 15.5 g/dL 11.2   9.8   10.0    ?Hematocrit 35.0 - 45.0 % 34.1   29.4   30.2    ?Platelets 140 - 400 Thousand/uL 182      ? ? ?No results found for: VD25OH ? ?Clinical ASCVD: {YES/NO:21197} ?The ASCVD Risk score (Arnett DK, et al., 2019) failed to calculate for the following reasons: ?  Cannot find a previous HDL lab ?  Cannot find a previous total cholesterol lab   ? ? ?  03/18/2021  ?  2:55 PM 08/24/2020  ?  2:46 PM 04/02/2018  ?  2:29 PM  ?Depression screen PHQ 2/9  ?Decreased Interest 0 0 0  ?Down, Depressed, Hopeless 0 0 0  ?PHQ - 2 Score 0 0 0  ?  ? ?***Other: (CHADS2VASc if Afib, MMRC or CAT for COPD, ACT, DEXA) ? ?Social History  ? ?Tobacco Use  ?Smoking Status Every Day  ? Packs/day: 1.50  ? Types: Cigarettes  ?Smokeless Tobacco Never  ? ?BP Readings from Last 3 Encounters:  ?02/12/21 113/80  ?11/23/20 (!) 114/52  ?11/12/20 120/78  ? ?Pulse Readings from Last 3 Encounters:  ?02/12/21 79  ?11/23/20 95  ?11/12/20 (!) 107  ? ?Wt Readings from Last 3 Encounters:  ?03/18/21 153 lb (69.4 kg)  ?02/12/21 153 lb 9.6 oz (69.7 kg)  ?01/22/21 166 lb (75.3 kg)  ? ?BMI Readings from Last 3 Encounters:  ?03/18/21 23.26 kg/m?  ?02/12/21 23.35 kg/m?  ?01/22/21 26.00  kg/m?  ? ? ?Assessment/Interventions: Review of patient past medical history, allergies, medications, health status, including review of consultants reports, laboratory and other test data, was performed as part of comprehensive evaluation and provision of chronic care management services.  ? ?SDOH:  (Social Determinants of Health) assessments and   interventions performed: {yes/no:20286} ? ?SDOH Screenings  ? ?Alcohol Screen: Low Risk   ? Last Alcohol Screening Score (AUDIT): 0  ?Depression (PHQ2-9): Low Risk   ? PHQ-2 Score: 0  ?Financial Resource Strain: Low Risk   ? Difficulty of Paying Living Expenses: Not hard at all  ?Food Insecurity: No Food Insecurity  ? Worried About Charity fundraiser in the Last Year: Never true  ? Ran Out of Food in the Last Year: Never true  ?Housing: Low Risk   ? Last Housing Risk Score: 0  ?Physical Activity: Insufficiently Active  ? Days of Exercise per Week: 3 days  ? Minutes of Exercise per Session: 20 min  ?Social Connections: Moderately Isolated  ? Frequency of Communication with Friends and Family: More than three times a week  ? Frequency of Social Gatherings with Friends and Family: Three times a week  ? Attends Religious Services: Never  ? Active Member of Clubs or Organizations: No  ? Attends Archivist Meetings: Never  ? Marital Status: Married  ?Stress: Stress Concern Present  ? Feeling of Stress : To some extent  ?Tobacco Use: High Risk  ? Smoking Tobacco Use: Every Day  ? Smokeless Tobacco Use: Never  ? Passive Exposure: Not on file  ?Transportation Needs: No Transportation Needs  ? Lack of Transportation (Medical): No  ? Lack of Transportation (Non-Medical): No  ? ? ?CCM Care Plan ? ?Allergies  ?Allergen Reactions  ? Sulfonamide Derivatives Other (See Comments)  ?  REACTION: Joint Pain and locking - walks like a 76 year old  ? Sulfa Antibiotics   ? ? ?Medications Reviewed Today   ? ? Reviewed by Chriss Driver, LPN (Licensed Practical Nurse) on 03/18/21  at 1454  Med List Status: <None>  ? ?Medication Order Taking? Sig Documenting Provider Last Dose Status Informant  ?amitriptyline (ELAVIL) 50 MG tablet [id number] Yes TAKE 3 TABLETS BY MOUTH EVERY DAY AT BED

## 2021-05-31 ENCOUNTER — Ambulatory Visit (INDEPENDENT_AMBULATORY_CARE_PROVIDER_SITE_OTHER): Payer: Medicare Other | Admitting: Orthopedic Surgery

## 2021-05-31 ENCOUNTER — Other Ambulatory Visit: Payer: Self-pay

## 2021-05-31 ENCOUNTER — Ambulatory Visit (INDEPENDENT_AMBULATORY_CARE_PROVIDER_SITE_OTHER): Payer: Medicare Other

## 2021-05-31 ENCOUNTER — Telehealth: Payer: Self-pay | Admitting: Pharmacist

## 2021-05-31 ENCOUNTER — Encounter: Payer: Self-pay | Admitting: Orthopedic Surgery

## 2021-05-31 DIAGNOSIS — S72001A Fracture of unspecified part of neck of right femur, initial encounter for closed fracture: Secondary | ICD-10-CM

## 2021-05-31 MED ORDER — NAPROXEN 500 MG PO TABS: 500.0000 mg | ORAL_TABLET | Freq: Two times a day (BID) | ORAL | 0 refills | Status: DC

## 2021-05-31 NOTE — Progress Notes (Signed)
? ? ?Chronic Care Management ?Pharmacy Assistant  ? ?Name: Shari Branch  MRN: [id number] DOB: Feb 02, 1946 ? ? ?Reason for Encounter: Chart Prep for Initial Visit with CPP on 06/03/21 ?  ? ?Recent office visits:  ?02/19/21 Shari Luo, MD - Family Medicine - Trigeminal neuralgia - No notes available. ? ?02/17/21 Shari Luo, MD - Family Medicine - Trigeminal neuralgia - No notes available. ? ?02/16/21 Shari Luo, MD - Family Medicine - Presence of right artificial hip joint - No notes available. ? ?02/12/21 Shari Luo, MD - Hypothyroidism - Labs were ordered. Chest xray ordered. diclofenac Sodium (VOLTAREN) 1 % GEL prescribed. Follow up as scheduled.  ? ?02/03/21 Shari Luo, MD - Family Medicine - Trigeminal neuralgia - No notes available. ? ?01/25/21 Shari Luo, MD - Family Medicine - Trigeminal neuralgia - No notes available. ? ?01/24/21 Shari Luo, MD - Family Medicine - Trigeminal neuralgia - No notes available. ? ? ?Recent consult visits:  ?03/05/21 Shari Glassman, MD - Orthopedics - Closed displaced fracture of right femoral neck - XR ordered for unilateral pelvis - Encouraged her to continue to walk to help strengthen her right leg, and improve her endurance. Follow up in 3 months.  ? ?01/22/21 Shari Glassman, MD - Closed displaced fracture of right femoral neck - XR unilateral pelvis - No medication changes. Follow up in 6 weeks.  ? ?12/16/20 Shari Branch - Pneumonia - No notes available.  ? ?12/09/20 Shari Branch - Pneumonia - No notes available ? ?12/03/20 Shari Branch - Shortness of breath - No notes available.  ? ?12/02/20 Shari Branch - Pneumonia - No notes available ? ? ?Hospital visits:  ?None in previous 6 months ? ?Medications: ?Outpatient Encounter Medications as of 05/31/2021  ?Medication Sig  ? amitriptyline (ELAVIL) 50 MG tablet TAKE 3 TABLETS BY MOUTH EVERY DAY AT BEDTIME  ? atorvastatin (LIPITOR) 20 MG tablet Take 1 tablet (20 mg total) by mouth daily.  ? diclofenac Sodium  (VOLTAREN) 1 % GEL Apply 2 g topically 4 (four) times daily.  ? escitalopram (LEXAPRO) 20 MG tablet Take 1 tablet by mouth once daily  ? furosemide (LASIX) 20 MG tablet Take 1 tablet (20 mg total) by mouth daily as needed (leg swelling).  ? levothyroxine (SYNTHROID) 125 MCG tablet Take 1 tablet by mouth once daily  ? metoprolol tartrate (LOPRESSOR) 25 MG tablet Take 1 tablet (25 mg total) by mouth 2 (two) times daily.  ? pantoprazole (PROTONIX) 40 MG tablet Take 1 tablet by mouth twice daily  ? ?No facility-administered encounter medications on file as of 05/31/2021.  ? ? ?Have you seen any other providers since your last visit? no ? ?Any changes in your medications or health? no ? ?Any side effects from any medications? no ? ?Do you have an symptoms or problems not managed by your medications? No ? ?Any concerns about your health right now? no ? ?Has your provider asked that you check blood pressure, blood sugar, or follow special diet at home? no ? ?Do you get any type of exercise on a regular basis? Yes. Patient cares for husband who is in a wheelchair.  ? ?Can you think of a goal you would like to reach for your health?  ? ?Do you have any problems getting your medications? no ? ?Is there anything that you would like to discuss during the appointment? Patient did not have anything she could add at this time.  ? ?Please bring medications and supplements to appointment. Patient confirmed appointment date and  time.  ? ? ?Care Gaps ? ?AWV: done 03/05/21 ?Colonoscopy: unknown  ?DM Eye Exam: N/A ?DM Foot Exam:  N/A ?Microalbumin: N/A ?HbgAIC: done 10/08/20 (4.7) ?DEXA: done 07/07/15 (has been ordered) ?Mammogram: done 07/13/16 (has been ordered) ? ? ?Star Rating Drugs: ?Atorvastatin (LIPITOR) 20 MG tablet - last filled 12/15/20 90 days  ? ? ?Future Appointments  ?Date Time Provider [place]  ?05/31/2021  2:30 PM Mordecai Rasmussen, MD OCR-OCR None  ?06/03/2021  9:00 AM [facility]-CCM PHARMACIST [facility]  ?03/24/2022  2:45  PM [facility]-NURSE HEALTH ADVISOR [facility]  ? ? ? ?Shari Branch, CCMA ?Clinical Pharmacist Assistant  ?[phone] ? ?

## 2021-05-31 NOTE — Progress Notes (Signed)
Orthopaedic Postop Note ? ?Assessment: ?Shari Branch is a 76 y.o. 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 ? ?Plan: ?Shari Branch continues to improve.  She is ambulating without assistive device.  She is having occasional pains, primarily related to her activities.  As such, she is asking for a prescription for naproxen.  This was provided to her today.  She continues to take care of her ailing husband.  I have asked her to remain safe, and make sure that she is being careful.  She stated her understanding.  Encouraged her to continue ambulating, in an attempt to get stronger.  I anticipate that her endurance will continue to improve.  All questions were answered, she is amenable to this plan.  Follow-up in approximately 6 months.  If she has any issues, she will contact the clinic. ? ? ? ?Follow-up: ?Return in about 6 months (around 12/01/2021). ?XR at next visit: AP pelvis, right hip ? ?Subjective: ? ?Chief Complaint  ?Patient presents with  ? Follow-up  ?  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  ? ? ? ?History of Present Illness: ?Shari Branch is a 76 y.o. female who presents following the above stated procedure.  Her most recent surgery was just over 6 months ago.  She is doing well overall.  She has occasional pain, and has had excellent relief with naproxen.  She continues to take care of her ailing husband.  He is essentially wheelchair-bound.  She states she does get tired with her activities, and is responsible for all chores around the house.  She denies numbness and tingling.  No issues with her surgical incisions.  She is not using an assistive device. ? ? ?Review of Systems: ?No fevers or chills ?No numbness or tingling ?No Chest Pain ?No shortness of breath ? ? ?Objective: ?There were no vitals taken for this visit. ? ?Physical  Exam: ? ?Elderly female.  No acute distress.  Alert and oriented. ? ?Slow and steady gait without assistive device. ? ?Right hip surgical incisions are healed.  No surrounding erythema or drainage.  No tenderness to palpation over the lateral hip.  She is able to maintain a straight leg raise.  She does have some atrophy of the right quadriceps.  Active motion intact in the TA/EHL.  Strength is 5/5.  No pain with axial loading.  She tolerates gentle range of motion of her right hip. ? ? ?IMAGING: ?I personally ordered and reviewed the following images: ? ?AP pelvis and x-rays of the right hip were obtained in clinic today.  These were compared to prior x-rays.  The right hip is reduced.  There is been no interval subsidence of the implants.  Hip hemiarthroplasty prosthesis in good position.  No acute injuries are noted.  Overall alignment remains unchanged. ? ?Impression: Right hip hemiarthroplasty in stable position. ? ?Shari Rasmussen, MD ?05/31/2021 ?2:49 PM ? ? ?

## 2021-06-03 ENCOUNTER — Telehealth: Payer: Medicare Other

## 2021-08-09 ENCOUNTER — Ambulatory Visit
Admission: RE | Admit: 2021-08-09 | Discharge: 2021-08-09 | Disposition: A | Discharge status: Home / Self Care | Payer: Self-pay | Source: Ambulatory Visit | Attending: Family Medicine | Admitting: Family Medicine

## 2021-08-09 ENCOUNTER — Inpatient Hospital Stay
Admission: RE | Admit: 2021-08-09 | Discharge: 2021-08-09 | Disposition: A | Discharge status: Home / Self Care | Payer: Self-pay | Source: Ambulatory Visit | Attending: Family Medicine | Admitting: Family Medicine

## 2021-08-09 ENCOUNTER — Other Ambulatory Visit: Payer: Self-pay | Admitting: Family Medicine

## 2021-08-09 DIAGNOSIS — Z1231 Encounter for screening mammogram for malignant neoplasm of breast: Secondary | ICD-10-CM

## 2021-08-16 ENCOUNTER — Ambulatory Visit (HOSPITAL_COMMUNITY)
Admission: RE | Admit: 2021-08-16 | Discharge: 2021-08-16 | Disposition: A | Discharge status: Home / Self Care | Payer: Medicare Other | Source: Ambulatory Visit | Attending: Family Medicine | Admitting: Family Medicine

## 2021-08-16 DIAGNOSIS — Z1231 Encounter for screening mammogram for malignant neoplasm of breast: Secondary | ICD-10-CM

## 2021-08-16 DIAGNOSIS — Z780 Asymptomatic menopausal state: Secondary | ICD-10-CM | POA: Diagnosis not present

## 2021-08-16 DIAGNOSIS — M810 Age-related osteoporosis without current pathological fracture: Secondary | ICD-10-CM | POA: Diagnosis not present

## 2021-08-16 DIAGNOSIS — M8589 Other specified disorders of bone density and structure, multiple sites: Secondary | ICD-10-CM | POA: Diagnosis not present

## 2021-08-17 ENCOUNTER — Encounter: Payer: Self-pay | Admitting: Family Medicine

## 2021-08-17 ENCOUNTER — Other Ambulatory Visit (HOSPITAL_COMMUNITY): Payer: Self-pay | Admitting: Neurosurgery

## 2021-08-17 ENCOUNTER — Other Ambulatory Visit: Payer: Self-pay | Admitting: Family Medicine

## 2021-08-17 ENCOUNTER — Other Ambulatory Visit: Payer: Self-pay | Admitting: Neurosurgery

## 2021-08-17 DIAGNOSIS — M810 Age-related osteoporosis without current pathological fracture: Secondary | ICD-10-CM | POA: Insufficient documentation

## 2021-08-17 DIAGNOSIS — D432 Neoplasm of uncertain behavior of brain, unspecified: Secondary | ICD-10-CM

## 2021-08-17 MED ORDER — ALENDRONATE SODIUM 70 MG PO TABS: 70.0000 mg | ORAL_TABLET | ORAL | 11 refills | Status: DC

## 2021-09-08 ENCOUNTER — Ambulatory Visit (HOSPITAL_COMMUNITY)
Admission: RE | Admit: 2021-09-08 | Discharge: 2021-09-08 | Disposition: A | Discharge status: Home / Self Care | Payer: Medicare Other | Source: Ambulatory Visit | Attending: Neurosurgery | Admitting: Neurosurgery

## 2021-09-08 DIAGNOSIS — D432 Neoplasm of uncertain behavior of brain, unspecified: Secondary | ICD-10-CM | POA: Insufficient documentation

## 2021-09-08 DIAGNOSIS — D329 Benign neoplasm of meninges, unspecified: Secondary | ICD-10-CM | POA: Diagnosis not present

## 2021-09-08 DIAGNOSIS — R220 Localized swelling, mass and lump, head: Secondary | ICD-10-CM | POA: Diagnosis not present

## 2021-09-08 MED ORDER — GADOBUTROL 1 MMOL/ML IV SOLN
7.0000 mL | Freq: Once | INTRAVENOUS | Status: AC | PRN
Start: 2021-09-08 — End: 2021-09-08
  Administered 2021-09-08: 7 mL via INTRAVENOUS

## 2021-10-18 DIAGNOSIS — D329 Benign neoplasm of meninges, unspecified: Secondary | ICD-10-CM | POA: Diagnosis not present

## 2021-10-18 DIAGNOSIS — D432 Neoplasm of uncertain behavior of brain, unspecified: Secondary | ICD-10-CM | POA: Diagnosis not present

## 2021-10-19 ENCOUNTER — Other Ambulatory Visit: Payer: Self-pay | Admitting: Family Medicine

## 2021-10-19 NOTE — Telephone Encounter (Signed)
Requested Prescriptions  Pending Prescriptions Disp Refills  . pantoprazole (PROTONIX) 40 MG tablet [Pharmacy Med Name: Pantoprazole Sodium 40 MG Oral Tablet Delayed Release] 180 tablet 0    Sig: Take 1 tablet by mouth twice daily     Gastroenterology: Proton Pump Inhibitors Passed - 10/19/2021 12:10 PM      Passed - Valid encounter within last 12 months    Recent Outpatient Visits          8 months ago Hypothyroidism, unspecified type   [place] Pickard, Cammie Mcgee, MD   1 year ago Cervical radiculopathy   [place] Susy Frizzle, MD   1 year ago Cervical radiculopathy   [place] [facility] Cammie Mcgee, MD   1 year ago Venous stasis dermatitis of both lower extremities   [place] Pickard, Cammie Mcgee, MD   2 years ago Frequent falls   [place] Pickard, Cammie Mcgee, MD

## 2021-11-19 ENCOUNTER — Other Ambulatory Visit: Payer: Self-pay | Admitting: Family Medicine

## 2021-11-29 ENCOUNTER — Other Ambulatory Visit: Payer: Self-pay | Admitting: Family Medicine

## 2021-11-30 ENCOUNTER — Telehealth: Payer: Self-pay | Admitting: Family Medicine

## 2021-11-30 NOTE — Telephone Encounter (Signed)
Received call from patient to request refills of  amitriptyline (ELAVIL) 50 MG tablet [[id number]]   levothyroxine (SYNTHROID) 125 MCG tablet   Pharmacy confirmed as:  [facility] [address], [place] - [address] [place], [place] [address]  Phone:  [phone]  Fax:  [phone]   LOV: 02/12/2021  Patient leaving 10/1 to go out of state and needs refills beforehand; stated she's completely out of the amitriptyline.  Please advise at [phone].

## 2021-12-01 ENCOUNTER — Ambulatory Visit: Payer: Medicare Other | Admitting: Orthopedic Surgery

## 2021-12-03 ENCOUNTER — Encounter: Payer: Self-pay | Admitting: Orthopedic Surgery

## 2021-12-03 ENCOUNTER — Ambulatory Visit (INDEPENDENT_AMBULATORY_CARE_PROVIDER_SITE_OTHER): Payer: Medicare Other

## 2021-12-03 ENCOUNTER — Ambulatory Visit (INDEPENDENT_AMBULATORY_CARE_PROVIDER_SITE_OTHER): Payer: Medicare Other | Admitting: Orthopedic Surgery

## 2021-12-03 DIAGNOSIS — S72001A Fracture of unspecified part of neck of right femur, initial encounter for closed fracture: Secondary | ICD-10-CM | POA: Diagnosis not present

## 2021-12-03 NOTE — Progress Notes (Signed)
Orthopaedic Postop Note  Assessment: Shari Branch is a 76 y.o. 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  She has no pain in her right hip.  She is using a cane to assist with her balance.  I am pleased with her progress.  Urged her to continue to be careful, and use the cane as needed.  Medications as needed.  She will contact clinic if she has any issues.   Follow-up: Return if symptoms worsen or fail to improve. XR at next visit: AP pelvis, right hip  Subjective:  Chief Complaint  Patient presents with   Routine Post Op    Rt hip 10/07/20 w/ revision 11/16/20     History of Present Illness: Shari Branch is a 76 y.o. female who presents following the above stated procedure.  The revision surgery was approximately 1 year ago.  She is not having any issues with the right hip.  She has no pain.  She is using a cane, but states it is for her balance.  She denies numbness and tingling.  No issues with her incision.    Review of Systems: No fevers or chills No numbness or tingling No Chest Pain No shortness of breath   Objective: There were no vitals taken for this visit.  Physical Exam:  Elderly female.  No acute distress.  Alert and oriented.  Steady gait, using a cane.  Right hip surgical incisions are healed.  No surrounding erythema or drainage.  No tenderness to palpation laterally.  She is able to maintain straight leg raise.  She tolerates gentle range of motion.  Toes warm and well-perfused.  Sensation is intact over the dorsum of her foot.   IMAGING: I personally ordered and reviewed the following images:  AP pelvis and right hip x-rays were obtained in clinic today.  These were compared to prior x-rays.  No acute injuries are noted.  Cemented right hip hemiarthroplasty in stable position.  No evidence of subsidence.  No lucencies  appreciated.  Impression: Right hip hemiarthroplasty in excellent position, without hardware failure  Mordecai Rasmussen, MD 12/03/2021 10:14 AM

## 2021-12-07 ENCOUNTER — Ambulatory Visit: Payer: Medicare Other | Admitting: Orthopedic Surgery

## 2022-01-19 ENCOUNTER — Other Ambulatory Visit: Payer: Self-pay

## 2022-01-19 DIAGNOSIS — K219 Gastro-esophageal reflux disease without esophagitis: Secondary | ICD-10-CM

## 2022-01-19 DIAGNOSIS — I10 Essential (primary) hypertension: Secondary | ICD-10-CM

## 2022-01-19 DIAGNOSIS — E782 Mixed hyperlipidemia: Secondary | ICD-10-CM

## 2022-01-20 ENCOUNTER — Telehealth: Payer: Self-pay | Admitting: *Deleted

## 2022-01-20 NOTE — Progress Notes (Unsigned)
  Care Coordination  Outreach Note  01/20/2022 Name: Antonique Langford MRN: [id number] DOB: Jul 27, 1945   Care Coordination Outreach Attempts: An unsuccessful telephone outreach was attempted today to offer the patient information about available care coordination services as a benefit of their health plan.   Follow Up Plan:  Additional outreach attempts will be made to offer the patient care coordination information and services.   Encounter Outcome:  No Answer  [place]  Direct Dial: [phone]

## 2022-01-25 NOTE — Progress Notes (Unsigned)
  Care Coordination  Outreach Note  01/25/2022 Name: Collen Hostler MRN: [id number] DOB: 1946-01-01   Care Coordination Outreach Attempts: A second unsuccessful outreach was attempted today to offer the patient with information about available care coordination services as a benefit of their health plan.     Follow Up Plan:  Additional outreach attempts will be made to offer the patient care coordination information and services.   Encounter Outcome:  No Answer  [place]  Direct Dial: [phone]

## 2022-01-26 NOTE — Progress Notes (Signed)
  Care Coordination  Outreach Note  01/26/2022 Name: Shari Branch MRN: [id number] DOB: 1945-05-03   Care Coordination Outreach Attempts: A third unsuccessful outreach was attempted today to offer the patient with information about available care coordination services as a benefit of their health plan.   Follow Up Plan:  No further outreach attempts will be made at this time. We have been unable to contact the patient to offer or enroll patient in care coordination services  Encounter Outcome:  No Answer  [place]: [phone]

## 2022-03-24 ENCOUNTER — Encounter: Payer: Self-pay | Admitting: Family Medicine

## 2022-03-24 ENCOUNTER — Ambulatory Visit (INDEPENDENT_AMBULATORY_CARE_PROVIDER_SITE_OTHER): Payer: Medicare Other | Admitting: Family Medicine

## 2022-03-24 VITALS — BP 126/72 | HR 100 | Temp 98.4°F | Ht 68.0 in | Wt 124.8 lb

## 2022-03-24 DIAGNOSIS — F17200 Nicotine dependence, unspecified, uncomplicated: Secondary | ICD-10-CM | POA: Diagnosis not present

## 2022-03-24 DIAGNOSIS — R002 Palpitations: Secondary | ICD-10-CM | POA: Diagnosis not present

## 2022-03-24 DIAGNOSIS — Z122 Encounter for screening for malignant neoplasm of respiratory organs: Secondary | ICD-10-CM | POA: Diagnosis not present

## 2022-03-24 DIAGNOSIS — R634 Abnormal weight loss: Secondary | ICD-10-CM | POA: Diagnosis not present

## 2022-03-24 MED ORDER — MIRTAZAPINE 30 MG PO TABS: 30.0000 mg | ORAL_TABLET | Freq: Every day | ORAL | 3 refills | Status: DC

## 2022-03-24 NOTE — Progress Notes (Signed)
Wt Readings from Last 3 Encounters:  03/24/22 124 lb 12.8 oz (56.6 kg)  03/18/21 153 lb (69.4 kg)  02/12/21 153 lb 9.6 oz (69.7 kg)     Subjective:    Patient ID: Shari Branch, female    DOB: 1945-09-23, 77 y.o.   MRN: [id number]  I have not seen the patient in over a year.  Since the last time I saw her she has lost more than 30 pounds.  She fell and injured her hip.  Then her brother-in-law and her husband both died.  She moved out [place] and live with family and when she returned, she was living alone.  She states that she had no appetite and no one else to cook for so she started eating less.  She was also dealing with the grief of losing several family members.  She believes this is the reason that her weight has dropped.  She states that she has little to no appetite.  She has a longtime history of smoking.  She denies any hemoptysis.  She does report dizziness.  She also reports palpitations.  Today she is in normal sinus rhythm with borderline tachycardia at 100 bpm however she states that suddenly at night her heart will start to race or flutter or skipped beats.  This occurs primarily when she sleeping on her left side.  She denies any sick.  She denies any pleurisy or hemoptysis.  She denies any angina.  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 Oneida Alar: poor prep, inadequate exam   COLONOSCOPY WITH  ESOPHAGOGASTRODUODENOSCOPY (EGD)  2009   Dr. Oneida Alar: good prep, normal TI, normal colon, hemorrhoids, gastritis, nodule in cardia (benign)   EAR PINNA RECONSTRUCTION W/ RIB GRAFT     7 surgeries starting at age 17   ESOPHAGOGASTRODUODENOSCOPY  2008   Dr. Oneida Alar: Reflux esophagitis, large hh, gastritis.    [name] EXCISION Right 09/19/2013   Procedure: EXCISION [name] FOOT;  Surgeon: Marcheta Grammes, DPM;  Location: [facility];  Service: Podiatry;  Laterality: Right;   HARDWARE REMOVAL Right 11/16/2020   Procedure: HARDWARE REMOVAL;  Surgeon: Mordecai Rasmussen, MD;  Location: [facility];  Service: Orthopedics;  Laterality: Right;  Removal of hardware from right hip; cannulated screws (Arthrex)   HIP ARTHROPLASTY Right 11/16/2020   Procedure: ARTHROPLASTY BIPOLAR HIP (HEMIARTHROPLASTY);  Surgeon: Mordecai Rasmussen, MD;  Location: [facility];  Service: Orthopedics;  Laterality: Right;   HIP PINNING,CANNULATED Right 10/07/2020   Procedure: CANNULATED HIP PINNING;  Surgeon: Mordecai Rasmussen, MD;  Location: [facility];  Service: Orthopedics;  Laterality: Right;  CRPP of right nondisplaced femoral neck fracture   OSTECTOMY Right 09/19/2013   Procedure: OSTECTOMY;  Surgeon: Marcheta Grammes, DPM;  Location: [facility];  Service:  Podiatry;  Laterality: Right;   RETINAL DETACHMENT SURGERY     bilateral   TONSILLECTOMY     Current Outpatient Medications on File Prior to Visit  Medication Sig Dispense Refill   atorvastatin (LIPITOR) 20 MG tablet Take 1 tablet (20 mg total) by mouth daily. 90 tablet 3   diclofenac Sodium (VOLTAREN) 1 % GEL Apply 2 g topically 4 (four) times daily. 100 g 1   levothyroxine (SYNTHROID) 125 MCG tablet Take 1 tablet by mouth once daily 90 tablet 0   metoprolol tartrate (LOPRESSOR) 25 MG tablet Take 1 tablet (25 mg total) by mouth 2 (two) times daily. 180 tablet 3   pantoprazole (PROTONIX) 40 MG tablet Take 1 tablet by mouth twice daily 180 tablet 0   No current  facility-administered medications on file prior to visit.   Allergies  Allergen Reactions   Sulfonamide Derivatives Other (See Comments)    REACTION: Joint Pain and locking - walks like a 77 year old   Sulfa Antibiotics    Social History   Socioeconomic History   Marital status: Married    Spouse name: Elenore Rota   Number of children: 3   Years of education: Not on file   Highest education level: Not on file  Occupational History   Occupation: PT - [employer]  Tobacco Use   Smoking status: Every Day    Packs/day: 1.50    Types: Cigarettes   Smokeless tobacco: Never  Vaping Use   Vaping Use: Never used  Substance and Sexual Activity   Alcohol use: No   Drug use: No   Sexual activity: Not on file  Other Topics Concern   Not on file  Social History Narrative   Married 35 years in 2022   Grandchildren and great grandchildren   Social Determinants of Health   Financial Resource Strain: Low Risk  (03/18/2021)   Overall Financial Resource Strain (CARDIA)    Difficulty of Paying Living Expenses: Not hard at all  Food Insecurity: No Food Insecurity (03/18/2021)   Hunger Vital Sign    Worried About Running Out of Food in the Last Year: Never true    [place] in the Last Year: Never true  Transportation Needs: No Transportation Needs (03/18/2021)   PRAPARE - Hydrologist (Medical): No    Lack of Transportation (Non-Medical): No  Physical Activity: Insufficiently Active (03/18/2021)   Exercise Vital Sign    Days of Exercise per Week: 3 days    Minutes of Exercise per Session: 20 min  Stress: Stress Concern Present (03/18/2021)   [place]    Feeling of Stress : To some extent  Social Connections: Moderately Isolated (03/18/2021)   Social Connection and Isolation Panel [NHANES]    Frequency of Communication with Friends and Family: More than three times a week    Frequency of Social  Gatherings with Friends and Family: Three times a week    Attends Religious Services: Never    Active Member of Clubs or Organizations: No    Attends Archivist Meetings: Never    Marital Status: Married  Human resources officer Violence: Not At Risk (03/18/2021)   Humiliation, Afraid, Rape, and Kick questionnaire    Fear of Current or Ex-Partner: No    Emotionally Abused: No    Physically Abused: No    Sexually Abused: No     Past Medical History:  Diagnosis Date   Anxiety  GERD 01/25/2007   Qualifier: Diagnosis of  By: [place] LPN, Kim     Hepatic steatosis    Hyperlipidemia    Liver hemangioma    Osteoporosis    Thyroid disease    Trigeminal neuralgia 03/08/1995   Trigemninal Tumor- Left, [employer]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 Oneida Alar: poor prep, inadequate exam   COLONOSCOPY WITH ESOPHAGOGASTRODUODENOSCOPY (EGD)  2009   Dr. Oneida Alar: good prep, normal TI, normal colon, hemorrhoids, gastritis, nodule in cardia (benign)   EAR PINNA RECONSTRUCTION W/ RIB GRAFT     7 surgeries starting at age 29   ESOPHAGOGASTRODUODENOSCOPY  2008   Dr. Oneida Alar: Reflux esophagitis, large hh, gastritis.    [name] EXCISION Right 09/19/2013   Procedure: EXCISION [name] FOOT;  Surgeon: Marcheta Grammes, DPM;  Location: [facility];  Service: Podiatry;  Laterality: Right;   HARDWARE REMOVAL Right 11/16/2020   Procedure: HARDWARE REMOVAL;  Surgeon: Mordecai Rasmussen, MD;  Location: [facility];  Service: Orthopedics;  Laterality: Right;  Removal of hardware from right hip; cannulated screws (Arthrex)   HIP ARTHROPLASTY Right 11/16/2020   Procedure: ARTHROPLASTY BIPOLAR HIP (HEMIARTHROPLASTY);  Surgeon: Mordecai Rasmussen, MD;  Location: [facility];  Service:  Orthopedics;  Laterality: Right;   HIP PINNING,CANNULATED Right 10/07/2020   Procedure: CANNULATED HIP PINNING;  Surgeon: Mordecai Rasmussen, MD;  Location: [facility];  Service: Orthopedics;  Laterality: Right;  CRPP of right nondisplaced femoral neck fracture   OSTECTOMY Right 09/19/2013   Procedure: OSTECTOMY;  Surgeon: Marcheta Grammes, DPM;  Location: [facility];  Service: Podiatry;  Laterality: Right;   RETINAL DETACHMENT SURGERY     bilateral   TONSILLECTOMY     Current Outpatient Medications on File Prior to Visit  Medication Sig Dispense Refill   atorvastatin (LIPITOR) 20 MG tablet Take 1 tablet (20 mg total) by mouth daily. 90 tablet 3   diclofenac Sodium (VOLTAREN) 1 % GEL Apply 2 g topically 4 (four) times daily. 100 g 1   levothyroxine (SYNTHROID) 125 MCG tablet Take 1 tablet by mouth once daily 90 tablet 0   metoprolol tartrate (LOPRESSOR) 25 MG tablet Take 1 tablet (25 mg total) by mouth 2 (two) times daily. 180 tablet 3   pantoprazole (PROTONIX) 40 MG tablet Take 1 tablet by mouth twice daily 180 tablet 0   No current facility-administered medications on file prior to visit.   Allergies  Allergen Reactions   Sulfonamide Derivatives Other (See Comments)    REACTION: Joint Pain and locking - walks like a 77 year old   Sulfa Antibiotics    Social History   Socioeconomic History   Marital status: Married    Spouse name: Elenore Rota   Number of children: 3   Years of education: Not on file   Highest education level: Not on file  Occupational History   Occupation: PT - [employer]  Tobacco Use   Smoking status: Every Day    Packs/day: 1.50    Types: Cigarettes   Smokeless tobacco: Never  Vaping Use   Vaping Use: Never used  Substance and Sexual Activity   Alcohol use: No   Drug use: No   Sexual  activity: Not on file  Other Topics Concern   Not on file  Social History Narrative   Married 52 years in 2022   Grandchildren and great grandchildren   Social Determinants of Health    Financial Resource Strain: Low Risk  (03/18/2021)   Overall Financial Resource Strain (CARDIA)    Difficulty of Paying Living Expenses: Not hard at all  Food Insecurity: No Food Insecurity (03/18/2021)   Hunger Vital Sign    Worried About Running Out of Food in the Last Year: Never true    Ran Out of Food in the Last Year: Never true  Transportation Needs: No Transportation Needs (03/18/2021)   PRAPARE - Hydrologist (Medical): No    Lack of Transportation (Non-Medical): No  Physical Activity: Insufficiently Active (03/18/2021)   Exercise Vital Sign    Days of Exercise per Week: 3 days    Minutes of Exercise per Session: 20 min  Stress: Stress Concern Present (03/18/2021)   [place]    Feeling of Stress : To some extent  Social Connections: Moderately Isolated (03/18/2021)   Social Connection and Isolation Panel [NHANES]    Frequency of Communication with Friends and Family: More than three times a week    Frequency of Social Gatherings with Friends and Family: Three times a week    Attends Religious Services: Never    Active Member of Clubs or Organizations: No    Attends Archivist Meetings: Never    Marital Status: Married  Human resources officer Violence: Not At Risk (03/18/2021)   Humiliation, Afraid, Rape, and Kick questionnaire    Fear of Current or Ex-Partner: No    Emotionally Abused: No    Physically Abused: No    Sexually Abused: No      Review of Systems  All other systems reviewed and are negative.      Objective:   Physical Exam Vitals reviewed.  Constitutional:      General: She is not in acute distress.    Appearance: She is well-developed. She is not diaphoretic.  Cardiovascular:     Rate and Rhythm: Normal rate and regular rhythm.     Heart sounds: Normal heart sounds. No murmur heard.    No gallop.  Pulmonary:     Effort: Pulmonary effort is normal.  No respiratory distress.     Breath sounds: No wheezing, rhonchi or rales.  Neurological:     Mental Status: She is alert and oriented to person, place, and time.     Cranial Nerves: No cranial nerve deficit.     Sensory: No sensory deficit.     Motor: No atrophy or abnormal muscle tone.     Coordination: Coordination abnormal.     Gait: Gait abnormal.     Deep Tendon Reflexes: Reflexes are normal and symmetric.           Assessment & Plan:  Weight loss - Plan: CBC with Differential/Platelet, COMPLETE METABOLIC PANEL WITH GFR, TSH, Sedimentation rate, Fecal Globin By Immunochemistry  Screening for lung cancer - Plan: CT CHEST LUNG CA SCREEN LOW DOSE W/O CM  Smoker - Plan: CT CHEST LUNG CA SCREEN LOW DOSE W/O CM  Palpitations - Plan: Ambulatory referral to Cardiology Patient appears very unsteady on her feet.  She also reports dizziness.  I am concerned that she is very dehydrated given the weight loss.  I encouraged her to try to push fluids.  I  will add Remeron 30 mg p.o. 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 Will do workup for weight loss by checking a CBC a CMP and a sed rate.  I will check her stool for blood to look for an occult GI malignancy.  I will check a TSH as I am concerned she may be taking too much thyroid medication.  I will schedule the patient for CT scan of the lung to screen for lung cancer.  Meanwhile, I will consult cardiology.  I believe the patient needs a 14-day monitor given the palpitations to rule out any cardiac arrhythmias.  Recheck the patient in 2 to 3 weeks or sooner if worse.

## 2022-03-25 LAB — COMPLETE METABOLIC PANEL WITH GFR
AG Ratio: 2.1 (calc) (ref 1.0–2.5)
ALT: 10 U/L (ref 6–29)
AST: 19 U/L (ref 10–35)
Albumin: 3.9 g/dL (ref 3.6–5.1)
Alkaline phosphatase (APISO): 91 U/L (ref 37–153)
BUN/Creatinine Ratio: 21 (calc) (ref 6–22)
BUN: 10 mg/dL (ref 7–25)
CO2: 32 mmol/L (ref 20–32)
Calcium: 9.3 mg/dL (ref 8.6–10.4)
Chloride: 102 mmol/L (ref 98–110)
Creat: 0.47 mg/dL — ABNORMAL LOW (ref 0.60–1.00)
Globulin: 1.9 g/dL (calc) (ref 1.9–3.7)
Glucose, Bld: 94 mg/dL (ref 65–99)
Potassium: 3.5 mmol/L (ref 3.5–5.3)
Sodium: 143 mmol/L (ref 135–146)
Total Bilirubin: 1.4 mg/dL — ABNORMAL HIGH (ref 0.2–1.2)
Total Protein: 5.8 g/dL — ABNORMAL LOW (ref 6.1–8.1)
eGFR: 98 mL/min/{1.73_m2} (ref >=60)

## 2022-03-25 LAB — CBC WITH DIFFERENTIAL/PLATELET
Absolute Monocytes: 143 cells/uL — ABNORMAL LOW (ref 200–950)
Basophils Absolute: 10 cells/uL (ref 0–200)
Basophils Relative: 0.3 %
Eosinophils Absolute: 20 cells/uL (ref 15–500)
Eosinophils Relative: 0.6 %
HCT: 35.6 % (ref 35.0–45.0)
Hemoglobin: 12.3 g/dL (ref 11.7–15.5)
Lymphs Abs: 435 cells/uL — ABNORMAL LOW (ref 850–3900)
MCH: 31.4 pg (ref 27.0–33.0)
MCHC: 34.6 g/dL (ref 32.0–36.0)
MCV: 90.8 fL (ref 80.0–100.0)
MPV: 11.2 fL (ref 7.5–12.5)
Monocytes Relative: 4.2 %
Neutro Abs: 2791 cells/uL (ref 1500–7800)
Neutrophils Relative %: 82.1 %
Platelets: 128 10*3/uL — ABNORMAL LOW (ref 140–400)
RBC: 3.92 10*6/uL (ref 3.80–5.10)
RDW: 12.6 % (ref 11.0–15.0)
Total Lymphocyte: 12.8 %
WBC: 3.4 10*3/uL — ABNORMAL LOW (ref 3.8–10.8)

## 2022-03-25 LAB — TSH: TSH: 0.01 mIU/L — ABNORMAL LOW (ref 0.40–4.50)

## 2022-03-25 LAB — SEDIMENTATION RATE: Sed Rate: 6 mm/h (ref 0–30)

## 2022-03-28 ENCOUNTER — Other Ambulatory Visit: Payer: Self-pay | Admitting: Family Medicine

## 2022-03-29 ENCOUNTER — Other Ambulatory Visit: Payer: Self-pay

## 2022-03-29 DIAGNOSIS — E039 Hypothyroidism, unspecified: Secondary | ICD-10-CM

## 2022-03-29 DIAGNOSIS — R634 Abnormal weight loss: Secondary | ICD-10-CM

## 2022-03-29 MED ORDER — LEVOTHYROXINE SODIUM 88 MCG PO TABS: 88.0000 ug | ORAL_TABLET | Freq: Every day | ORAL | 3 refills | Status: DC

## 2022-03-31 ENCOUNTER — Ambulatory Visit (INDEPENDENT_AMBULATORY_CARE_PROVIDER_SITE_OTHER): Payer: Medicare Other

## 2022-03-31 ENCOUNTER — Telehealth: Payer: Self-pay

## 2022-03-31 VITALS — Ht 68.0 in | Wt 124.0 lb

## 2022-03-31 DIAGNOSIS — Z0000 Encounter for general adult medical examination without abnormal findings: Secondary | ICD-10-CM | POA: Diagnosis not present

## 2022-03-31 DIAGNOSIS — E782 Mixed hyperlipidemia: Secondary | ICD-10-CM

## 2022-03-31 NOTE — Patient Instructions (Addendum)
Shari Branch , Thank you for taking time to come for your Medicare Wellness Visit. I appreciate your ongoing commitment to your health goals. Please review the following plan we discussed and let me know if I can assist you in the future.   These are the goals we discussed:  Goals      Exercise 3x per week (30 min per time)     Increase as tolerated.        This is a list of the screening recommended for you and due dates:  Health Maintenance  Topic Date Due   DTaP/Tdap/Td vaccine (1 - Tdap) Never done   COVID-19 Vaccine (3 - Moderna risk series) 04/16/2022*   Flu Shot  06/05/2022*   Zoster (Shingles) Vaccine (1 of 2) 06/30/2022*   Medicare Annual Wellness Visit  04/01/2023   Pneumonia Vaccine  Completed   DEXA scan (bone density measurement)  Completed   Hepatitis C Screening: USPSTF Recommendation to screen - Ages 81-79 yo.  Completed   HPV Vaccine  Aged Out  *Topic was postponed. The date shown is not the original due date.    Advanced directives: Please bring a copy of your health care power of attorney and living will to the office to be added to your chart at your convenience.   Conditions/risks identified: Aim for 30 minutes of exercise or brisk walking, 6-8 glasses of water, and 5 servings of fruits and vegetables each day.   Next appointment: Follow up in one year for your annual wellness visit    Preventive Care 65 Years and Older, Female Preventive care refers to lifestyle choices and visits with your health care provider that can promote health and wellness. What does preventive care include? A yearly physical exam. This is also called an annual well check. Dental exams once or twice a year. Routine eye exams. Ask your health care provider how often you should have your eyes checked. Personal lifestyle choices, including: Daily care of your teeth and gums. Regular physical activity. Eating a healthy diet. Avoiding tobacco and drug use. Limiting alcohol  use. Practicing safe sex. Taking low-dose aspirin every day. Taking vitamin and mineral supplements as recommended by your health care provider. What happens during an annual well check? The services and screenings done by your health care provider during your annual well check will depend on your age, overall health, lifestyle risk factors, and family history of disease. Counseling  Your health care provider may ask you questions about your: Alcohol use. Tobacco use. Drug use. Emotional well-being. Home and relationship well-being. Sexual activity. Eating habits. History of falls. Memory and ability to understand (cognition). Work and work Statistician. Reproductive health. Screening  You may have the following tests or measurements: Height, weight, and BMI. Blood pressure. Lipid and cholesterol levels. These may be checked every 5 years, or more frequently if you are over 59 years old. Skin check. Lung cancer screening. You may have this screening every year starting at age 76 if you have a 30-pack-year history of smoking and currently smoke or have quit within the past 15 years. Fecal occult blood test (FOBT) of the stool. You may have this test every year starting at age 82. Flexible sigmoidoscopy or colonoscopy. You may have a sigmoidoscopy every 5 years or a colonoscopy every 10 years starting at age 21. Hepatitis C blood test. Hepatitis B blood test. Sexually transmitted disease (STD) testing. Diabetes screening. This is done by checking your blood sugar (glucose) after you have not  eaten for a while (fasting). You may have this done every 1-3 years. Bone density scan. This is done to screen for osteoporosis. You may have this done starting at age 11. Mammogram. This may be done every 1-2 years. Talk to your health care provider about how often you should have regular mammograms. Talk with your health care provider about your test results, treatment options, and if necessary,  the need for more tests. Vaccines  Your health care provider may recommend certain vaccines, such as: Influenza vaccine. This is recommended every year. Tetanus, diphtheria, and acellular pertussis (Tdap, Td) vaccine. You may need a Td booster every 10 years. Zoster vaccine. You may need this after age 42. Pneumococcal 13-valent conjugate (PCV13) vaccine. One dose is recommended after age 71. Pneumococcal polysaccharide (PPSV23) vaccine. One dose is recommended after age 44. Talk to your health care provider about which screenings and vaccines you need and how often you need them. This information is not intended to replace advice given to you by your health care provider. Make sure you discuss any questions you have with your health care provider. Document Released: 03/20/2015 Document Revised: 11/11/2015 Document Reviewed: 12/23/2014 Elsevier Interactive Patient Education  2017 [employer] Prevention in the Home Falls can cause injuries. They can happen to people of all ages. There are many things you can do to make your home safe and to help prevent falls. What can I do on the outside of my home? Regularly fix the edges of walkways and driveways and fix any cracks. Remove anything that might make you trip as you walk through a door, such as a raised step or threshold. Trim any bushes or trees on the path to your home. Use bright outdoor lighting. Clear any walking paths of anything that might make someone trip, such as rocks or tools. Regularly check to see if handrails are loose or broken. Make sure that both sides of any steps have handrails. Any raised decks and porches should have guardrails on the edges. Have any leaves, snow, or ice cleared regularly. Use sand or salt on walking paths during winter. Clean up any spills in your garage right away. This includes oil or grease spills. What can I do in the bathroom? Use night lights. Install grab bars by the toilet and in the  tub and shower. Do not use towel bars as grab bars. Use non-skid mats or decals in the tub or shower. If you need to sit down in the shower, use a plastic, non-slip stool. Keep the floor dry. Clean up any water that spills on the floor as soon as it happens. Remove soap buildup in the tub or shower regularly. Attach bath mats securely with double-sided non-slip rug tape. Do not have throw rugs and other things on the floor that can make you trip. What can I do in the bedroom? Use night lights. Make sure that you have a light by your bed that is easy to reach. Do not use any sheets or blankets that are too big for your bed. They should not hang down onto the floor. Have a firm chair that has side arms. You can use this for support while you get dressed. Do not have throw rugs and other things on the floor that can make you trip. What can I do in the kitchen? Clean up any spills right away. Avoid walking on wet floors. Keep items that you use a lot in easy-to-reach places. If you need to reach  something above you, use a strong step stool that has a grab bar. Keep electrical cords out of the way. Do not use floor polish or wax that makes floors slippery. If you must use wax, use non-skid floor wax. Do not have throw rugs and other things on the floor that can make you trip. What can I do with my stairs? Do not leave any items on the stairs. Make sure that there are handrails on both sides of the stairs and use them. Fix handrails that are broken or loose. Make sure that handrails are as long as the stairways. Check any carpeting to make sure that it is firmly attached to the stairs. Fix any carpet that is loose or worn. Avoid having throw rugs at the top or bottom of the stairs. If you do have throw rugs, attach them to the floor with carpet tape. Make sure that you have a light switch at the top of the stairs and the bottom of the stairs. If you do not have them, ask someone to add them for  you. What else can I do to help prevent falls? Wear shoes that: Do not have high heels. Have rubber bottoms. Are comfortable and fit you well. Are closed at the toe. Do not wear sandals. If you use a stepladder: Make sure that it is fully opened. Do not climb a closed stepladder. Make sure that both sides of the stepladder are locked into place. Ask someone to hold it for you, if possible. Clearly mark and make sure that you can see: Any grab bars or handrails. First and last steps. Where the edge of each step is. Use tools that help you move around (mobility aids) if they are needed. These include: Canes. Walkers. Scooters. Crutches. Turn on the lights when you go into a dark area. Replace any light bulbs as soon as they burn out. Set up your furniture so you have a clear path. Avoid moving your furniture around. If any of your floors are uneven, fix them. If there are any pets around you, be aware of where they are. Review your medicines with your doctor. Some medicines can make you feel dizzy. This can increase your chance of falling. Ask your doctor what other things that you can do to help prevent falls. This information is not intended to replace advice given to you by your health care provider. Make sure you discuss any questions you have with your health care provider. Document Released: 12/18/2008 Document Revised: 07/30/2015 Document Reviewed: 03/28/2014 Elsevier Interactive Patient Education  2017 [employer].

## 2022-03-31 NOTE — Telephone Encounter (Signed)
Patient seen for AWV and states that she is confused about what medications she is supposed to take so she has stopped everything.  She would like to know should she resume the Levothyroxine, Metoprolol, and Atorvastatin.  And start the Remeron that was prescribed on 1/18?

## 2022-03-31 NOTE — Progress Notes (Signed)
Subjective:   Shari Branch is a 77 y.o. female who presents for Medicare Annual (Subsequent) preventive examination.  I connected with  [name] on 03/31/22 by a audio enabled telemedicine application and verified that I am speaking with the correct person using two identifiers.  Patient Location: Home  Provider Location: Office/Clinic  I discussed the limitations of evaluation and management by telemedicine. The patient expressed understanding and agreed to proceed.  Review of Systems     Cardiac Risk Factors include: advanced age (>87mn, >>21women);dyslipidemia     Objective:    Today's Vitals   03/31/22 1335  Weight: 124 lb (56.2 kg)  Height: '5\' 8"'$  (1.727 m)   Body mass index is 18.85 kg/m.     03/31/2022    1:47 PM 03/18/2021    3:06 PM 11/16/2020    2:10 PM 11/12/2020    1:38 PM 10/07/2020   11:31 AM 10/06/2020    4:00 PM 10/06/2020    1:06 AM  Advanced Directives  Does Patient Have a Medical Advance Directive? No Yes No No No No No  Type of ASocial research officer, governmentLiving will       Copy of [place] Chart?  No - copy requested       Would patient like information on creating a medical advance directive? Yes (MAU/Ambulatory/Procedural Areas - Information given)  Yes (Inpatient - patient defers creating a medical advance directive at this time - Information given) No - Patient declined No - Patient declined No - Patient declined No - Patient declined    Current Medications (verified) Outpatient Encounter Medications as of 03/31/2022  Medication Sig   atorvastatin (LIPITOR) 20 MG tablet Take 1 tablet (20 mg total) by mouth daily. (Patient not taking: Reported on 03/31/2022)   diclofenac Sodium (VOLTAREN) 1 % GEL Apply 2 g topically 4 (four) times daily. (Patient not taking: Reported on 03/31/2022)   levothyroxine (SYNTHROID) 88 MCG tablet Take 1 tablet (88 mcg total) by mouth daily. (Patient not taking: Reported on  03/31/2022)   metoprolol tartrate (LOPRESSOR) 25 MG tablet Take 1 tablet by mouth twice daily (Patient not taking: Reported on 03/31/2022)   mirtazapine (REMERON) 30 MG tablet Take 1 tablet (30 mg total) by mouth at bedtime. (Patient not taking: Reported on 03/31/2022)   pantoprazole (PROTONIX) 40 MG tablet Take 1 tablet by mouth twice daily (Patient not taking: Reported on 03/31/2022)   No facility-administered encounter medications on file as of 03/31/2022.    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 FOneida Alar poor prep, inadequate exam   COLONOSCOPY WITH ESOPHAGOGASTRODUODENOSCOPY (EGD)  2009   Dr. FOneida Alar good prep, normal TI, normal colon, hemorrhoids, gastritis, nodule in cardia (benign)   EAR PINNA RECONSTRUCTION W/  RIB GRAFT     7 surgeries starting at age 27   ESOPHAGOGASTRODUODENOSCOPY  2008   Dr. Oneida Alar: Reflux esophagitis, large hh, gastritis.    [name] EXCISION Right 09/19/2013   Procedure: EXCISION [name] FOOT;  Surgeon: Marcheta Grammes, DPM;  Location: [facility];  Service: Podiatry;  Laterality: Right;   HARDWARE REMOVAL Right 11/16/2020   Procedure: HARDWARE REMOVAL;  Surgeon: Mordecai Rasmussen, MD;  Location: [facility];  Service: Orthopedics;  Laterality: Right;  Removal of hardware from right hip; cannulated screws (Arthrex)   HIP ARTHROPLASTY Right 11/16/2020   Procedure:  ARTHROPLASTY BIPOLAR HIP (HEMIARTHROPLASTY);  Surgeon: Mordecai Rasmussen, MD;  Location: [facility];  Service: Orthopedics;  Laterality: Right;   HIP PINNING,CANNULATED Right 10/07/2020   Procedure: CANNULATED HIP PINNING;  Surgeon: Mordecai Rasmussen, MD;  Location: [facility];  Service: Orthopedics;  Laterality: Right;  CRPP of right nondisplaced femoral neck fracture   OSTECTOMY Right 09/19/2013   Procedure: OSTECTOMY;  Surgeon: Marcheta Grammes, DPM;  Location: [facility];  Service: Podiatry;  Laterality: Right;   RETINAL DETACHMENT SURGERY     bilateral   TONSILLECTOMY     Family History  Problem Relation Age of Onset   Colon cancer Paternal Grandmother 58   Pancreatic cancer Father        deceased age 70   Other Other        retinal detachment in multiple family member at early age   Social History   Socioeconomic History   Marital status: Married    Spouse name: Elenore Rota   Number of children: 3   Years of education: Not on file   Highest education level: Not on file  Occupational History   Occupation: PT - [employer]  Tobacco Use   Smoking status: Every Day    Packs/day: 1.50    Types: Cigarettes   Smokeless tobacco: Never  Vaping Use   Vaping Use: Never used  Substance and Sexual Activity   Alcohol use: No   Drug use: No   Sexual activity: Not on file  Other Topics Concern   Not on file  Social History Narrative   Married 7 years in 2022   Grandchildren and great grandchildren   Social Determinants of Health   Financial Resource Strain: Low Risk  (03/31/2022)   Overall Financial Resource Strain (CARDIA)    Difficulty of Paying Living Expenses: Not hard at all  Food Insecurity: No Food Insecurity (03/31/2022)   Hunger Vital Sign    Worried About Running Out of Food in the Last Year: Never true    [place] in the Last Year: Never true  Transportation Needs: No Transportation Needs (03/31/2022)   PRAPARE - Hydrologist (Medical): No    Lack of  Transportation (Non-Medical): No  Physical Activity: Insufficiently Active (03/31/2022)   Exercise Vital Sign    Days of Exercise per Week: 3 days    Minutes of Exercise per Session: 30 min  Stress: No Stress Concern Present (03/31/2022)   [place]    Feeling of Stress : Not at all  Social Connections: Moderately Isolated (03/31/2022)   Social Connection and Isolation Panel [NHANES]    Frequency of Communication with Friends and Family: More than three times a week    Frequency of Social Gatherings with Friends and Family: More than three times a week    Attends Religious Services: Never  Active Member of Clubs or Organizations: No    Attends Archivist Meetings: Never    Marital Status: Married    Tobacco Counseling Ready to quit: Not Answered Counseling given: Not Answered   Clinical Intake:  Pre-visit preparation completed: Yes  Pain : No/denies pain     Diabetes: No  How often do you need to have someone help you when you read instructions, pamphlets, or other written materials from your doctor or pharmacy?: 1 - Never  Diabetic?No   Interpreter Needed?: No  Information entered by :: Denman George LPN   Activities of Daily Living    03/31/2022    1:47 PM  In your present state of health, do you have any difficulty performing the following activities:  Hearing? 1  Vision? 0  Difficulty concentrating or making decisions? 0  Walking or climbing stairs? 0  Dressing or bathing? 0  Doing errands, shopping? 0  Preparing Food and eating ? N  Using the Toilet? N  In the past six months, have you accidently leaked urine? N  Do you have problems with loss of bowel control? N  Managing your Medications? N  Managing your Finances? N  Housekeeping or managing your Housekeeping? N    Patient Care Team: Susy Frizzle, MD as PCP - General (Family Medicine) Herminio Commons, MD  (Inactive) as Attending Physician (Cardiology) Danie Binder, MD (Inactive) as Consulting Physician (Gastroenterology) Edythe Clarity, [facility] as Pharmacist (Pharmacist) Ashok Pall, MD as Consulting Physician (Neurosurgery) Mordecai Rasmussen, MD as Consulting Physician (Orthopedic Surgery)  Indicate any recent Medical Services you may have received from other than [facility] providers in the past year (date may be approximate).     Assessment:   This is a routine wellness examination for [place].  Hearing/Vision screen Hearing Screening - Comments:: Deaf in right ear  Vision Screening - Comments:: Wears rx glasses - up to date with routine eye exams with [facility]   Dietary issues and exercise activities discussed: Current Exercise Habits: Home exercise routine, Type of exercise: walking, Time (Minutes): 30, Frequency (Times/Week): 3, Weekly Exercise (Minutes/Week): 90, Intensity: Mild   Goals Addressed   None    Depression Screen    03/31/2022    1:44 PM 03/24/2022   11:30 AM 03/18/2021    2:55 PM 08/24/2020    2:46 PM 04/02/2018    2:29 PM 10/06/2017   11:19 AM 06/14/2016    2:01 PM  PHQ 2/9 Scores  PHQ - 2 Score 0 0 0 0 0 0 0  PHQ- 9 Score       0    Fall Risk    03/31/2022    1:36 PM 03/24/2022   11:30 AM 03/18/2021    3:07 PM 08/24/2020    2:46 PM 04/02/2018    2:29 PM  Fall Risk   Falls in the past year? 0 0 0 0 1  Number falls in past yr: 0 0 0 0 1  Injury with Fall? 0 0 0 0 0  Risk for fall due to :  No Fall Risks No Fall Risks No Fall Risks   Follow up Falls prevention discussed;Education provided;Falls evaluation completed Falls prevention discussed Falls prevention discussed Falls evaluation completed Falls evaluation completed    FALL RISK PREVENTION PERTAINING TO THE HOME:  Any stairs in or around the home? No  If so, are there any without handrails? No  Home free of loose throw rugs in walkways, pet beds,  electrical cords, etc? Yes  Adequate  lighting in your home to reduce risk of falls? Yes   ASSISTIVE DEVICES UTILIZED TO PREVENT FALLS:  Life alert? No  Use of a cane, walker or w/c? No  Grab bars in the bathroom? Yes  Shower chair or bench in shower? No  Elevated toilet seat or a handicapped toilet? Yes   TIMED UP AND GO:  Was the test performed? No . Telephonic visit   Cognitive Function:        03/31/2022    1:47 PM 03/18/2021    3:10 PM  6CIT Screen  What Year? 0 points 0 points  What month? 0 points 0 points  What time? 0 points 0 points  Count back from 20 0 points 0 points  Months in reverse 0 points 0 points  Repeat phrase 0 points 0 points  Total Score 0 points 0 points    Immunizations Immunization History  Administered Date(s) Administered   Fluad Quad(high Dose 65+) 12/25/2018, 01/16/2020, 02/12/2021   Influenza Whole 01/25/2007   Influenza, High Dose Seasonal PF 01/10/2017   Influenza,inj,Quad PF,6+ Mos 11/13/2017   Moderna Sars-Covid-2 Vaccination 06/11/2019, 07/09/2019   Pneumococcal Conjugate-13 04/17/2013, 06/14/2016   Pneumococcal Polysaccharide-23 06/30/2010    TDAP status: Due, Education has been provided regarding the importance of this vaccine. Advised may receive this vaccine at local pharmacy or Health Dept. Aware to provide a copy of the vaccination record if obtained from local pharmacy or Health Dept. Verbalized acceptance and understanding.  Flu Vaccine status: Declined, Education has been provided regarding the importance of this vaccine but patient still declined. Advised may receive this vaccine at local pharmacy or Health Dept. Aware to provide a copy of the vaccination record if obtained from local pharmacy or Health Dept. Verbalized acceptance and understanding.  Pneumococcal vaccine status: Up to date  Covid-19 vaccine status: Information provided on how to obtain vaccines.   Qualifies for Shingles Vaccine? Yes   Zostavax completed No   Shingrix Completed?: No.     Education has been provided regarding the importance of this vaccine. Patient has been advised to call insurance company to determine out of pocket expense if they have not yet received this vaccine. Advised may also receive vaccine at local pharmacy or Health Dept. Verbalized acceptance and understanding.  Screening Tests Health Maintenance  Topic Date Due   DTaP/Tdap/Td (1 - Tdap) Never done   COVID-19 Vaccine (3 - Moderna risk series) 04/16/2022 (Originally 08/06/2019)   INFLUENZA VACCINE  06/05/2022 (Originally 10/05/2021)   Zoster Vaccines- Shingrix (1 of 2) 06/30/2022 (Originally 10/06/1964)   Medicare Annual Wellness (AWV)  04/01/2023   Pneumonia Vaccine 12+ Years old  Completed   DEXA SCAN  Completed   Hepatitis C Screening  Completed   HPV VACCINES  Aged Out    Health Maintenance  Health Maintenance Due  Topic Date Due   DTaP/Tdap/Td (1 - Tdap) Never done    Colorectal cancer screening: No longer required.   Mammogram status: No longer required due to age .  Bone Density status: Completed 08/16/21. Results reflect: Bone density results: OSTEOPOROSIS. Repeat every 2 years.  Lung Cancer Screening: (Low Dose CT Chest recommended if Age 76-80 years, 30 pack-year currently smoking OR have quit w/in 15years.) does qualify.   Lung Cancer Screening Referral: ordered 03/24/22  Additional Screening:  Hepatitis C Screening: does qualify; Completed 02/17/14  Vision Screening: Recommended annual ophthalmology exams for early detection of glaucoma and other disorders of the eye. Is  the patient up to date with their annual eye exam?  Yes  Who is the provider or what is the name of the office in which the patient attends annual eye exams? [facility]  If pt is not established with a provider, would they like to be referred to a provider to establish care? No .   Dental Screening: Recommended annual dental exams for proper oral hygiene  Community Resource Referral / Chronic  Care Management: CRR required this visit?  No   CCM required this visit?   Patient currently receiving services      Plan:     I have personally reviewed and noted the following in the patient's chart:   Medical and social history Use of alcohol, tobacco or illicit drugs  Current medications and supplements including opioid prescriptions. Patient is not currently taking opioid prescriptions. Functional ability and status Nutritional status Physical activity Advanced directives List of other physicians Hospitalizations, surgeries, and ER visits in previous 12 months Vitals Screenings to include cognitive, depression, and falls Referrals and appointments  In addition, I have reviewed and discussed with patient certain preventive protocols, quality metrics, and best practice recommendations. A written personalized care plan for preventive services as well as general preventive health recommendations were provided to patient.     Vanetta Mulders, [place]   09/15/1974   Due to this being a virtual visit, the after visit summary with patients personalized plan was offered to patient via mail or my-chart. per request, patient was mailed a copy of AVS  Nurse Notes: See telephone note with patient questions about medications

## 2022-04-01 MED ORDER — ATORVASTATIN CALCIUM 20 MG PO TABS: 20.0000 mg | ORAL_TABLET | Freq: Every day | ORAL | 0 refills | Status: DC

## 2022-04-01 NOTE — Telephone Encounter (Signed)
Patient made aware and plans to pick up medications today. Patient also needed refill of Atorvastatin. Refill sent to [facility]

## 2022-04-06 ENCOUNTER — Other Ambulatory Visit: Payer: Self-pay

## 2022-04-06 DIAGNOSIS — D1803 Hemangioma of intra-abdominal structures: Secondary | ICD-10-CM

## 2022-04-06 DIAGNOSIS — R17 Unspecified jaundice: Secondary | ICD-10-CM

## 2022-04-14 ENCOUNTER — Ambulatory Visit: Payer: Medicare HMO | Admitting: Family Medicine

## 2022-04-26 ENCOUNTER — Ambulatory Visit (HOSPITAL_COMMUNITY)
Admission: RE | Admit: 2022-04-26 | Discharge: 2022-04-26 | Disposition: A | Discharge status: Home / Self Care | Payer: Medicare Other | Source: Ambulatory Visit | Attending: Family Medicine | Admitting: Family Medicine

## 2022-04-26 DIAGNOSIS — R17 Unspecified jaundice: Secondary | ICD-10-CM

## 2022-04-26 DIAGNOSIS — D1803 Hemangioma of intra-abdominal structures: Secondary | ICD-10-CM

## 2022-04-26 DIAGNOSIS — Z9049 Acquired absence of other specified parts of digestive tract: Secondary | ICD-10-CM | POA: Diagnosis not present

## 2022-04-28 ENCOUNTER — Other Ambulatory Visit: Payer: Medicare Other

## 2022-04-28 DIAGNOSIS — E039 Hypothyroidism, unspecified: Secondary | ICD-10-CM

## 2022-04-28 DIAGNOSIS — D1803 Hemangioma of intra-abdominal structures: Secondary | ICD-10-CM | POA: Diagnosis not present

## 2022-04-28 DIAGNOSIS — R17 Unspecified jaundice: Secondary | ICD-10-CM

## 2022-04-28 DIAGNOSIS — R634 Abnormal weight loss: Secondary | ICD-10-CM

## 2022-04-29 LAB — BILIRUBIN, FRACTIONATED(TOT/DIR/INDIR)
Bilirubin, Direct: 0.3 mg/dL — ABNORMAL HIGH (ref 0.0–0.2)
Indirect Bilirubin: 0.6 mg/dL (calc) (ref 0.2–1.2)
Total Bilirubin: 0.9 mg/dL (ref 0.2–1.2)

## 2022-04-29 LAB — TSH: TSH: 6.02 mIU/L — ABNORMAL HIGH (ref 0.40–4.50)

## 2022-05-02 ENCOUNTER — Ambulatory Visit (INDEPENDENT_AMBULATORY_CARE_PROVIDER_SITE_OTHER): Payer: Medicare Other | Admitting: Family Medicine

## 2022-05-02 ENCOUNTER — Encounter: Payer: Self-pay | Admitting: Family Medicine

## 2022-05-02 VITALS — BP 120/70 | HR 104 | Temp 98.4°F | Ht 68.0 in | Wt 121.4 lb

## 2022-05-02 DIAGNOSIS — F17200 Nicotine dependence, unspecified, uncomplicated: Secondary | ICD-10-CM

## 2022-05-02 DIAGNOSIS — R634 Abnormal weight loss: Secondary | ICD-10-CM | POA: Diagnosis not present

## 2022-05-02 DIAGNOSIS — Z87891 Personal history of nicotine dependence: Secondary | ICD-10-CM

## 2022-05-02 MED ORDER — MIRTAZAPINE 30 MG PO TABS: 30.0000 mg | ORAL_TABLET | Freq: Every day | ORAL | 5 refills | Status: AC

## 2022-05-02 NOTE — Progress Notes (Signed)
Subjective:    Patient ID: Shari Branch, female    DOB: 09-10-1945, 77 y.o.   MRN: [id number]  03/24/22 I have not seen the patient in over a year.  Since the last time I saw her she has lost more than 30 pounds.  She fell and injured her hip.  Then her brother-in-law and her husband both died.  She moved out [place] and live with family and when she returned, she was living alone.  She states that she had no appetite and no one else to cook for so she started eating less.  She was also dealing with the grief of losing several family members.  She believes this is the reason that her weight has dropped.  She states that she has little to no appetite.  She has a longtime history of smoking.  She denies any hemoptysis.  She does report dizziness.  She also reports palpitations.  Today she is in normal sinus rhythm with borderline tachycardia at 100 bpm however she states that suddenly at night her heart will start to race or flutter or skipped beats.  This occurs primarily when she sleeping on her left side.  She denies any sick.  She denies any pleurisy or hemoptysis.  She denies any angina.  She denies any nausea or vomiting or diarrhea or melena or hematochezia.  At that  time, my plan was: Patient appears very unsteady on her feet.  She also reports dizziness.  I am concerned that she is very dehydrated given the weight loss.  I encouraged her to try to push fluids.  I will add Remeron 30 mg p.o. 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 Will do workup for weight loss by checking a CBC a CMP and a sed rate.  I will check her stool for blood to look for an occult GI malignancy.  I will check a TSH as I am concerned she may be taking too much thyroid medication.  I will schedule the patient for CT scan of the lung to screen for lung cancer.  Meanwhile, I will consult cardiology.  I believe the patient needs a 14-day  monitor given the palpitations to rule out any cardiac arrhythmias.  Recheck the patient in 2 to 3 weeks or sooner if worse.  05/02/22 Labs showed a TSH that was undetectable.  After reducing her levothyroxine 88 mcg a day, she states that the palpitations and racing heart rate has essentially stopped.  She is feeling much better from that standpoint Wt Readings from Last 3 Encounters:  05/02/22 121 lb 6.4 oz (55.1 kg)  03/31/22 124 lb (56.2 kg)  03/24/22 124 lb 12.8 oz (56.6 kg)   She continues to lose weight.  She is lost 3 pounds since her last visit.  Labs showed slightly elevated bilirubin.  As result I got a right upper quadrant ultrasound which showed hepatic steatosis but was otherwise unremarkable.  Patient states that one of her grandchildren have been told that they have Gill Bears syndrome so it is likely possible that this is the reason for her bilirubin to be elevated.  She is here today primarily concerned about the weight loss.  She is lost 30 pounds.  She is not taking Remeron as I discussed at her last visit.  She does feel that some of this could be due to depression.  In the last year  she has lost her husband and then her youngest brother with motor.  She states that she had desired the.  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 Oneida Alar: poor prep, inadequate exam   COLONOSCOPY WITH ESOPHAGOGASTRODUODENOSCOPY (EGD)  2009   Dr. Oneida Alar:  good prep, normal TI, normal colon, hemorrhoids, gastritis, nodule in cardia (benign)   EAR PINNA RECONSTRUCTION W/ RIB GRAFT     7 surgeries starting at age 43   ESOPHAGOGASTRODUODENOSCOPY  2008   Dr. Oneida Alar: Reflux esophagitis, large hh, gastritis.    [name] EXCISION Right 09/19/2013   Procedure: EXCISION [name] FOOT;  Surgeon: Marcheta Grammes, DPM;  Location: [facility];  Service: Podiatry;  Laterality: Right;   HARDWARE REMOVAL Right 11/16/2020   Procedure: HARDWARE REMOVAL;  Surgeon: Mordecai Rasmussen, MD;  Location: [facility];  Service: Orthopedics;  Laterality: Right;  Removal of hardware from right hip; cannulated screws (Arthrex)   HIP ARTHROPLASTY Right 11/16/2020   Procedure: ARTHROPLASTY BIPOLAR HIP (HEMIARTHROPLASTY);  Surgeon: Mordecai Rasmussen, MD;  Location: [facility];  Service: Orthopedics;  Laterality: Right;   HIP PINNING,CANNULATED Right 10/07/2020   Procedure: CANNULATED HIP PINNING;  Surgeon: Mordecai Rasmussen, MD;  Location: [facility];  Service: Orthopedics;  Laterality: Right;  CRPP of right nondisplaced femoral neck fracture   OSTECTOMY Right 09/19/2013   Procedure: OSTECTOMY;  Surgeon: Marcheta Grammes, DPM;  Location: [facility];  Service: Podiatry;  Laterality: Right;   RETINAL DETACHMENT SURGERY     bilateral   TONSILLECTOMY     Current Outpatient Medications on File Prior to Visit  Medication Sig Dispense Refill   atorvastatin (LIPITOR) 20 MG tablet Take 1 tablet (20 mg total) by mouth daily. 90 tablet 0   No current facility-administered medications on file prior to visit.   Allergies  Allergen Reactions   Sulfonamide Derivatives Other (See Comments)    REACTION: Joint Pain and locking - walks like a 77 year old   Sulfa Antibiotics    Social History   Socioeconomic History   Marital status: Married    Spouse name: Elenore Rota   Number of children: 3   Years of education: Not on file   Highest education level: Not on file  Occupational History   Occupation:  PT - [employer]  Tobacco Use   Smoking status: Every Day    Packs/day: 1.50    Types: Cigarettes   Smokeless tobacco: Never  Vaping Use   Vaping Use: Never used  Substance and Sexual Activity   Alcohol use: No   Drug use: No   Sexual activity: Not on file  Other Topics Concern   Not on file  Social History Narrative   Married 54 years in 2022   Grandchildren and great grandchildren   Social Determinants of Engineer, drilling  Resource Strain: Low Risk  (03/31/2022)   Overall Financial Resource Strain (CARDIA)    Difficulty of Paying Living Expenses: Not hard at all  Food Insecurity: No Food Insecurity (03/31/2022)   Hunger Vital Sign    Worried About Running Out of Food in the Last Year: Never true    Ran Out of Food in the Last Year: Never true  Transportation Needs: No Transportation Needs (03/31/2022)   PRAPARE - Hydrologist (Medical): No    Lack of Transportation (Non-Medical): No  Physical Activity: Insufficiently Active (03/31/2022)   Exercise Vital Sign    Days of Exercise per Week: 3 days    Minutes of Exercise per Session: 30 min  Stress: No Stress Concern Present (03/31/2022)   [place]    Feeling of Stress : Not at all  Social Connections: Moderately Isolated (03/31/2022)   Social Connection and Isolation Panel [NHANES]    Frequency of Communication with Friends and Family: More than three times a week    Frequency of Social Gatherings with Friends and Family: More than three times a week    Attends Religious Services: Never    Marine scientist or Organizations: No    Attends Archivist Meetings: Never    Marital Status: Married  Human resources officer Violence: Not At Risk (03/31/2022)   Humiliation, Afraid, Rape, and Kick questionnaire    Fear of Current or Ex-Partner: No    Emotionally Abused: No    Physically Abused: No    Sexually Abused: No     Past  Medical History:  Diagnosis Date   Anxiety    GERD 01/25/2007   Qualifier: Diagnosis of  By: [place] LPN, Kim     Hepatic steatosis    Hyperlipidemia    Liver hemangioma    Osteoporosis    Thyroid disease    Trigeminal neuralgia 03/08/1995   Trigemninal Tumor- Left, [employer]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 Oneida Alar: poor prep, inadequate exam   COLONOSCOPY WITH ESOPHAGOGASTRODUODENOSCOPY (EGD)  2009   Dr. Oneida Alar: good prep, normal TI, normal colon, hemorrhoids, gastritis, nodule in cardia (benign)   EAR PINNA RECONSTRUCTION W/ RIB GRAFT     7 surgeries starting at age 59   ESOPHAGOGASTRODUODENOSCOPY  2008   Dr. Oneida Alar: Reflux esophagitis, large hh, gastritis.    [name] EXCISION Right 09/19/2013   Procedure: EXCISION [name] FOOT;  Surgeon: Marcheta Grammes, DPM;  Location: [facility];  Service: Podiatry;  Laterality: Right;   HARDWARE REMOVAL Right 11/16/2020   Procedure: HARDWARE REMOVAL;  Surgeon: Mordecai Rasmussen, MD;  Location: [facility];  Service: Orthopedics;  Laterality: Right;  Removal of hardware from right hip; cannulated screws (Arthrex)   HIP ARTHROPLASTY Right 11/16/2020   Procedure: ARTHROPLASTY BIPOLAR HIP (HEMIARTHROPLASTY);  Surgeon: Mordecai Rasmussen, MD;  Location: [facility];  Service: Orthopedics;  Laterality: Right;   HIP PINNING,CANNULATED Right 10/07/2020   Procedure: CANNULATED HIP PINNING;  Surgeon: Mordecai Rasmussen, MD;  Location: [facility];  Service: Orthopedics;  Laterality: Right;  CRPP of right nondisplaced femoral neck fracture   OSTECTOMY Right 09/19/2013  Procedure: OSTECTOMY;  Surgeon: Marcheta Grammes, DPM;  Location: [facility];  Service: Podiatry;  Laterality: Right;   RETINAL DETACHMENT SURGERY     bilateral    TONSILLECTOMY     Current Outpatient Medications on File Prior to Visit  Medication Sig Dispense Refill   atorvastatin (LIPITOR) 20 MG tablet Take 1 tablet (20 mg total) by mouth daily. 90 tablet 0   No current facility-administered medications on file prior to visit.   Allergies  Allergen Reactions   Sulfonamide Derivatives Other (See Comments)    REACTION: Joint Pain and locking - walks like a 77 year old   Sulfa Antibiotics    Social History   Socioeconomic History   Marital status: Married    Spouse name: Elenore Rota   Number of children: 3   Years of education: Not on file   Highest education level: Not on file  Occupational History   Occupation: PT - [employer]  Tobacco Use   Smoking status: Every Day    Packs/day: 1.50    Types: Cigarettes   Smokeless tobacco: Never  Vaping Use   Vaping Use: Never used  Substance and Sexual Activity   Alcohol use: No   Drug use: No   Sexual activity: Not on file  Other Topics Concern   Not on file  Social History Narrative   Married 24 years in 2022   Grandchildren and great grandchildren   Social Determinants of Health   Financial Resource Strain: Low Risk  (03/31/2022)   Overall Financial Resource Strain (CARDIA)    Difficulty of Paying Living Expenses: Not hard at all  Food Insecurity: No Food Insecurity (03/31/2022)   Hunger Vital Sign    Worried About Running Out of Food in the Last Year: Never true    [place] in the Last Year: Never true  Transportation Needs: No Transportation Needs (03/31/2022)   PRAPARE - Hydrologist (Medical): No    Lack of Transportation (Non-Medical): No  Physical Activity: Insufficiently Active (03/31/2022)   Exercise Vital Sign    Days of Exercise per Week: 3 days    Minutes of Exercise per Session: 30 min  Stress: No Stress Concern Present (03/31/2022)   [place]    Feeling of Stress : Not at  all  Social Connections: Moderately Isolated (03/31/2022)   Social Connection and Isolation Panel [NHANES]    Frequency of Communication with Friends and Family: More than three times a week    Frequency of Social Gatherings with Friends and Family: More than three times a week    Attends Religious Services: Never    Marine scientist or Organizations: No    Attends Archivist Meetings: Never    Marital Status: Married  Human resources officer Violence: Not At Risk (03/31/2022)   Humiliation, Afraid, Rape, and Kick questionnaire    Fear of Current or Ex-Partner: No    Emotionally Abused: No    Physically Abused: No    Sexually Abused: No      Review of Systems  All other systems reviewed and are negative.      Objective:   Physical Exam Vitals reviewed.  Constitutional:      General: She is not in acute distress.    Appearance: She is well-developed. She is not diaphoretic.  Cardiovascular:     Rate and Rhythm: Normal rate and regular rhythm.     Heart sounds:  Normal heart sounds. No murmur heard.    No gallop.  Pulmonary:     Effort: Pulmonary effort is normal. No respiratory distress.     Breath sounds: No wheezing, rhonchi or rales.  Neurological:     Mental Status: She is alert and oriented to person, place, and time.     Cranial Nerves: No cranial nerve deficit.     Sensory: No sensory deficit.     Motor: No atrophy or abnormal muscle tone.     Coordination: Coordination abnormal.     Gait: Gait abnormal.     Deep Tendon Reflexes: Reflexes are normal and symmetric.           Assessment & Plan:  Weight loss I truly believe her weight loss is due to poor appetite and depression.  Start Remeron 30 mg p.o. nightly.  Per CT scan of the abdomen and pelvis to rule out underlying malignancy.  Recheck TSH in 6 weeks

## 2022-05-06 ENCOUNTER — Other Ambulatory Visit: Payer: Self-pay | Admitting: Family Medicine

## 2022-05-06 MED ORDER — LEVOTHYROXINE SODIUM 100 MCG PO TABS: 100.0000 ug | ORAL_TABLET | Freq: Every day | ORAL | 3 refills | Status: AC

## 2022-05-13 ENCOUNTER — Telehealth: Payer: Self-pay | Admitting: Pharmacist

## 2022-05-13 NOTE — Progress Notes (Signed)
Care Management & Coordination Services Pharmacy Team  Reason for Encounter: Hypertension  Contacted patient to discuss hypertension disease state. Unsuccessful outreach. Left voicemail for patient to return call.    Current antihypertensive regimen:  No prescription therapy at this time.   Patient verbally confirms she is taking the above medications as directed.   How often are you checking your Blood Pressure?   she checks her blood pressure   taking her medication.  Current home BP readings:   DATE:             BP               PULSE   Wrist or arm cuff: Caffeine intake: Salt intake: OTC medications including pseudoephedrine or NSAIDs?  Any readings above 180/100?  If yes any symptoms of hypertensive emergency?   What recent interventions/DTPs have been made by any provider to improve Blood Pressure control since last CPP Visit:  Patient denies any recent medication changes.   Any recent hospitalizations or ED visits since last visit with CPP? No  What diet changes have been made to improve Blood Pressure Control?  Patient reported limiting her sodium intake in her diet.   What exercise is being done to improve your Blood Pressure Control?  Patient tries to remain as active as possible.   Adherence Review: Is the patient currently on ACE/ARB medication? No Does the patient have >5 day gap between last estimated fill dates? No  Star Rating Drugs:  Medication:   Last Fill: Day Supply  atorvastatin 20 MG tablet  04/01/22 90  Chart Updates: Recent office visits:  05/02/22 [name], MD - Family Medicine - Weight loss - CT abdomen and chest ordered. Remeron 30 mg p.o. nightly prescribed. TSH ordered follow up in 6 weeks.   03/24/22 [name], MD - Family Medicine - Weight loss - Labs were ordered. Consult called in to cardiology about possible 14 day cardiac monitoring. Follow up in 2-3 weeks.   Recent consult visits:  12/03/21 Larena Glassman, MD - Closed  right femoral fracture - Hip imaging ordered. Follow up as scheduled.   Hospital visits:  None in previous 6 months  Medications: Outpatient Encounter Medications as of 05/13/2022  Medication Sig   atorvastatin (LIPITOR) 20 MG tablet Take 1 tablet (20 mg total) by mouth daily.   levothyroxine (SYNTHROID) 100 MCG tablet Take 1 tablet (100 mcg total) by mouth daily.   mirtazapine (REMERON) 30 MG tablet Take 1 tablet (30 mg total) by mouth at bedtime.   No facility-administered encounter medications on file as of 05/13/2022.    Recent Office Vitals: BP Readings from Last 3 Encounters:  05/02/22 120/70  03/24/22 126/72  02/12/21 113/80   Pulse Readings from Last 3 Encounters:  05/02/22 (!) 104  03/24/22 100  02/12/21 79    Wt Readings from Last 3 Encounters:  05/02/22 121 lb 6.4 oz (55.1 kg)  03/31/22 124 lb (56.2 kg)  03/24/22 124 lb 12.8 oz (56.6 kg)     Kidney Function Lab Results  Component Value Date/Time   CREATININE 0.47 (L) 03/24/2022 11:54 AM   CREATININE 0.44 (L) 02/12/2021 02:56 PM   GFRNONAA >60 11/21/2020 05:11 AM   GFRNONAA 88 04/02/2018 03:02 PM   GFRAA 102 04/02/2018 03:02 PM       Latest Ref Rng & Units 03/24/2022   11:54 AM 02/12/2021    2:56 PM 11/21/2020    5:11 AM  BMP  Glucose 65 - 99 mg/dL  94  84  101   BUN 7 - 25 mg/dL 10  6  11    Creatinine 0.60 - 1.00 mg/dL 0.47  0.44  0.48   BUN/Creat Ratio 6 - 22 (calc) 21  14    Sodium 135 - 146 mmol/L 143  139  137   Potassium 3.5 - 5.3 mmol/L 3.5  3.7  3.8   Chloride 98 - 110 mmol/L 102  100  104   CO2 20 - 32 mmol/L 32  32  31   Calcium 8.6 - 10.4 mg/dL 9.3  8.7  8.4      Future Appointments  Date Time Provider [place]  07/04/2022 11:30 AM Mallipeddi, Quenten Raven, MD [facility] [place] [facility]  04/06/2023 11:30 AM [facility]-NURSE HEALTH ADVISOR [facility] [place] [place]

## 2022-05-25 ENCOUNTER — Other Ambulatory Visit: Payer: Self-pay | Admitting: Family Medicine

## 2022-06-06 DIAGNOSIS — H169 Unspecified keratitis: Secondary | ICD-10-CM | POA: Diagnosis not present

## 2022-06-06 DIAGNOSIS — Z961 Presence of intraocular lens: Secondary | ICD-10-CM | POA: Diagnosis not present

## 2022-06-06 DIAGNOSIS — H35373 Puckering of macula, bilateral: Secondary | ICD-10-CM | POA: Diagnosis not present

## 2022-07-04 ENCOUNTER — Ambulatory Visit: Payer: Medicare Other | Attending: Internal Medicine | Admitting: Internal Medicine

## 2022-07-04 NOTE — Progress Notes (Signed)
Erroneous encounter - please disregard.

## 2022-07-07 ENCOUNTER — Encounter: Payer: Self-pay | Admitting: Internal Medicine

## 2022-07-08 ENCOUNTER — Other Ambulatory Visit: Payer: Self-pay | Admitting: Family Medicine

## 2022-07-08 DIAGNOSIS — E782 Mixed hyperlipidemia: Secondary | ICD-10-CM

## 2022-07-25 ENCOUNTER — Telehealth: Payer: Self-pay | Admitting: Family Medicine

## 2022-07-25 NOTE — Telephone Encounter (Signed)
Copy of [facility] immunization received today.

## 2022-07-25 NOTE — Telephone Encounter (Signed)
Patient came to the office to notify provider she's still losing weight; she weighed in at 110 lbs last week and went down from size 8 to size 6.   Her family will be moving her to [place] the first of June and will send an ROI when she finds a provider in that area.   Patient also requested a copy of her immunization records from [facility] to take with her.  Please advise at [phone].

## 2022-07-26 ENCOUNTER — Other Ambulatory Visit: Payer: Self-pay | Admitting: Family Medicine

## 2022-07-26 DIAGNOSIS — R634 Abnormal weight loss: Secondary | ICD-10-CM

## 2022-07-27 NOTE — Progress Notes (Signed)
LM FOR PT TO CALL BACK TO DISCUSS CT ORDER. MJP,LPN

## 2022-08-12 ENCOUNTER — Telehealth: Payer: Self-pay | Admitting: Family Medicine

## 2022-08-12 NOTE — Telephone Encounter (Signed)
Patient returned missed call; stated she moved to [facility], [place] to be with her son 1 week ago. She will request for her records to be transferred when she connects with a new provider.

## 2023-10-13 ENCOUNTER — Other Ambulatory Visit (HOSPITAL_COMMUNITY): Payer: Self-pay | Admitting: Neurosurgery

## 2023-10-13 DIAGNOSIS — D432 Neoplasm of uncertain behavior of brain, unspecified: Secondary | ICD-10-CM
